# Patient Record
Sex: Male | Born: 1970 | Race: Black or African American | Hispanic: No | Marital: Single | Smoking: Current every day smoker
Health system: Southern US, Community
[De-identification: ages and names within clinical notes are randomized; demographics above are authoritative.]

## PROBLEM LIST (undated history)

## (undated) DIAGNOSIS — D7581 Myelofibrosis: Secondary | ICD-10-CM

## (undated) DIAGNOSIS — D47Z9 Other specified neoplasms of uncertain behavior of lymphoid, hematopoietic and related tissue: Secondary | ICD-10-CM

## (undated) DIAGNOSIS — Z113 Encounter for screening for infections with a predominantly sexual mode of transmission: Secondary | ICD-10-CM

## (undated) DIAGNOSIS — N179 Acute kidney failure, unspecified: Secondary | ICD-10-CM

## (undated) DIAGNOSIS — R05 Cough: Secondary | ICD-10-CM

## (undated) DIAGNOSIS — I10 Essential (primary) hypertension: Secondary | ICD-10-CM

## (undated) DIAGNOSIS — R161 Splenomegaly, not elsewhere classified: Secondary | ICD-10-CM

## (undated) DIAGNOSIS — B009 Herpesviral infection, unspecified: Secondary | ICD-10-CM

## (undated) DIAGNOSIS — Z789 Other specified health status: Secondary | ICD-10-CM

## (undated) DIAGNOSIS — N529 Male erectile dysfunction, unspecified: Secondary | ICD-10-CM

## (undated) DIAGNOSIS — M545 Low back pain: Secondary | ICD-10-CM

## (undated) DIAGNOSIS — S71012A Laceration without foreign body, left hip, initial encounter: Secondary | ICD-10-CM

## (undated) DIAGNOSIS — J45909 Unspecified asthma, uncomplicated: Secondary | ICD-10-CM

## (undated) DIAGNOSIS — M542 Cervicalgia: Secondary | ICD-10-CM

## (undated) DIAGNOSIS — M25512 Pain in left shoulder: Secondary | ICD-10-CM

## (undated) HISTORY — PX: MANDIBLE FRACTURE SURGERY: SHX706

## (undated) HISTORY — DX: Myelofibrosis: D75.81

## (undated) HISTORY — DX: Other specified neoplasms of uncertain behavior of lymphoid, hematopoietic and related tissue: D47.Z9

## (undated) HISTORY — DX: Herpesviral infection, unspecified: B00.9

## (undated) HISTORY — DX: Splenomegaly, not elsewhere classified: R16.1

## (undated) HISTORY — DX: Unspecified asthma, uncomplicated: J45.909

## (undated) HISTORY — DX: Male erectile dysfunction, unspecified: N52.9
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
## 2005-11-17 ENCOUNTER — Emergency Department (HOSPITAL_COMMUNITY): Admission: EM | Admit: 2005-11-17 | Discharge: 2005-11-17 | Payer: Self-pay | Admitting: Emergency Medicine

## 2006-01-05 ENCOUNTER — Emergency Department (HOSPITAL_COMMUNITY): Admission: EM | Admit: 2006-01-05 | Discharge: 2006-01-05 | Payer: Self-pay | Admitting: Emergency Medicine

## 2007-07-12 ENCOUNTER — Emergency Department (HOSPITAL_COMMUNITY): Admission: EM | Admit: 2007-07-12 | Discharge: 2007-07-12 | Payer: Self-pay | Admitting: Emergency Medicine

## 2007-12-18 ENCOUNTER — Emergency Department (HOSPITAL_COMMUNITY): Admission: EM | Admit: 2007-12-18 | Discharge: 2007-12-19 | Payer: Self-pay | Admitting: Emergency Medicine

## 2008-04-29 ENCOUNTER — Emergency Department (HOSPITAL_COMMUNITY): Admission: EM | Admit: 2008-04-29 | Discharge: 2008-04-29 | Payer: Self-pay | Admitting: Emergency Medicine

## 2010-11-06 ENCOUNTER — Emergency Department (HOSPITAL_COMMUNITY)
Admission: EM | Admit: 2010-11-06 | Discharge: 2010-11-07 | Payer: Self-pay | Source: Home / Self Care | Admitting: Emergency Medicine

## 2010-11-07 ENCOUNTER — Ambulatory Visit: Payer: Self-pay | Admitting: Oncology

## 2010-11-07 LAB — CBC
HCT: 46.6 % (ref 39.0–52.0)
Hemoglobin: 15 g/dL (ref 13.0–17.0)
MCH: 25.2 pg — ABNORMAL LOW (ref 26.0–34.0)
MCHC: 32.2 g/dL (ref 30.0–36.0)
MCV: 78.2 fL (ref 78.0–100.0)
Platelets: 393 10*3/uL (ref 150–400)
RBC: 5.96 MIL/uL — ABNORMAL HIGH (ref 4.22–5.81)
RDW: 16.5 % — ABNORMAL HIGH (ref 11.5–15.5)
WBC: 15.7 10*3/uL — ABNORMAL HIGH (ref 4.0–10.5)

## 2010-11-07 LAB — BASIC METABOLIC PANEL
BUN: 6 mg/dL (ref 6–23)
CO2: 28 mEq/L (ref 19–32)
Calcium: 9 mg/dL (ref 8.4–10.5)
Chloride: 103 mEq/L (ref 96–112)
Creatinine, Ser: 1.17 mg/dL (ref 0.4–1.5)
GFR calc Af Amer: >60 mL/min (ref >60)
GFR calc non Af Amer: >60 mL/min (ref >60)
Glucose, Bld: 87 mg/dL (ref 70–99)
Potassium: 3.7 mEq/L (ref 3.5–5.1)
Sodium: 138 mEq/L (ref 135–145)

## 2010-11-07 LAB — URINALYSIS, ROUTINE W REFLEX MICROSCOPIC
Bilirubin Urine: NEGATIVE
Hgb urine dipstick: NEGATIVE
Ketones, ur: NEGATIVE mg/dL
Nitrite: NEGATIVE
Protein, ur: NEGATIVE mg/dL
Specific Gravity, Urine: 1.018 (ref 1.005–1.030)
Urine Glucose, Fasting: NEGATIVE mg/dL
Urobilinogen, UA: 1 mg/dL (ref 0.0–1.0)
pH: 6 (ref 5.0–8.0)

## 2010-11-07 LAB — DIFFERENTIAL
Basophils Absolute: 0.2 10*3/uL — ABNORMAL HIGH (ref 0.0–0.1)
Basophils Relative: 1 % (ref 0–1)
Eosinophils Absolute: 0.3 10*3/uL (ref 0.0–0.7)
Eosinophils Relative: 2 % (ref 0–5)
Lymphocytes Relative: 6 % — ABNORMAL LOW (ref 12–46)
Lymphs Abs: 0.9 10*3/uL (ref 0.7–4.0)
Monocytes Absolute: 0.3 10*3/uL (ref 0.1–1.0)
Monocytes Relative: 2 % — ABNORMAL LOW (ref 3–12)
Neutro Abs: 14 10*3/uL — ABNORMAL HIGH (ref 1.7–7.7)
Neutrophils Relative %: 89 % — ABNORMAL HIGH (ref 43–77)

## 2010-11-07 LAB — HEPATIC FUNCTION PANEL
ALT: 31 U/L (ref 0–53)
AST: 34 U/L (ref 0–37)
Albumin: 3.7 g/dL (ref 3.5–5.2)
Alkaline Phosphatase: 113 U/L (ref 39–117)
Bilirubin, Direct: <0.1 mg/dL (ref 0.0–0.3)
Total Bilirubin: 0.8 mg/dL (ref 0.3–1.2)
Total Protein: 6.8 g/dL (ref 6.0–8.3)

## 2010-11-07 LAB — LIPASE, BLOOD: Lipase: 32 U/L (ref 11–59)

## 2010-11-08 ENCOUNTER — Other Ambulatory Visit
Admission: RE | Admit: 2010-11-08 | Discharge: 2010-11-08 | Payer: Self-pay | Source: Home / Self Care | Attending: Oncology | Admitting: Oncology

## 2010-11-08 LAB — MORPHOLOGY: PLT EST: ADEQUATE

## 2010-11-08 LAB — CBC WITH DIFFERENTIAL/PLATELET
BASO%: 0.1 % (ref 0.0–2.0)
Basophils Absolute: 0 10*3/uL (ref 0.0–0.1)
EOS%: 1.2 % (ref 0.0–7.0)
Eosinophils Absolute: 0.2 10*3/uL (ref 0.0–0.5)
HCT: 45.6 % (ref 38.4–49.9)
HGB: 14.7 g/dL (ref 13.0–17.1)
LYMPH%: 5.3 % — ABNORMAL LOW (ref 14.0–49.0)
MCH: 25.1 pg — ABNORMAL LOW (ref 27.2–33.4)
MCHC: 32.2 g/dL (ref 32.0–36.0)
MCV: 78.1 fL — ABNORMAL LOW (ref 79.3–98.0)
MONO#: 0.1 10*3/uL (ref 0.1–0.9)
MONO%: 0.8 % (ref 0.0–14.0)
NEUT#: 12 10*3/uL — ABNORMAL HIGH (ref 1.5–6.5)
NEUT%: 92.6 % — ABNORMAL HIGH (ref 39.0–75.0)
Platelets: 405 10*3/uL — ABNORMAL HIGH (ref 140–400)
RBC: 5.83 10*6/uL — ABNORMAL HIGH (ref 4.20–5.82)
RDW: 16.7 % — ABNORMAL HIGH (ref 11.0–14.6)
WBC: 13 10*3/uL — ABNORMAL HIGH (ref 4.0–10.3)
lymph#: 0.7 10*3/uL — ABNORMAL LOW (ref 0.9–3.3)

## 2010-11-08 LAB — CHCC SMEAR

## 2010-11-09 LAB — PATHOLOGIST SMEAR REVIEW

## 2010-11-09 LAB — FLOW CYTOMETRY

## 2010-11-09 LAB — MONONUCLEOSIS SCREEN: Mono Screen: NEGATIVE

## 2010-11-10 LAB — COMPREHENSIVE METABOLIC PANEL
ALT: 25 U/L (ref 0–53)
AST: 22 U/L (ref 0–37)
Albumin: 4.2 g/dL (ref 3.5–5.2)
Alkaline Phosphatase: 112 U/L (ref 39–117)
BUN: 9 mg/dL (ref 6–23)
CO2: 29 mEq/L (ref 19–32)
Calcium: 8.9 mg/dL (ref 8.4–10.5)
Chloride: 103 mEq/L (ref 96–112)
Creatinine, Ser: 1.24 mg/dL (ref 0.40–1.50)
Glucose, Bld: 121 mg/dL — ABNORMAL HIGH (ref 70–99)
Potassium: 3.7 mEq/L (ref 3.5–5.3)
Sodium: 140 mEq/L (ref 135–145)
Total Bilirubin: 0.7 mg/dL (ref 0.3–1.2)
Total Protein: 6.9 g/dL (ref 6.0–8.3)

## 2010-11-10 LAB — HEPATITIS B SURFACE ANTIBODY,QUALITATIVE: Hep B S Ab: NEGATIVE

## 2010-11-10 LAB — CYTOMEGALOVIRUS ANTIBODY, IGG: Cytomegalovirus Ab-IgG: 0.74 (ref <0.90)

## 2010-11-10 LAB — CMV IGM: CMV IgM: 0.22 (ref <0.90)

## 2010-11-10 LAB — PROTEIN ELECTROPHORESIS, SERUM
Albumin ELP: 58.7 % (ref 55.8–66.1)
Alpha-1-Globulin: 5 % — ABNORMAL HIGH (ref 2.9–4.9)
Alpha-2-Globulin: 8.5 % (ref 7.1–11.8)
Beta 2: 5.1 % (ref 3.2–6.5)
Beta Globulin: 6.2 % (ref 4.7–7.2)
Gamma Globulin: 16.5 % (ref 11.1–18.8)
Total Protein, Serum Electrophoresis: 6.9 g/dL (ref 6.0–8.3)

## 2010-11-10 LAB — HIV ANTIBODY (ROUTINE TESTING W REFLEX): HIV: NONREACTIVE

## 2010-11-10 LAB — ANGIOTENSIN CONVERTING ENZYME: Angiotensin 1 CE: 40 U/L (ref 8–52)

## 2010-11-10 LAB — LACTATE DEHYDROGENASE: LDH: 904 U/L — ABNORMAL HIGH (ref 94–250)

## 2010-11-10 LAB — HEPATITIS B CORE ANTIBODY, TOTAL: Hep B Core Total Ab: NEGATIVE

## 2010-11-10 LAB — HEPATITIS B CORE ANTIBODY, IGM: Hep B C IgM: NEGATIVE

## 2010-11-10 LAB — HEPATITIS C ANTIBODY: HCV Ab: NEGATIVE

## 2010-11-10 LAB — HEPATITIS C RNA QUANTITATIVE

## 2010-11-10 LAB — HEPATITIS B SURFACE ANTIGEN: Hepatitis B Surface Ag: NEGATIVE

## 2010-11-11 LAB — BCR/ABL (LIO MMD)

## 2010-11-16 ENCOUNTER — Ambulatory Visit (HOSPITAL_COMMUNITY)
Admission: RE | Admit: 2010-11-16 | Discharge: 2010-11-16 | Payer: Self-pay | Source: Home / Self Care | Attending: Oncology | Admitting: Oncology

## 2010-11-16 LAB — CBC
HCT: 43.8 % (ref 39.0–52.0)
Hemoglobin: 14.3 g/dL (ref 13.0–17.0)
MCH: 25.3 pg — ABNORMAL LOW (ref 26.0–34.0)
MCHC: 32.6 g/dL (ref 30.0–36.0)
MCV: 77.5 fL — ABNORMAL LOW (ref 78.0–100.0)
Platelets: 376 10*3/uL (ref 150–400)
RBC: 5.65 MIL/uL (ref 4.22–5.81)
RDW: 16.6 % — ABNORMAL HIGH (ref 11.5–15.5)
WBC: 15.1 10*3/uL — ABNORMAL HIGH (ref 4.0–10.5)

## 2010-11-16 LAB — PROTIME-INR
INR: 1.3 (ref 0.00–1.49)
Prothrombin Time: 16.4 seconds — ABNORMAL HIGH (ref 11.6–15.2)

## 2010-11-16 LAB — APTT: aPTT: 55 seconds — ABNORMAL HIGH (ref 24–37)

## 2010-12-09 ENCOUNTER — Other Ambulatory Visit: Payer: Self-pay | Admitting: Oncology

## 2010-12-09 ENCOUNTER — Encounter (HOSPITAL_BASED_OUTPATIENT_CLINIC_OR_DEPARTMENT_OTHER): Payer: BC Managed Care – PPO | Admitting: Oncology

## 2010-12-09 DIAGNOSIS — R161 Splenomegaly, not elsewhere classified: Secondary | ICD-10-CM

## 2010-12-09 DIAGNOSIS — D7581 Myelofibrosis: Secondary | ICD-10-CM

## 2010-12-09 DIAGNOSIS — D72829 Elevated white blood cell count, unspecified: Secondary | ICD-10-CM

## 2010-12-09 LAB — CBC WITH DIFFERENTIAL/PLATELET
BASO%: 0 % (ref 0.0–2.0)
Basophils Absolute: 0 10*3/uL (ref 0.0–0.1)
EOS%: 0.8 % (ref 0.0–7.0)
HCT: 46.3 % (ref 38.4–49.9)
HGB: 15.1 g/dL (ref 13.0–17.1)
LYMPH%: 5.3 % — ABNORMAL LOW (ref 14.0–49.0)
MCH: 25.1 pg — ABNORMAL LOW (ref 27.2–33.4)
MCHC: 32.7 g/dL (ref 32.0–36.0)
MONO#: 0.2 10*3/uL (ref 0.1–0.9)
NEUT%: 92.8 % — ABNORMAL HIGH (ref 39.0–75.0)
Platelets: 395 10*3/uL (ref 140–400)

## 2010-12-09 LAB — COMPREHENSIVE METABOLIC PANEL
AST: 20 U/L (ref 0–37)
Albumin: 4.3 g/dL (ref 3.5–5.2)
BUN: 8 mg/dL (ref 6–23)
Calcium: 9.1 mg/dL (ref 8.4–10.5)
Chloride: 102 mEq/L (ref 96–112)
Creatinine, Ser: 1.06 mg/dL (ref 0.40–1.50)
Glucose, Bld: 98 mg/dL (ref 70–99)
Potassium: 4 mEq/L (ref 3.5–5.3)

## 2010-12-29 ENCOUNTER — Encounter (HOSPITAL_BASED_OUTPATIENT_CLINIC_OR_DEPARTMENT_OTHER): Payer: BC Managed Care – PPO | Admitting: Oncology

## 2010-12-29 ENCOUNTER — Other Ambulatory Visit: Payer: Self-pay | Admitting: Oncology

## 2010-12-29 DIAGNOSIS — D7581 Myelofibrosis: Secondary | ICD-10-CM

## 2010-12-29 DIAGNOSIS — D72829 Elevated white blood cell count, unspecified: Secondary | ICD-10-CM

## 2010-12-29 DIAGNOSIS — R161 Splenomegaly, not elsewhere classified: Secondary | ICD-10-CM

## 2010-12-29 LAB — CBC WITH DIFFERENTIAL/PLATELET
Basophils Absolute: 0 10*3/uL (ref 0.0–0.1)
Eosinophils Absolute: 0.1 10*3/uL (ref 0.0–0.5)
HCT: 51 % — ABNORMAL HIGH (ref 38.4–49.9)
HGB: 16.4 g/dL (ref 13.0–17.1)
MCH: 24.9 pg — ABNORMAL LOW (ref 27.2–33.4)
MONO#: 0.2 10*3/uL (ref 0.1–0.9)
NEUT#: 12.5 10*3/uL — ABNORMAL HIGH (ref 1.5–6.5)
NEUT%: 90.4 % — ABNORMAL HIGH (ref 39.0–75.0)
RDW: 18 % — ABNORMAL HIGH (ref 11.0–14.6)
lymph#: 1 10*3/uL (ref 0.9–3.3)

## 2010-12-29 LAB — COMPREHENSIVE METABOLIC PANEL
Albumin: 4.5 g/dL (ref 3.5–5.2)
Alkaline Phosphatase: 87 U/L (ref 39–117)
BUN: 13 mg/dL (ref 6–23)
CO2: 26 mEq/L (ref 19–32)
Calcium: 8.9 mg/dL (ref 8.4–10.5)
Chloride: 101 mEq/L (ref 96–112)
Glucose, Bld: 67 mg/dL — ABNORMAL LOW (ref 70–99)
Potassium: 4.5 mEq/L (ref 3.5–5.3)
Sodium: 137 mEq/L (ref 135–145)
Total Protein: 7.2 g/dL (ref 6.0–8.3)

## 2011-01-13 ENCOUNTER — Other Ambulatory Visit: Payer: Self-pay | Admitting: Oncology

## 2011-01-13 ENCOUNTER — Encounter (HOSPITAL_BASED_OUTPATIENT_CLINIC_OR_DEPARTMENT_OTHER): Payer: BC Managed Care – PPO | Admitting: Oncology

## 2011-01-13 DIAGNOSIS — R161 Splenomegaly, not elsewhere classified: Secondary | ICD-10-CM

## 2011-01-13 DIAGNOSIS — D72829 Elevated white blood cell count, unspecified: Secondary | ICD-10-CM

## 2011-01-13 DIAGNOSIS — D7581 Myelofibrosis: Secondary | ICD-10-CM

## 2011-01-13 LAB — TECHNOLOGIST REVIEW

## 2011-01-13 LAB — CBC WITH DIFFERENTIAL/PLATELET
Basophils Absolute: 0.1 10*3/uL (ref 0.0–0.1)
EOS%: 1.4 % (ref 0.0–7.0)
Eosinophils Absolute: 0.1 10*3/uL (ref 0.0–0.5)
HGB: 14.7 g/dL (ref 13.0–17.1)
MCV: 74.7 fL — ABNORMAL LOW (ref 79.3–98.0)
MONO%: 4.5 % (ref 0.0–14.0)
NEUT#: 5.4 10*3/uL (ref 1.5–6.5)
RBC: 6.04 10*6/uL — ABNORMAL HIGH (ref 4.20–5.82)
RDW: 16.2 % — ABNORMAL HIGH (ref 11.0–14.6)
lymph#: 1.1 10*3/uL (ref 0.9–3.3)
nRBC: 0 % (ref 0–0)

## 2011-01-13 LAB — COMPREHENSIVE METABOLIC PANEL
ALT: 50 U/L (ref 0–53)
AST: 32 U/L (ref 0–37)
Chloride: 103 mEq/L (ref 96–112)
Creatinine, Ser: 1.17 mg/dL (ref 0.40–1.50)
Sodium: 138 mEq/L (ref 135–145)
Total Bilirubin: 0.6 mg/dL (ref 0.3–1.2)
Total Protein: 6.9 g/dL (ref 6.0–8.3)

## 2011-02-13 ENCOUNTER — Other Ambulatory Visit: Payer: Self-pay | Admitting: Oncology

## 2011-02-13 ENCOUNTER — Encounter (HOSPITAL_BASED_OUTPATIENT_CLINIC_OR_DEPARTMENT_OTHER): Payer: BC Managed Care – PPO | Admitting: Oncology

## 2011-02-13 DIAGNOSIS — D72829 Elevated white blood cell count, unspecified: Secondary | ICD-10-CM

## 2011-02-13 DIAGNOSIS — R161 Splenomegaly, not elsewhere classified: Secondary | ICD-10-CM

## 2011-02-13 DIAGNOSIS — D7581 Myelofibrosis: Secondary | ICD-10-CM

## 2011-02-13 LAB — COMPREHENSIVE METABOLIC PANEL
Albumin: 4.6 g/dL (ref 3.5–5.2)
Alkaline Phosphatase: 65 U/L (ref 39–117)
BUN: 15 mg/dL (ref 6–23)
Creatinine, Ser: 1.14 mg/dL (ref 0.40–1.50)
Glucose, Bld: 97 mg/dL (ref 70–99)
Total Bilirubin: 1.2 mg/dL (ref 0.3–1.2)

## 2011-02-13 LAB — CBC WITH DIFFERENTIAL/PLATELET
BASO%: 0.5 % (ref 0.0–2.0)
EOS%: 0.9 % (ref 0.0–7.0)
HCT: 37.9 % — ABNORMAL LOW (ref 38.4–49.9)
LYMPH%: 18.3 % (ref 14.0–49.0)
MCH: 24.1 pg — ABNORMAL LOW (ref 27.2–33.4)
MCHC: 33 g/dL (ref 32.0–36.0)
MONO%: 4.1 % (ref 0.0–14.0)
NEUT%: 76.2 % — ABNORMAL HIGH (ref 39.0–75.0)
Platelets: 134 10*3/uL — ABNORMAL LOW (ref 140–400)
RBC: 5.19 10*6/uL (ref 4.20–5.82)

## 2011-02-17 ENCOUNTER — Other Ambulatory Visit: Payer: Self-pay | Admitting: Oncology

## 2011-02-17 DIAGNOSIS — R161 Splenomegaly, not elsewhere classified: Secondary | ICD-10-CM

## 2011-02-22 ENCOUNTER — Ambulatory Visit (HOSPITAL_COMMUNITY)
Admission: RE | Admit: 2011-02-22 | Discharge: 2011-02-22 | Disposition: A | Discharge status: Home / Self Care | Payer: BC Managed Care – PPO | Source: Ambulatory Visit | Attending: Oncology | Admitting: Oncology

## 2011-02-22 ENCOUNTER — Other Ambulatory Visit: Payer: Self-pay | Admitting: Oncology

## 2011-02-22 DIAGNOSIS — R161 Splenomegaly, not elsewhere classified: Secondary | ICD-10-CM | POA: Insufficient documentation

## 2011-02-24 ENCOUNTER — Encounter (HOSPITAL_BASED_OUTPATIENT_CLINIC_OR_DEPARTMENT_OTHER): Payer: BC Managed Care – PPO | Admitting: Oncology

## 2011-02-24 ENCOUNTER — Other Ambulatory Visit: Payer: Self-pay | Admitting: Oncology

## 2011-02-24 DIAGNOSIS — D72829 Elevated white blood cell count, unspecified: Secondary | ICD-10-CM

## 2011-02-24 DIAGNOSIS — R161 Splenomegaly, not elsewhere classified: Secondary | ICD-10-CM

## 2011-02-24 DIAGNOSIS — D7581 Myelofibrosis: Secondary | ICD-10-CM

## 2011-02-24 LAB — CBC WITH DIFFERENTIAL/PLATELET
Basophils Absolute: 0.1 10*3/uL (ref 0.0–0.1)
Eosinophils Absolute: 0 10*3/uL (ref 0.0–0.5)
HCT: 31.8 % — ABNORMAL LOW (ref 38.4–49.9)
HGB: 10.6 g/dL — ABNORMAL LOW (ref 13.0–17.1)
MCV: 75.9 fL — ABNORMAL LOW (ref 79.3–98.0)
MONO%: 3.3 % (ref 0.0–14.0)
NEUT#: 7 10*3/uL — ABNORMAL HIGH (ref 1.5–6.5)
Platelets: 168 10*3/uL (ref 140–400)
RDW: 17.3 % — ABNORMAL HIGH (ref 11.0–14.6)

## 2011-02-24 LAB — COMPREHENSIVE METABOLIC PANEL
Albumin: 4.5 g/dL (ref 3.5–5.2)
Alkaline Phosphatase: 79 U/L (ref 39–117)
BUN: 11 mg/dL (ref 6–23)
Calcium: 8.7 mg/dL (ref 8.4–10.5)
Chloride: 102 mEq/L (ref 96–112)
Glucose, Bld: 92 mg/dL (ref 70–99)
Potassium: 4.1 mEq/L (ref 3.5–5.3)

## 2011-04-03 ENCOUNTER — Emergency Department (HOSPITAL_COMMUNITY)
Admission: EM | Admit: 2011-04-03 | Discharge: 2011-04-03 | Disposition: A | Discharge status: Home / Self Care | Payer: BC Managed Care – PPO | Attending: Emergency Medicine | Admitting: Emergency Medicine

## 2011-04-03 DIAGNOSIS — R21 Rash and other nonspecific skin eruption: Secondary | ICD-10-CM | POA: Insufficient documentation

## 2011-04-03 DIAGNOSIS — B354 Tinea corporis: Secondary | ICD-10-CM | POA: Insufficient documentation

## 2011-04-10 ENCOUNTER — Encounter (HOSPITAL_BASED_OUTPATIENT_CLINIC_OR_DEPARTMENT_OTHER): Payer: BC Managed Care – PPO | Admitting: Oncology

## 2011-04-10 ENCOUNTER — Other Ambulatory Visit: Payer: Self-pay | Admitting: Oncology

## 2011-04-10 DIAGNOSIS — D72829 Elevated white blood cell count, unspecified: Secondary | ICD-10-CM

## 2011-04-10 DIAGNOSIS — D7581 Myelofibrosis: Secondary | ICD-10-CM

## 2011-04-10 DIAGNOSIS — R161 Splenomegaly, not elsewhere classified: Secondary | ICD-10-CM

## 2011-04-10 LAB — COMPREHENSIVE METABOLIC PANEL
ALT: 75 U/L — ABNORMAL HIGH (ref 0–53)
CO2: 28 mEq/L (ref 19–32)
Calcium: 9.2 mg/dL (ref 8.4–10.5)
Chloride: 104 mEq/L (ref 96–112)
Sodium: 138 mEq/L (ref 135–145)
Total Protein: 7.4 g/dL (ref 6.0–8.3)

## 2011-04-10 LAB — CBC WITH DIFFERENTIAL/PLATELET
Basophils Absolute: 0.1 10*3/uL (ref 0.0–0.1)
Eosinophils Absolute: 0.1 10*3/uL (ref 0.0–0.5)
HGB: 10 g/dL — ABNORMAL LOW (ref 13.0–17.1)
MONO#: 0.4 10*3/uL (ref 0.1–0.9)
NEUT#: 6.1 10*3/uL (ref 1.5–6.5)
RDW: 19.9 % — ABNORMAL HIGH (ref 11.0–14.6)
lymph#: 1.2 10*3/uL (ref 0.9–3.3)

## 2011-04-10 LAB — TECHNOLOGIST REVIEW

## 2011-04-24 ENCOUNTER — Other Ambulatory Visit: Payer: Self-pay | Admitting: Oncology

## 2011-04-24 ENCOUNTER — Ambulatory Visit (HOSPITAL_COMMUNITY)
Admission: RE | Admit: 2011-04-24 | Discharge: 2011-04-24 | Disposition: A | Discharge status: Home / Self Care | Payer: BC Managed Care – PPO | Source: Ambulatory Visit | Attending: Oncology | Admitting: Oncology

## 2011-04-24 DIAGNOSIS — R161 Splenomegaly, not elsewhere classified: Secondary | ICD-10-CM

## 2011-04-25 ENCOUNTER — Encounter (HOSPITAL_BASED_OUTPATIENT_CLINIC_OR_DEPARTMENT_OTHER): Payer: BC Managed Care – PPO | Admitting: Oncology

## 2011-04-25 ENCOUNTER — Other Ambulatory Visit: Payer: Self-pay | Admitting: Oncology

## 2011-04-25 DIAGNOSIS — D7581 Myelofibrosis: Secondary | ICD-10-CM

## 2011-04-25 DIAGNOSIS — D72829 Elevated white blood cell count, unspecified: Secondary | ICD-10-CM

## 2011-04-25 LAB — CBC WITH DIFFERENTIAL/PLATELET
BASO%: 0.5 % (ref 0.0–2.0)
EOS%: 1 % (ref 0.0–7.0)
HCT: 32.1 % — ABNORMAL LOW (ref 38.4–49.9)
MCH: 26.6 pg — ABNORMAL LOW (ref 27.2–33.4)
MCHC: 33 g/dL (ref 32.0–36.0)
MCV: 80.5 fL (ref 79.3–98.0)
MONO%: 5.5 % (ref 0.0–14.0)
NEUT%: 75.9 % — ABNORMAL HIGH (ref 39.0–75.0)
RDW: 18.9 % — ABNORMAL HIGH (ref 11.0–14.6)
lymph#: 1.1 10*3/uL (ref 0.9–3.3)

## 2011-04-25 LAB — CHCC SMEAR

## 2011-04-25 LAB — COMPREHENSIVE METABOLIC PANEL
Alkaline Phosphatase: 74 U/L (ref 39–117)
BUN: 12 mg/dL (ref 6–23)
Creatinine, Ser: 1.28 mg/dL (ref 0.50–1.35)
Glucose, Bld: 93 mg/dL (ref 70–99)
Sodium: 139 mEq/L (ref 135–145)
Total Bilirubin: 0.8 mg/dL (ref 0.3–1.2)
Total Protein: 7.4 g/dL (ref 6.0–8.3)

## 2011-05-17 ENCOUNTER — Other Ambulatory Visit (HOSPITAL_COMMUNITY): Payer: BC Managed Care – PPO

## 2011-05-30 ENCOUNTER — Other Ambulatory Visit: Payer: Self-pay | Admitting: Oncology

## 2011-05-30 ENCOUNTER — Encounter (HOSPITAL_BASED_OUTPATIENT_CLINIC_OR_DEPARTMENT_OTHER): Payer: BC Managed Care – PPO | Admitting: Oncology

## 2011-05-30 DIAGNOSIS — D72829 Elevated white blood cell count, unspecified: Secondary | ICD-10-CM

## 2011-05-30 DIAGNOSIS — R161 Splenomegaly, not elsewhere classified: Secondary | ICD-10-CM

## 2011-05-30 DIAGNOSIS — D7581 Myelofibrosis: Secondary | ICD-10-CM

## 2011-05-30 LAB — CBC WITH DIFFERENTIAL/PLATELET
Basophils Absolute: 0 10*3/uL (ref 0.0–0.1)
Eosinophils Absolute: 0 10*3/uL (ref 0.0–0.5)
HCT: 34.6 % — ABNORMAL LOW (ref 38.4–49.9)
HGB: 11.6 g/dL — ABNORMAL LOW (ref 13.0–17.1)
LYMPH%: 21.5 % (ref 14.0–49.0)
MCV: 82.4 fL (ref 79.3–98.0)
MONO#: 0.1 10*3/uL (ref 0.1–0.9)
MONO%: 1.7 % (ref 0.0–14.0)
NEUT#: 4.5 10*3/uL (ref 1.5–6.5)
NEUT%: 75.9 % — ABNORMAL HIGH (ref 39.0–75.0)
Platelets: 150 10*3/uL (ref 140–400)

## 2011-06-27 ENCOUNTER — Other Ambulatory Visit: Payer: Self-pay | Admitting: Oncology

## 2011-06-27 ENCOUNTER — Encounter (HOSPITAL_BASED_OUTPATIENT_CLINIC_OR_DEPARTMENT_OTHER): Payer: BC Managed Care – PPO | Admitting: Oncology

## 2011-06-27 DIAGNOSIS — D696 Thrombocytopenia, unspecified: Secondary | ICD-10-CM

## 2011-06-27 DIAGNOSIS — D7581 Myelofibrosis: Secondary | ICD-10-CM

## 2011-06-27 DIAGNOSIS — D72829 Elevated white blood cell count, unspecified: Secondary | ICD-10-CM

## 2011-06-27 DIAGNOSIS — R161 Splenomegaly, not elsewhere classified: Secondary | ICD-10-CM

## 2011-06-27 LAB — CBC WITH DIFFERENTIAL/PLATELET
BASO%: 1.1 % (ref 0.0–2.0)
HCT: 36.2 % — ABNORMAL LOW (ref 38.4–49.9)
LYMPH%: 26.1 % (ref 14.0–49.0)
MCHC: 33.1 g/dL (ref 32.0–36.0)
MCV: 77.8 fL — ABNORMAL LOW (ref 79.3–98.0)
MONO%: 4.1 % (ref 0.0–14.0)
NEUT%: 67 % (ref 39.0–75.0)
Platelets: 98 10*3/uL — ABNORMAL LOW (ref 140–400)
RBC: 4.65 10*6/uL (ref 4.20–5.82)
nRBC: 1 % — ABNORMAL HIGH (ref 0–0)

## 2011-06-27 LAB — COMPREHENSIVE METABOLIC PANEL
Alkaline Phosphatase: 60 U/L (ref 39–117)
BUN: 14 mg/dL (ref 6–23)
Glucose, Bld: 93 mg/dL (ref 70–99)
Total Bilirubin: 0.5 mg/dL (ref 0.3–1.2)

## 2011-07-10 ENCOUNTER — Encounter (HOSPITAL_BASED_OUTPATIENT_CLINIC_OR_DEPARTMENT_OTHER): Payer: BC Managed Care – PPO | Admitting: Oncology

## 2011-07-10 ENCOUNTER — Other Ambulatory Visit: Payer: Self-pay | Admitting: Medical

## 2011-07-10 DIAGNOSIS — D72829 Elevated white blood cell count, unspecified: Secondary | ICD-10-CM

## 2011-07-10 DIAGNOSIS — D7581 Myelofibrosis: Secondary | ICD-10-CM

## 2011-07-10 DIAGNOSIS — R161 Splenomegaly, not elsewhere classified: Secondary | ICD-10-CM

## 2011-07-10 LAB — CBC WITH DIFFERENTIAL/PLATELET
Basophils Absolute: 0.1 10*3/uL (ref 0.0–0.1)
Eosinophils Absolute: 0.1 10*3/uL (ref 0.0–0.5)
HCT: 34.8 % — ABNORMAL LOW (ref 38.4–49.9)
HGB: 11.6 g/dL — ABNORMAL LOW (ref 13.0–17.1)
LYMPH%: 36.3 % (ref 14.0–49.0)
MCV: 78 fL — ABNORMAL LOW (ref 79.3–98.0)
MONO%: 6.3 % (ref 0.0–14.0)
NEUT#: 3.3 10*3/uL (ref 1.5–6.5)
NEUT%: 53.6 % (ref 39.0–75.0)
Platelets: 130 10*3/uL — ABNORMAL LOW (ref 140–400)

## 2011-07-10 LAB — TECHNOLOGIST REVIEW

## 2011-07-14 LAB — URINALYSIS, ROUTINE W REFLEX MICROSCOPIC
Ketones, ur: 40 — AB
Nitrite: NEGATIVE
Protein, ur: NEGATIVE
pH: 6

## 2011-07-14 LAB — I-STAT 8, (EC8 V) (CONVERTED LAB)
Acid-Base Excess: 2
BUN: 4 — ABNORMAL LOW
Bicarbonate: 27.7 — ABNORMAL HIGH
Chloride: 101
Glucose, Bld: 118 — ABNORMAL HIGH
HCT: 54 — ABNORMAL HIGH
Hemoglobin: 18.4 — ABNORMAL HIGH
Operator id: 196461
Potassium: 3.4 — ABNORMAL LOW
Sodium: 137
TCO2: 29
pCO2, Ven: 47
pH, Ven: 7.378 — ABNORMAL HIGH

## 2011-07-14 LAB — POCT I-STAT CREATININE
Creatinine, Ser: 1.5
Operator id: 196461

## 2011-07-14 LAB — CBC
MCV: 79.8
Platelets: 745 — ABNORMAL HIGH
RBC: 6.18 — ABNORMAL HIGH
WBC: 17.2 — ABNORMAL HIGH

## 2011-07-14 LAB — URINE MICROSCOPIC-ADD ON

## 2011-07-14 LAB — DIFFERENTIAL
Basophils Relative: 0
Eosinophils Absolute: 0.1
Lymphs Abs: 0.7
Monocytes Relative: 1 — ABNORMAL LOW
Neutro Abs: 16.2 — ABNORMAL HIGH
Neutrophils Relative %: 94 — ABNORMAL HIGH

## 2011-07-29 ENCOUNTER — Encounter: Payer: Self-pay | Admitting: Oncology

## 2011-07-29 DIAGNOSIS — R161 Splenomegaly, not elsewhere classified: Secondary | ICD-10-CM | POA: Insufficient documentation

## 2011-07-29 DIAGNOSIS — D471 Chronic myeloproliferative disease: Secondary | ICD-10-CM | POA: Insufficient documentation

## 2011-07-31 ENCOUNTER — Encounter (HOSPITAL_BASED_OUTPATIENT_CLINIC_OR_DEPARTMENT_OTHER): Payer: BC Managed Care – PPO | Admitting: Oncology

## 2011-07-31 ENCOUNTER — Other Ambulatory Visit: Payer: Self-pay | Admitting: Oncology

## 2011-07-31 DIAGNOSIS — R161 Splenomegaly, not elsewhere classified: Secondary | ICD-10-CM

## 2011-07-31 DIAGNOSIS — D7581 Myelofibrosis: Secondary | ICD-10-CM

## 2011-07-31 DIAGNOSIS — D72829 Elevated white blood cell count, unspecified: Secondary | ICD-10-CM

## 2011-07-31 LAB — CBC WITH DIFFERENTIAL/PLATELET
Basophils Absolute: 0.1 10*3/uL (ref 0.0–0.1)
Eosinophils Absolute: 0.1 10*3/uL (ref 0.0–0.5)
HGB: 10.5 g/dL — ABNORMAL LOW (ref 13.0–17.1)
LYMPH%: 30.8 % (ref 14.0–49.0)
MONO#: 0.3 10*3/uL (ref 0.1–0.9)
NEUT#: 3.3 10*3/uL (ref 1.5–6.5)
Platelets: 92 10*3/uL — ABNORMAL LOW (ref 140–400)
RBC: 4 10*6/uL — ABNORMAL LOW (ref 4.20–5.82)
RDW: 19.5 % — ABNORMAL HIGH (ref 11.0–14.6)
WBC: 5.4 10*3/uL (ref 4.0–10.3)
nRBC: 3 % — ABNORMAL HIGH (ref 0–0)

## 2011-07-31 LAB — TECHNOLOGIST REVIEW

## 2011-08-03 LAB — CBC
HCT: 50.3
MCHC: 32.9
MCV: 79.7
Platelets: 780 — ABNORMAL HIGH
RDW: 14.8 — ABNORMAL HIGH

## 2011-08-03 LAB — DIFFERENTIAL
Basophils Absolute: 0
Lymphs Abs: 0.7
Monocytes Absolute: 0.3

## 2011-08-03 LAB — I-STAT 8, (EC8 V) (CONVERTED LAB)
BUN: 9
Bicarbonate: 28.4 — ABNORMAL HIGH
Glucose, Bld: 122 — ABNORMAL HIGH
Hemoglobin: 18.7 — ABNORMAL HIGH
Sodium: 136

## 2011-08-03 LAB — URINALYSIS, ROUTINE W REFLEX MICROSCOPIC
Nitrite: NEGATIVE
Specific Gravity, Urine: 1.023
Urobilinogen, UA: 0.2

## 2011-08-03 LAB — URINE MICROSCOPIC-ADD ON

## 2011-08-18 ENCOUNTER — Encounter: Payer: Self-pay | Admitting: *Deleted

## 2011-08-30 ENCOUNTER — Telehealth: Payer: Self-pay | Admitting: Oncology

## 2011-08-30 ENCOUNTER — Other Ambulatory Visit: Payer: Self-pay | Admitting: Oncology

## 2011-08-30 ENCOUNTER — Ambulatory Visit (HOSPITAL_BASED_OUTPATIENT_CLINIC_OR_DEPARTMENT_OTHER): Payer: BC Managed Care – PPO | Admitting: Oncology

## 2011-08-30 ENCOUNTER — Other Ambulatory Visit (HOSPITAL_BASED_OUTPATIENT_CLINIC_OR_DEPARTMENT_OTHER): Payer: BC Managed Care – PPO | Admitting: Lab

## 2011-08-30 VITALS — BP 129/74 | HR 89 | Temp 98.3°F | Wt 226.5 lb

## 2011-08-30 DIAGNOSIS — D7581 Myelofibrosis: Secondary | ICD-10-CM

## 2011-08-30 DIAGNOSIS — D649 Anemia, unspecified: Secondary | ICD-10-CM

## 2011-08-30 DIAGNOSIS — R161 Splenomegaly, not elsewhere classified: Secondary | ICD-10-CM

## 2011-08-30 LAB — CBC WITH DIFFERENTIAL/PLATELET
Eosinophils Absolute: 0.1 10*3/uL (ref 0.0–0.5)
LYMPH%: 21.1 % (ref 14.0–49.0)
MONO#: 0.2 10*3/uL (ref 0.1–0.9)
NEUT#: 3.1 10*3/uL (ref 1.5–6.5)
Platelets: 91 10*3/uL — ABNORMAL LOW (ref 140–400)
RBC: 3.56 10*6/uL — ABNORMAL LOW (ref 4.20–5.82)
RDW: 19.4 % — ABNORMAL HIGH (ref 11.0–14.6)
WBC: 4.4 10*3/uL (ref 4.0–10.3)
lymph#: 0.9 10*3/uL (ref 0.9–3.3)
nRBC: 7 % — ABNORMAL HIGH (ref 0–0)

## 2011-08-30 LAB — TECHNOLOGIST REVIEW

## 2011-08-30 LAB — COMPREHENSIVE METABOLIC PANEL
ALT: 62 U/L — ABNORMAL HIGH (ref 0–53)
AST: 41 U/L — ABNORMAL HIGH (ref 0–37)
Albumin: 4.8 g/dL (ref 3.5–5.2)
Calcium: 9.1 mg/dL (ref 8.4–10.5)
Chloride: 103 mEq/L (ref 96–112)
Potassium: 3.9 mEq/L (ref 3.5–5.3)
Sodium: 138 mEq/L (ref 135–145)
Total Protein: 7.6 g/dL (ref 6.0–8.3)

## 2011-08-30 NOTE — Progress Notes (Signed)
[facility] OFFICE PROGRESS NOTE  DIAGNOSIS:  High-grade primary myelofibrosis; IPSS score of 1 (given symptoms) or low risk; cytogentics normal 46, XY.  CURRENT THERAPY:  Pending allogeneic hematopoietic stem cell transplant.  He started [place] in Feb 2012.   INTERVAL HISTORY: Sean Walter 40 y.o. male returns for her followup. He reports that his left upper quadrant and lower back pain has been quite under control. He takes 5/325 mg tablet of Vicodin 2 tablets twice a day prn pain.  He has not been working out much because of the concern for a splenic rupture and thus has been gaining some weight.  He denied any anorexia weight loss drenching night sweats, chest pain, abdominal pain, melena, hematochezia, hematuria.  He would like to go back to work part-time with low activity level if possible.  MEDICAL HISTORY: Past Medical History  Diagnosis Date  . Primary myelofibrosis   . Asthma, cold induced   . Spleen enlargement     MEDICATIONS:  Current Outpatient Prescriptions  Medication Sig Dispense Refill  . acyclovir (ZOVIRAX) 400 MG tablet Take 400 mg by mouth as directed.        [name] Kitchen albuterol (PROVENTIL,VENTOLIN) 2 MG/5ML syrup Take 2 mg by mouth as needed.        [name] Kitchen HYDROcodone-acetaminophen (NORCO) 5-325 MG per tablet Take 1 tablet by mouth every 8 (eight) hours as needed.        . ruxolitinib phosphate (JAKAFI) 25 MG tablet Take 25 mg by mouth 2 (two) times daily.          REVIEW OF SYSTEMS:  Pertinent items are noted in HPI.   PHYSICAL EXAMINATION: ECOG PERFORMANCE STATUS: 0  Filed Vitals:   08/30/11 0920  BP: 129/74  Pulse: 89  Temp: 98.3 F (36.8 C)    General:  well-nourished in no acute distress.  Eyes:  no scleral icterus.  ENT:  There were no oropharyngeal lesions.  Neck was without thyromegaly.  Lymphatics:  Negative cervical, supraclavicular or axillary adenopathy.  Respiratory: lungs were clear bilaterally without wheezing or crackles.   Cardiovascular:  Regular rate and rhythm, S1/S2, without murmur, rub or gallop.  There was no pedal edema.  GI:  abdomen was soft, flat, nontender, nondistended, without organomegaly. I could not appreciate his spleen on exam.  Muscoloskeletal:  no spinal tenderness of palpation of vertebral spine.  Skin exam was without echymosis, petichae.  Neuro exam was nonfocal.  Patient was able to get on and off exam table without assistance.  Gait was normal.  Patient was alerted and oriented.  Attention was good.   Language was appropriate.  Mood was normal without depression.  Speech was not pressured.  Thought content was not tangential.     LABORATORY DATA: Results for orders placed in visit on 08/30/11 (from the past 48 hour(s))  CBC WITH DIFFERENTIAL     Status: Abnormal   Collection Time   08/30/11  8:59 AM      Component Value Range Comment   WBC 4.4  4.0 - 10.3 (10e3/uL)    NEUT# 3.1  1.5 - 6.5 (10e3/uL)    HGB 9.7 (*) 13.0 - 17.1 (g/dL)    HCT 40.9 (*) 81.1 - 49.9 (%)    Platelets 91 (*) 140 - 400 (10e3/uL)    MCV 83.7  79.3 - 98.0 (fL)    MCH 27.2  27.2 - 33.4 (pg)    MCHC 32.6  32.0 - 36.0 (g/dL)    RBC 9.14 (*)  4.20 - 5.82 (10e6/uL)    RDW 19.4 (*) 11.0 - 14.6 (%)    lymph# 0.9  0.9 - 3.3 (10e3/uL)    MONO# 0.2  0.1 - 0.9 (10e3/uL)    Eosinophils Absolute 0.1  0.0 - 0.5 (10e3/uL)    Basophils Absolute 0.1  0.0 - 0.1 (10e3/uL)    NEUT% 70.4  39.0 - 75.0 (%)    LYMPH% 21.1  14.0 - 49.0 (%)    MONO% 4.6  0.0 - 14.0 (%)    EOS% 1.4  0.0 - 7.0 (%)    BASO% 2.5 (*) 0.0 - 2.0 (%)    nRBC 7 (*) 0 - 0 (%)   TECHNOLOGIST REVIEW     Status: Normal   Collection Time   08/30/11  8:59 AM      Component Value Range Comment   Technologist Review Metas and Myelocytes present        CMET:  Pending.  RADIOGRAPHIC STUDIES:  none   ASSESSMENT AND PLAN:  1.  Myelofibrosis:  I discussed with Mr. Hilda Lias that he has been tolerating Jakafi well without major problem grade he never has a skin rash  on his neck as before. Earvin Hansen has been working well for him. It has decreased his splenomegaly,  improved his abnormal pain and his overall well-being.  I recommended to continue with Jakafi 25 mg by mouth twice a day at this time however in the future it should be decreased down to 20mg  by mouth twice a day given his recent slight worsening of anemia. 2.  Anemia:  Most likely due to the higher dose outfits Jakafi. I have low suspicions for disease progression as he does not have pancytopenia. He is active bleeding. His Hgb is above 7 which is my threshold for transfusion. I recommend to continue observation and once he finished with his current bottle of Jakafi the dose we decreased down to Jakafi 20 mg by mouth twice a day. 3.  Social:  He would like to go back to work. He would like to resume working out again. I advised him to work out and be active however to minimize the risk of splenic rupture and avoid injuing the left upper quadrant. I would like to go ahead and order today a followup abdominal  ultrasound to document the size of the spleen. 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  4.  Follow up:  CBC only in 6 weeks and in 3 months. I will see him in about 4-5 months.    ______________________________

## 2011-08-30 NOTE — Telephone Encounter (Signed)
gv pt appts for dec thru feb including [place] for 11/12 @ 8:30 am @ [facility]

## 2011-09-04 ENCOUNTER — Encounter: Payer: Self-pay | Admitting: Oncology

## 2011-09-04 ENCOUNTER — Telehealth: Payer: Self-pay | Admitting: *Deleted

## 2011-09-04 ENCOUNTER — Ambulatory Visit (HOSPITAL_COMMUNITY)
Admission: RE | Admit: 2011-09-04 | Discharge: 2011-09-04 | Disposition: A | Discharge status: Home / Self Care | Payer: BC Managed Care – PPO | Source: Ambulatory Visit | Attending: Oncology | Admitting: Oncology

## 2011-09-04 DIAGNOSIS — R161 Splenomegaly, not elsewhere classified: Secondary | ICD-10-CM | POA: Insufficient documentation

## 2011-09-04 DIAGNOSIS — D7581 Myelofibrosis: Secondary | ICD-10-CM

## 2011-09-04 DIAGNOSIS — D739 Disease of spleen, unspecified: Secondary | ICD-10-CM | POA: Insufficient documentation

## 2011-09-04 MED ORDER — RUXOLITINIB PHOSPHATE 20 MG PO TABS: 20.0000 mg | ORAL_TABLET | Freq: Two times a day (BID) | ORAL | Status: DC

## 2011-09-04 NOTE — Telephone Encounter (Signed)
Called in refill on Jakafi to [facility] [phone] fax#[phone]  w/ reduced dose to 20mg  BID. Spoke with [employer].

## 2011-09-04 NOTE — Telephone Encounter (Signed)
Message copied by Wende Mott on Mon Sep 04, 2011  4:11 PM ------      Message from: HA, Raliegh Ip T      Created: Mon Sep 04, 2011  1:08 PM       Please call pt.  His spleen is now 15cm.  OK to go back to work part time first with light duty.  Please ask him to give me whichever paper work I need to fill out.  If he wants a letter to go back to work, let me know.  Thanks.

## 2011-09-04 NOTE — Telephone Encounter (Signed)
Called pt w/ results of his U/S per Dr. Gaylyn Rong and that he may go back to work part time per Dr. Gaylyn Rong.  Pt will bring in form for Dr. Gaylyn Rong to complete/sign and requests he have "no restrictions" listed.  Pt states he will not be able to go back to his job if any restrictions are listed.  Pt states he is due for his Jakafi refill and understands his dose is being reduced to 20mg  daily. This RN will call pharmacy w/ dose reduction for this refill.

## 2011-09-05 NOTE — Telephone Encounter (Signed)
Per Dr. Gaylyn Rong pt should be restricted to lifting 20lbs or less when he returns to work in his opinion.  Called pt to inform him of Dr. Lodema Pilot restrictions.  Pt asking why Dr. Gaylyn Rong putting these restrictions in effect and voiced his concerns that he says he will not be allowed to return to work at [employer] w/ any restrictions.  Explained to pt this is Dr. Lodema Pilot medical opinion that it is not safe for pt to lift more than 20lbs at this time.  Instructed pt to speak with Dr. Gaylyn Rong regarding this and he said he would speak with somebody about it and hung up.

## 2011-09-12 ENCOUNTER — Telehealth: Payer: Self-pay | Admitting: *Deleted

## 2011-09-12 ENCOUNTER — Other Ambulatory Visit: Payer: Self-pay | Admitting: Oncology

## 2011-09-12 MED ORDER — HYDROCODONE-ACETAMINOPHEN 5-325 MG PO TABS: 1.0000 | ORAL_TABLET | Freq: Three times a day (TID) | ORAL | Status: DC | PRN

## 2011-09-12 NOTE — Telephone Encounter (Signed)
Patient called requesting refill on norco 5/325 mg.  Last filled number ninety on 08-14-11.  [facility]ses [facility] [address]

## 2011-09-18 ENCOUNTER — Emergency Department (HOSPITAL_COMMUNITY)
Admission: EM | Admit: 2011-09-18 | Discharge: 2011-09-18 | Discharge status: Against Medical Advice | Payer: BC Managed Care – PPO | Attending: Emergency Medicine | Admitting: Emergency Medicine

## 2011-09-18 ENCOUNTER — Encounter (HOSPITAL_COMMUNITY): Payer: Self-pay

## 2011-09-18 DIAGNOSIS — R21 Rash and other nonspecific skin eruption: Secondary | ICD-10-CM | POA: Insufficient documentation

## 2011-09-18 NOTE — ED Notes (Signed)
Rash all over body earlier,.9:49 AM just on buttocks

## 2011-10-09 ENCOUNTER — Telehealth: Payer: Self-pay | Admitting: *Deleted

## 2011-10-09 ENCOUNTER — Other Ambulatory Visit: Payer: Self-pay | Admitting: Oncology

## 2011-10-09 ENCOUNTER — Other Ambulatory Visit: Payer: Self-pay | Admitting: *Deleted

## 2011-10-09 ENCOUNTER — Other Ambulatory Visit (HOSPITAL_BASED_OUTPATIENT_CLINIC_OR_DEPARTMENT_OTHER): Payer: BC Managed Care – PPO | Admitting: Lab

## 2011-10-09 DIAGNOSIS — D7581 Myelofibrosis: Secondary | ICD-10-CM

## 2011-10-09 LAB — CBC WITH DIFFERENTIAL/PLATELET
BASO%: 3.2 % — ABNORMAL HIGH (ref 0.0–2.0)
EOS%: 1.8 % (ref 0.0–7.0)
MCH: 27.8 pg (ref 27.2–33.4)
MCHC: 32.9 g/dL (ref 32.0–36.0)
MONO%: 7.2 % (ref 0.0–14.0)
RDW: 16.3 % — ABNORMAL HIGH (ref 11.0–14.6)
lymph#: 1.3 10*3/uL (ref 0.9–3.3)

## 2011-10-09 LAB — TECHNOLOGIST REVIEW

## 2011-10-09 MED ORDER — HYDROCODONE-ACETAMINOPHEN 5-325 MG PO TABS: 1.0000 | ORAL_TABLET | Freq: Three times a day (TID) | ORAL | Status: DC | PRN

## 2011-10-09 NOTE — Telephone Encounter (Signed)
Called pt to inform him that there would be a dose change on his jakafi due to plt results per MD request. Per the pt [facility] had already called to ship his current dose of jakafi at 25 mg. Pt also states he needs a refill on his norco pain meds. Called [facility] to change dose on jakafi to 10 mg BID before shipping and that was done with Reuel Boom, Pharmacist. Signed script for norco [facility] [address] as requested by pt. Returned call to pt at home to give above information. Verbalized understanding.

## 2011-10-11 ENCOUNTER — Other Ambulatory Visit: Payer: Self-pay | Admitting: *Deleted

## 2011-10-11 DIAGNOSIS — D7581 Myelofibrosis: Secondary | ICD-10-CM

## 2011-10-11 MED ORDER — RUXOLITINIB PHOSPHATE 10 MG PO TABS: 10.0000 mg | ORAL_TABLET | Freq: Two times a day (BID) | ORAL | Status: DC

## 2011-10-31 IMAGING — CT CT BIOPSY
1 of 5 series · 13 of 32 positions shown, 19 images · non-contrast
Comparison: none

CLINICAL DATA: Splenomegaly.  The patient requires bone marrow
biopsy.

[Series 2: pelvis bone · axial · 0.53mm/px · z∈[-54,+6]mm · 13 of 16 slices shown, 19 images]
[im 2/16  soft-tissue]
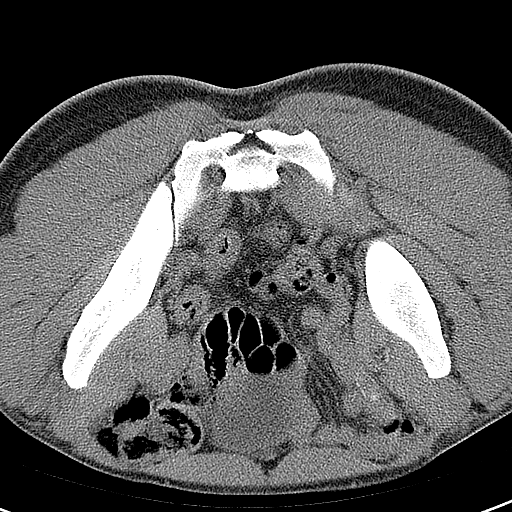
[im 2/16  bone]
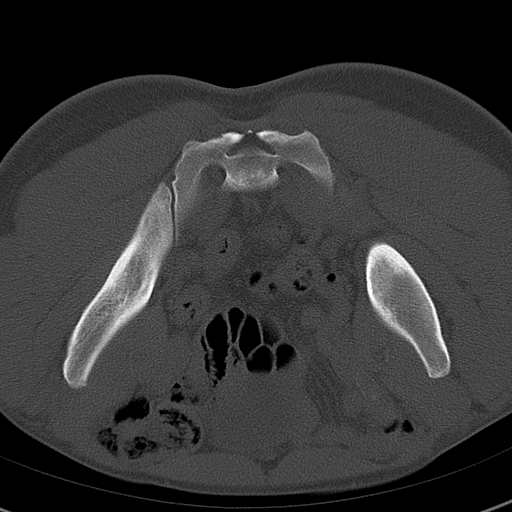
[im 3/16  soft-tissue]
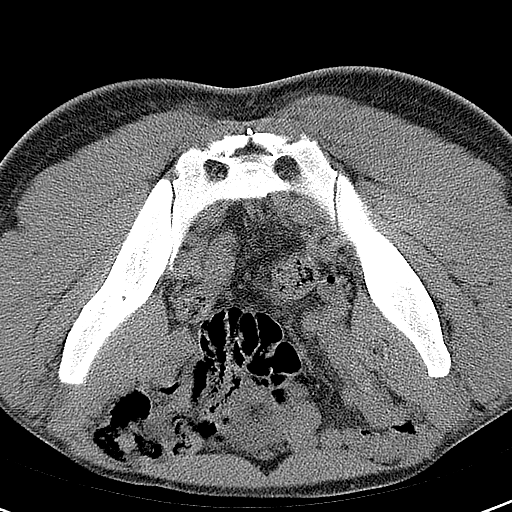
[im 4/16  soft-tissue]
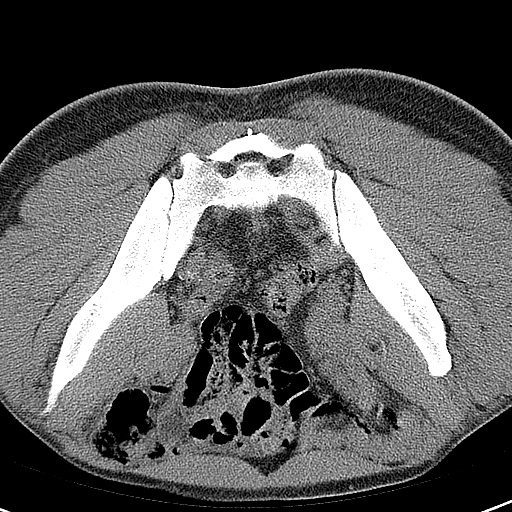
[im 5/16  soft-tissue]
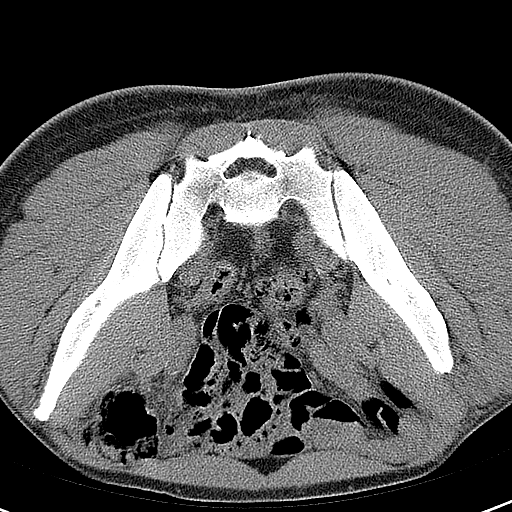
[im 6/16  soft-tissue]
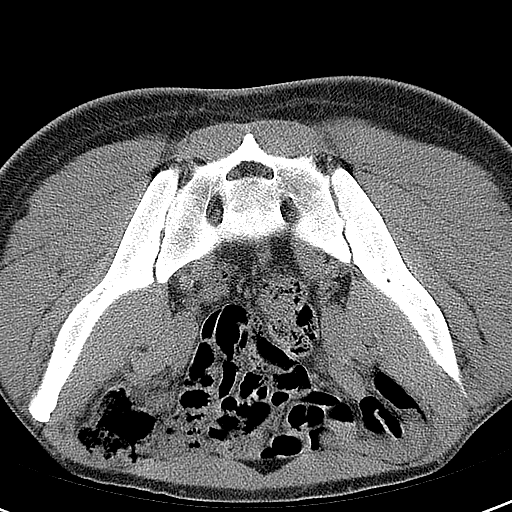
[im 7/16  soft-tissue]
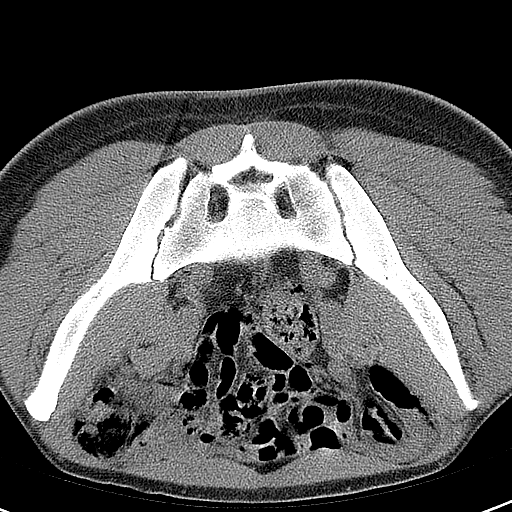
[im 8/16  soft-tissue]
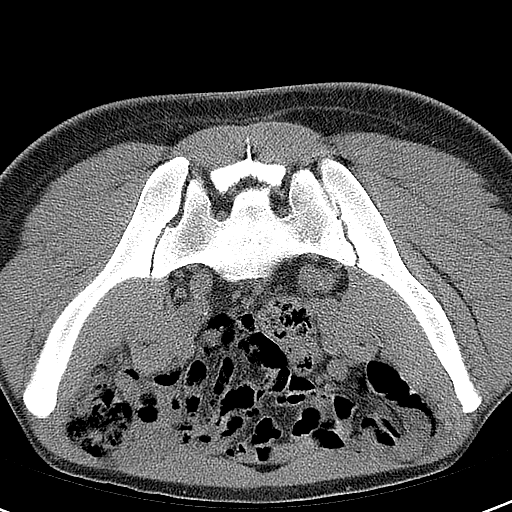
[im 9/16  soft-tissue]
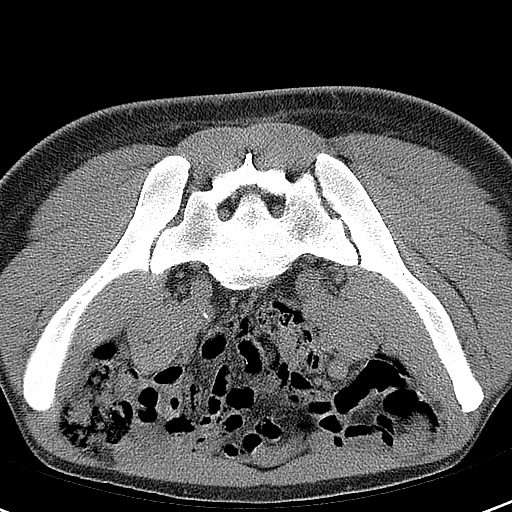
[im 10/16  soft-tissue]
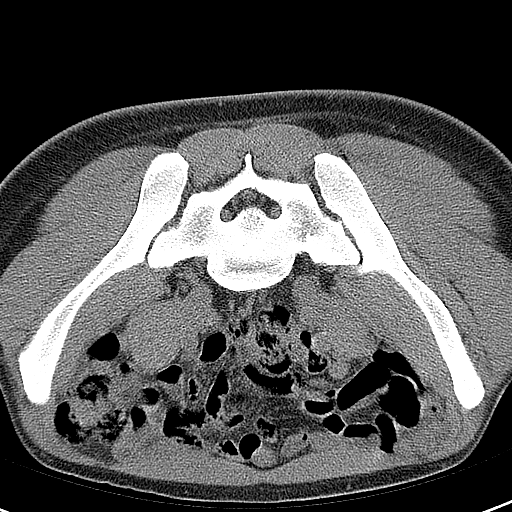
[im 10/16  bone]
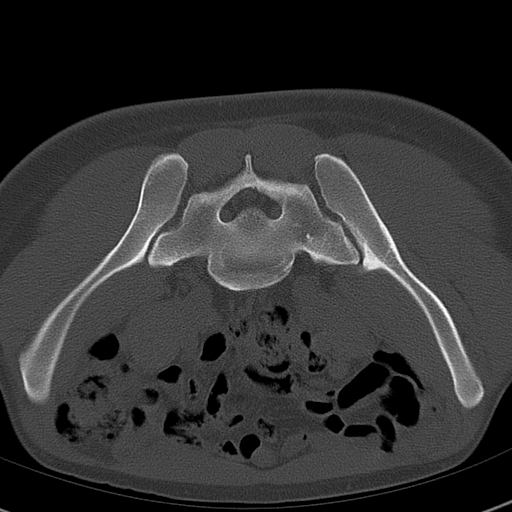
[im 11/16  soft-tissue]
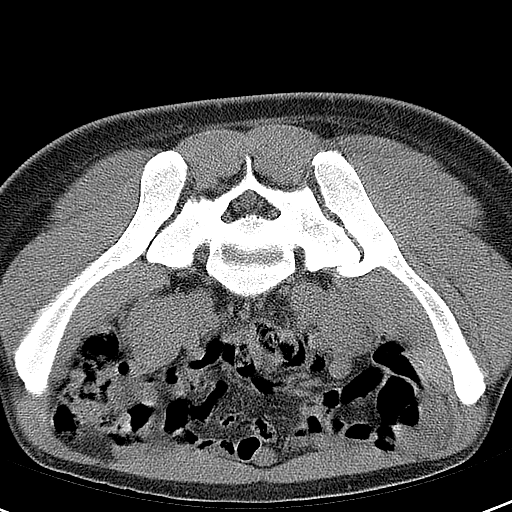
[im 11/16  lung]
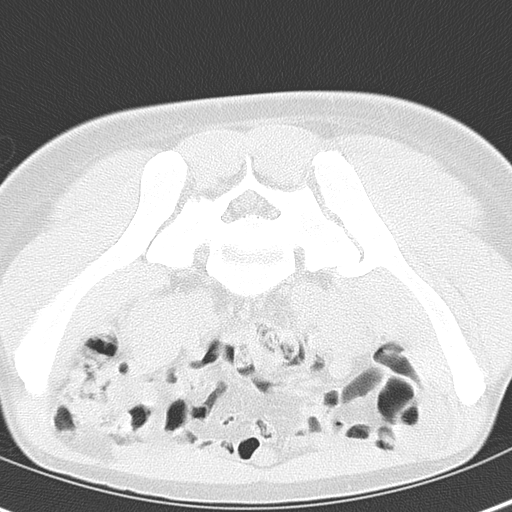
[im 12/16  soft-tissue]
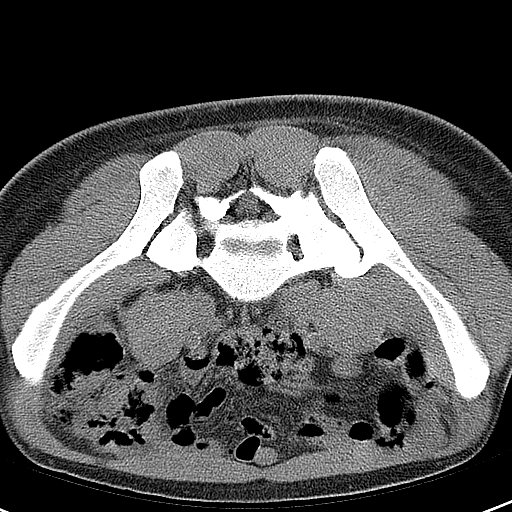
[im 12/16  lung]
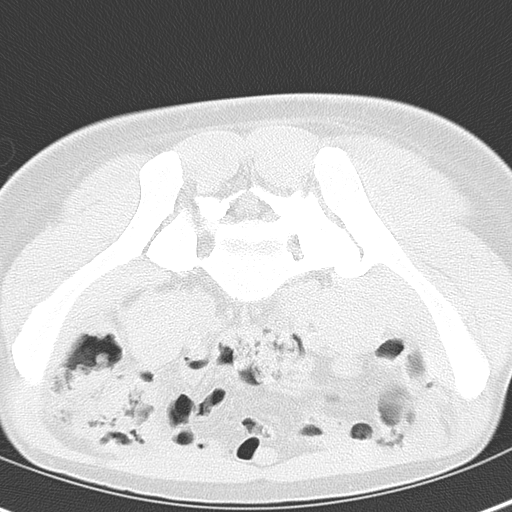
[im 13/16  soft-tissue]
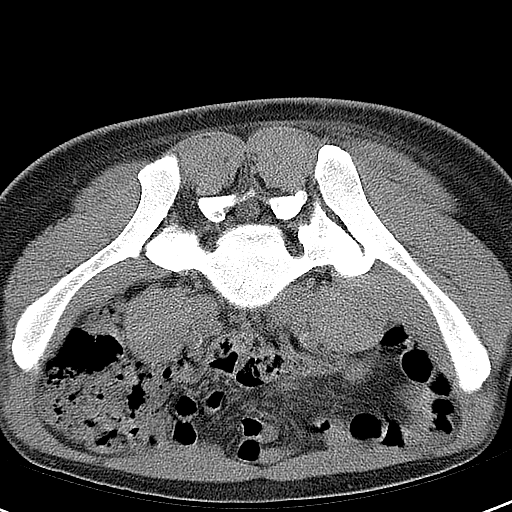
[im 13/16  lung]
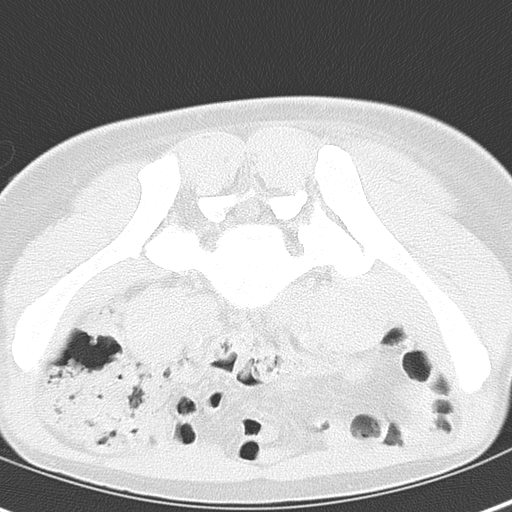
[im 14/16  soft-tissue]
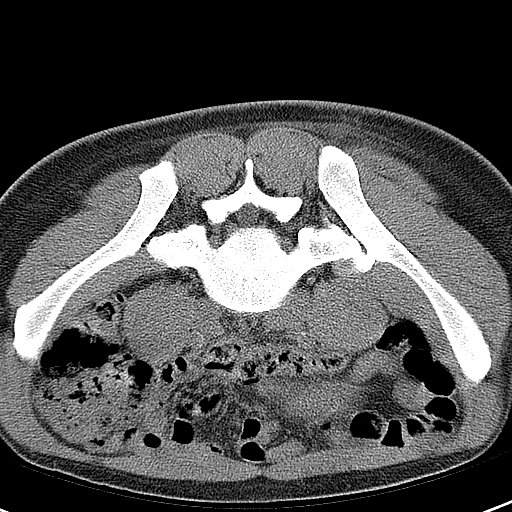
[im 14/16  lung]
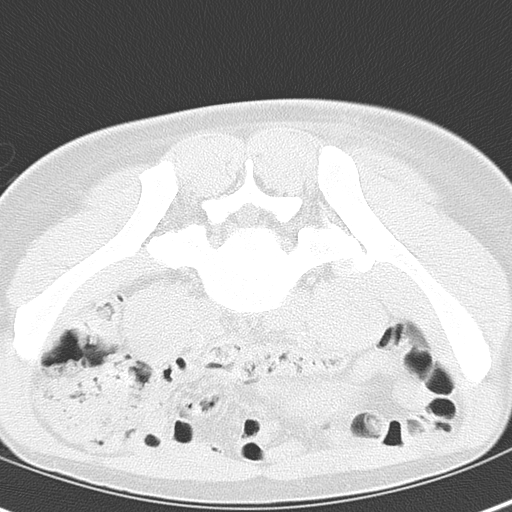

[13 of 32 positions shown; findings below may reference images not displayed]

CT GUIDED ASPIRATE AND CORE BIOPSY OF RIGHT ILIAC BONE MARROW

Sedation: Versed 4.0 mg IV, Fentanyl 200 mcg IV

Total Moderate Sedation Time: 29 minutes.

Procedure:  The procedure risks, benefits, and alternatives were
explained to the patient.  Questions regarding the procedure were
encouraged and answered.  The patient understands and consents to
the procedure.

The right gluteal region was prepped with Betadine.  Sterile gown
and sterile gloves were used for the procedure.  Local anesthesia
was provided with 1% Lidocaine.

Under CT guidance, an 11 gauge bone cutting needle was advanced
from a posterior approach into the right iliac bone.  Needle
positioning was confirmed with CT.  Initial non heparinized and
heparinized aspirate samples were obtained of bone marrow.

Core biopsy was performed with coaxial placement of a 14 gauge core
biopsy needle.  Multiple core samples of the marrow were obtained.

Complications: None
FINDINGS: Inspection of initial aspirate did reveal visible
particles.  Intact core biopsy samples were able to be obtained.
IMPRESSION: CT guided bone marrow biopsy of right posterior iliac bone with
both aspirate and core samples obtained.

## 2011-11-09 ENCOUNTER — Other Ambulatory Visit: Payer: Self-pay | Admitting: *Deleted

## 2011-11-09 DIAGNOSIS — D7581 Myelofibrosis: Secondary | ICD-10-CM

## 2011-11-09 MED ORDER — HYDROCODONE-ACETAMINOPHEN 5-325 MG PO TABS: 1.0000 | ORAL_TABLET | Freq: Three times a day (TID) | ORAL | Status: DC | PRN

## 2011-11-12 ENCOUNTER — Emergency Department (HOSPITAL_COMMUNITY)
Admission: EM | Admit: 2011-11-12 | Discharge: 2011-11-12 | Disposition: A | Discharge status: Home / Self Care | Payer: BC Managed Care – PPO | Attending: Emergency Medicine | Admitting: Emergency Medicine

## 2011-11-12 ENCOUNTER — Encounter (HOSPITAL_COMMUNITY): Payer: Self-pay | Admitting: *Deleted

## 2011-11-12 DIAGNOSIS — J45909 Unspecified asthma, uncomplicated: Secondary | ICD-10-CM | POA: Insufficient documentation

## 2011-11-12 DIAGNOSIS — N492 Inflammatory disorders of scrotum: Secondary | ICD-10-CM

## 2011-11-12 DIAGNOSIS — N5089 Other specified disorders of the male genital organs: Secondary | ICD-10-CM | POA: Insufficient documentation

## 2011-11-12 DIAGNOSIS — N509 Disorder of male genital organs, unspecified: Secondary | ICD-10-CM | POA: Insufficient documentation

## 2011-11-12 DIAGNOSIS — N498 Inflammatory disorders of other specified male genital organs: Secondary | ICD-10-CM | POA: Insufficient documentation

## 2011-11-12 NOTE — ED Notes (Signed)
Patient with skin abcess

## 2011-11-12 NOTE — ED Provider Notes (Signed)
Medical screening examination/treatment/procedure(s) were performed by non-physician practitioner and as supervising physician I was immediately available for consultation/collaboration.   Makenna Macaluso L Shirlie Enck, MD 11/12/11 0605 

## 2011-11-12 NOTE — ED Notes (Signed)
Patient given discharge paperwork; went over discharge instructions with patient.  Instructed patient to continue taking antibiotic prescription prescribed by PCP, to follow up with PCP on Monday morning, and to return to the ED for new, worsening, or concerning symptoms.

## 2011-11-12 NOTE — ED Provider Notes (Signed)
History     CSN: [id number]  Arrival date & time 11/12/11  0010   First MD Initiated Contact with Patient 11/12/11 [phone]      Chief Complaint  Patient presents with  . Skin Problem  . Recurrent Skin Infections    (Consider location/radiation/quality/duration/timing/severity/associated sxs/prior treatment) HPI Comments: Patient with abscess on the right proximal scrotal wall was seen by his primary care physician yesterday.  Was given antibiotic and ibuprofen and has gotten worse since then.  He is requesting additional evaluation  The history is provided by the patient.    Past Medical History  Diagnosis Date  . Asthma, cold induced   . Spleen enlargement     History reviewed. No pertinent past surgical history.  History reviewed. No pertinent family history.  History  Substance Use Topics  . Smoking status: Passive Smoker  . Smokeless tobacco: Not on file  . Alcohol Use: Yes      Review of Systems  Constitutional: Negative for fever and activity change.  Genitourinary: Positive for scrotal swelling. Negative for dysuria and testicular pain.  Neurological: Negative for dizziness.    Allergies  Review of patient's allergies indicates no known allergies.  Home Medications   Current Outpatient Rx  Name Route Sig Dispense Refill  . ACYCLOVIR 400 MG PO TABS Oral Take 400 mg by mouth 3 (three) times daily as needed. OUTBREAK    . ALBUTEROL SULFATE HFA 108 (90 BASE) MCG/ACT IN AERS Inhalation Inhale 2 puffs into the lungs every 6 (six) hours as needed. WHEEZING     . HYDROCODONE-ACETAMINOPHEN 5-325 MG PO TABS Oral Take 1 tablet by mouth every 8 (eight) hours as needed for pain. 90 tablet 0    Print[facility] Rx, [facility] [address]  [name] Kitchen RUXOLITINIB PHOSPHATE 10 MG PO TABS Oral Take 10 mg by mouth 2 (two) times daily.      BP 153/93  Pulse 82  Temp(Src) 97.8 F (36.6 C) (Oral)  Resp 20  SpO2 95%  Physical Exam  Constitutional: He is oriented to person,  place, and time. He appears well-developed and well-nourished.  HENT:  Head: Normocephalic.  Neck: Normal range of motion.  Abdominal: Soft.  Genitourinary: Rectum normal, testes normal and penis normal.    Cremasteric reflex is present.  Neurological: He is alert and oriented to person, place, and time.  Skin: Skin is warm.    ED Course  INCISION AND DRAINAGE Date/Time: 11/12/2011 1:43 AM Performed by: Arman Filter Authorized by: Arman Filter Consent: Verbal consent obtained. Risks and benefits: risks, benefits and alternatives were discussed Consent given by: patient Patient understanding: patient states understanding of the procedure being performed Patient identity confirmed: verbally with patient Time out: Immediately prior to procedure a "time out" was called to verify the correct patient, procedure, equipment, support staff and site/side marked as required. Type: abscess Body area: anogenital Location details: scrotal wall Anesthesia: local infiltration Local anesthetic: lidocaine 1% without epinephrine Anesthetic total: 2 ml Patient sedated: no Scalpel size: 11 Needle gauge: 22 Incision type: single straight Complexity: simple Drainage: purulent Drainage amount: moderate Wound treatment: drain placed Packing material: 1/4 in iodoform gauze Patient tolerance: Patient tolerated the procedure well with no immediate complications.   (including critical care time)  Labs Reviewed - No data to display No results found.   1. Abscess of scrotum     Abscess of the scrotal wall  MDM  Abscess of the scrotal wall  Arman Filter, NP 11/12/11 0144  Arman Filter, NP 11/12/11 0151

## 2011-11-12 NOTE — ED Notes (Signed)
Patient complaining of a boil/abscess on his left inner thigh/groin area.  Patient states that he has had abscesses in the past near this area; most resolve of their own, but he has had to have one lanced at his primary care physician's office.  Patient states that this recent abscess has been there for 1-2 weeks; currently rates pain 10/10 on the numerical pain sale.  Patient asked to change into gown upon arrival to room.  Patient alert and oriented x4; PERRL present.  Will continue to monitor.

## 2011-11-12 NOTE — ED Notes (Signed)
Patient currently sitting up in bed; no respiratory or acute distress noted.  Patient updated on plan of care; informed patient that we are currently waiting on discharge instructions from patient.  Patient has no other questions or concerns at this time; will continue to monitor.

## 2011-11-12 NOTE — ED Notes (Signed)
Gail, FNP at bedside. 

## 2011-11-14 ENCOUNTER — Emergency Department (HOSPITAL_COMMUNITY)
Admission: EM | Admit: 2011-11-14 | Discharge: 2011-11-14 | Disposition: A | Discharge status: Home / Self Care | Payer: BC Managed Care – PPO | Attending: Emergency Medicine | Admitting: Emergency Medicine

## 2011-11-14 ENCOUNTER — Encounter (HOSPITAL_COMMUNITY): Payer: Self-pay | Admitting: Emergency Medicine

## 2011-11-14 DIAGNOSIS — N498 Inflammatory disorders of other specified male genital organs: Secondary | ICD-10-CM | POA: Insufficient documentation

## 2011-11-14 DIAGNOSIS — L02214 Cutaneous abscess of groin: Secondary | ICD-10-CM

## 2011-11-14 DIAGNOSIS — Z09 Encounter for follow-up examination after completed treatment for conditions other than malignant neoplasm: Secondary | ICD-10-CM | POA: Insufficient documentation

## 2011-11-14 DIAGNOSIS — J45909 Unspecified asthma, uncomplicated: Secondary | ICD-10-CM | POA: Insufficient documentation

## 2011-11-14 DIAGNOSIS — Z4800 Encounter for change or removal of nonsurgical wound dressing: Secondary | ICD-10-CM | POA: Insufficient documentation

## 2011-11-14 MED ORDER — HYDROCODONE-ACETAMINOPHEN 5-325 MG PO TABS: 2.0000 | ORAL_TABLET | ORAL | Status: AC | PRN

## 2011-11-14 NOTE — ED Provider Notes (Signed)
History     CSN: [id number]  Arrival date & time 11/14/11  0849   First MD Initiated Contact with Patient 11/14/11 [phone]      No chief complaint on file.   (Consider location/radiation/quality/duration/timing/severity/associated sxs/prior treatment) HPI  41 year old male presenting to the ED for a scrotal wall abscess followup. Patient was found to have an abscess to his proximal scrotal wall in which he had an incision and drainage 2 days ago. Packing was placed, and antibiotic given. He was instructed to return to the ED for a wound recheck.  Past Medical History  Diagnosis Date  . Asthma, cold induced   . Spleen enlargement     No past surgical history on file.  No family history on file.  History  Substance Use Topics  . Smoking status: Passive Smoker  . Smokeless tobacco: Not on file  . Alcohol Use: Yes      Review of Systems  All other systems reviewed and are negative.    Allergies  Review of patient's allergies indicates no known allergies.  Home Medications   Current Outpatient Rx  Name Route Sig Dispense Refill  . ACYCLOVIR 400 MG PO TABS Oral Take 400 mg by mouth 3 (three) times daily as needed. OUTBREAK    . ALBUTEROL SULFATE HFA 108 (90 BASE) MCG/ACT IN AERS Inhalation Inhale 2 puffs into the lungs every 6 (six) hours as needed. WHEEZING     . HYDROCODONE-ACETAMINOPHEN 5-325 MG PO TABS Oral Take 1 tablet by mouth every 8 (eight) hours as needed for pain. 90 tablet 0    [facility] [address]  [name] Kitchen RUXOLITINIB PHOSPHATE 10 MG PO TABS Oral Take 10 mg by mouth 2 (two) times daily.      There were no vitals taken for this visit.  Physical Exam  Nursing note and vitals reviewed. Constitutional:       Awake, alert, nontoxic appearance  HENT:  Head: Atraumatic.  Eyes: Right eye exhibits no discharge. Left eye exhibits no discharge.  Neck: Neck supple.  Pulmonary/Chest: Effort normal. He exhibits no tenderness.  Abdominal:  There is no tenderness. There is no rebound.  Genitourinary:     Musculoskeletal: He exhibits no tenderness.       Baseline ROM, no obvious new focal weakness  Neurological:       Mental status and motor strength appears baseline for patient and situation  Skin: No rash noted.  Psychiatric: He has a normal mood and affect.    ED Course  Procedures (including critical care time)  Labs Reviewed - No data to display No results found.   No diagnosis found.    MDM  Packing removed. Patient does exhibit mild tenderness on palpation with minimal induration and fluctuance. No pus or discharge noted. No erythema concerning for cellulitis noted. Patient states he is unable to tolerate NSAIDs, and therefore I will prescribe a short course of a narcotic pain medication. I encouraged patient to finish his antibiotic. Wound care instruction given. Patient voiced understanding and agreed with plan.        Fayrene Helper, PA-C 11/14/11 [phone]

## 2011-11-14 NOTE — ED Notes (Signed)
Pt here for wound recheck on inner thigh; pt sts still draining and still painful; pt sts ibuprofen is upsetting stomach

## 2011-11-16 ENCOUNTER — Telehealth: Payer: Self-pay | Admitting: *Deleted

## 2011-11-16 NOTE — Telephone Encounter (Signed)
Call from [facility],  States they are trying to reach pt to arrange delivery of Jakafi.  I confirmed they have pt's correct number.  I called pt myself and he states he has not received any messages from them, but he will call them now.

## 2011-11-16 NOTE — ED Provider Notes (Signed)
Medical screening examination/treatment/procedure(s) were performed by non-physician practitioner and as supervising physician I was immediately available for consultation/collaboration.  Paxtyn Boyar, MD 11/16/11 0752 

## 2011-11-20 ENCOUNTER — Encounter (HOSPITAL_COMMUNITY): Payer: Self-pay

## 2011-11-20 ENCOUNTER — Emergency Department (INDEPENDENT_AMBULATORY_CARE_PROVIDER_SITE_OTHER)
Admission: EM | Admit: 2011-11-20 | Discharge: 2011-11-20 | Disposition: A | Discharge status: Home / Self Care | Payer: BC Managed Care – PPO | Source: Home / Self Care | Attending: Emergency Medicine | Admitting: Emergency Medicine

## 2011-11-20 DIAGNOSIS — B349 Viral infection, unspecified: Secondary | ICD-10-CM

## 2011-11-20 DIAGNOSIS — B9789 Other viral agents as the cause of diseases classified elsewhere: Secondary | ICD-10-CM

## 2011-11-20 HISTORY — DX: Herpesviral infection, unspecified: B00.9

## 2011-11-20 NOTE — ED Notes (Signed)
C/o of headaches, bodyaches and chills for 3 days.  Denies respiratory sx, denies n/v or diarrhea

## 2011-11-20 NOTE — ED Provider Notes (Signed)
History     CSN: [id number]  Arrival date & time 11/20/11  7829   First MD Initiated Contact with Patient 11/20/11 1003      Chief Complaint  Patient presents with  . Generalized Body Aches  . Headache  . Chills    (Consider location/radiation/quality/duration/timing/severity/associated sxs/prior treatment) HPI Comments: Patient presents today with complaints of headache, body ache, and chills for 3 days. His bodyaches have primarily been in his low back area. He has been taking TheraFlu for his symptoms with relief of discomfort. He has not checked his temperature at home. He denies nasal congestion, sore throat, cough, abdominal pain, nausea vomiting or diarrhea. He has been urinating normally. He also admits that his symptoms are improving. He is concerned because "I cannot do the things I want to do and I have children at home."  Patient is a 41 y.o. male presenting with headaches.  Headache The primary symptoms include headaches. Primary symptoms do not include dizziness, fever, nausea or vomiting.    Past Medical History  Diagnosis Date  . Asthma, cold induced   . Spleen enlargement   . Herpes     History reviewed. No pertinent past surgical history.  History reviewed. No pertinent family history.  History  Substance Use Topics  . Smoking status: Current Some Day Smoker  . Smokeless tobacco: Not on file  . Alcohol Use: Yes      Review of Systems  Constitutional: Positive for chills and fatigue. Negative for fever and appetite change.  HENT: Negative for ear pain, congestion, sore throat, rhinorrhea and sinus pressure.   Respiratory: Negative for cough, shortness of breath and wheezing.   Cardiovascular: Negative for chest pain and palpitations.  Gastrointestinal: Negative for nausea, vomiting, abdominal pain and diarrhea.  Genitourinary: Negative for dysuria and frequency.  Musculoskeletal: Positive for myalgias and back pain.  Neurological: Positive for  headaches. Negative for dizziness.    Allergies  Review of patient's allergies indicates no known allergies.  Home Medications   Current Outpatient Rx  Name Route Sig Dispense Refill  . ACYCLOVIR 400 MG PO TABS Oral Take 400 mg by mouth 3 (three) times daily as needed. OUTBREAK    . ALBUTEROL SULFATE HFA 108 (90 BASE) MCG/ACT IN AERS Inhalation Inhale 2 puffs into the lungs every 6 (six) hours as needed. WHEEZING     . DOXYCYCLINE HYCLATE 100 MG PO TABS Oral Take 100 mg by mouth 2 (two) times daily. For 10 days - starting on 11-10-11    . HYDROCODONE-ACETAMINOPHEN 5-325 MG PO TABS Oral Take 2 tablets by mouth every 4 (four) hours as needed for pain. 6 tablet 0  . RUXOLITINIB PHOSPHATE 10 MG PO TABS Oral Take 10 mg by mouth 2 (two) times daily.      BP 160/89  Pulse 70  Temp(Src) 98.8 F (37.1 C) (Oral)  Resp 19  SpO2 99%  Physical Exam  Nursing note and vitals reviewed. Constitutional: He appears well-developed and well-nourished. No distress.       Well groomed.  HENT:  Head: Normocephalic and atraumatic.  Right Ear: Tympanic membrane, external ear and ear canal normal.  Left Ear: Tympanic membrane, external ear and ear canal normal.  Nose: Nose normal.  Mouth/Throat: Uvula is midline, oropharynx is clear and moist and mucous membranes are normal. No oropharyngeal exudate, posterior oropharyngeal edema or posterior oropharyngeal erythema.  Neck: Neck supple.  Cardiovascular: Normal rate, regular rhythm and normal heart sounds.   Pulmonary/Chest: Effort normal and breath  sounds normal. No respiratory distress.  Abdominal: Soft. Bowel sounds are normal. He exhibits no distension and no mass. There is no tenderness.  Musculoskeletal:       Lumbar back: He exhibits normal range of motion, no tenderness, no bony tenderness and no spasm.  Lymphadenopathy:    He has no cervical adenopathy.  Neurological: He is alert.  Skin: Skin is warm and dry.  Psychiatric: He has a normal mood  and affect.    ED Course  Procedures (including critical care time)  Labs Reviewed - No data to display No results found.   1. Viral infection       MDM  Myalgias, HA and chills w/o fever x 3 days - symptoms improving. VSS and exam neg. Pt advised to use otc pain relievers as needed, increase fluids and rest. Return if symptoms change or worsen.         Melody Comas, [place] 11/20/11 1122

## 2011-11-22 ENCOUNTER — Encounter: Payer: Self-pay | Admitting: Oncology

## 2011-11-22 NOTE — ED Provider Notes (Signed)
Medical screening examination/treatment/procedure(s) were performed by non-physician practitioner and as supervising physician I was immediately available for consultation/collaboration.  Luiz Blare MD   Luiz Blare, MD 11/22/11 2124

## 2011-11-22 NOTE — Progress Notes (Signed)
Patient approved for 100% [employer] discount, for family of 3, income 9024.00

## 2011-11-24 ENCOUNTER — Other Ambulatory Visit: Payer: Self-pay | Admitting: Oncology

## 2011-11-24 ENCOUNTER — Encounter: Payer: Self-pay | Admitting: Oncology

## 2011-11-27 ENCOUNTER — Other Ambulatory Visit: Payer: Self-pay

## 2011-11-27 ENCOUNTER — Encounter: Payer: Self-pay | Admitting: Oncology

## 2011-11-27 NOTE — Progress Notes (Signed)
Faxed 'Medical Letter' to [employer] [phone]), per Dr. Gaylyn Rong.  Letter reviewed and approved by patient prior to faxing.

## 2011-11-27 NOTE — Progress Notes (Signed)
Sean Walter patient assistance application to  [employer] @ [phone].

## 2011-12-04 ENCOUNTER — Other Ambulatory Visit (HOSPITAL_BASED_OUTPATIENT_CLINIC_OR_DEPARTMENT_OTHER): Payer: BC Managed Care – PPO | Admitting: Lab

## 2011-12-04 ENCOUNTER — Other Ambulatory Visit: Payer: Self-pay | Admitting: Oncology

## 2011-12-04 ENCOUNTER — Other Ambulatory Visit: Payer: BC Managed Care – PPO | Admitting: Lab

## 2011-12-04 ENCOUNTER — Telehealth: Payer: Self-pay | Admitting: *Deleted

## 2011-12-04 ENCOUNTER — Encounter: Payer: Self-pay | Admitting: Oncology

## 2011-12-04 DIAGNOSIS — D471 Chronic myeloproliferative disease: Secondary | ICD-10-CM

## 2011-12-04 DIAGNOSIS — R161 Splenomegaly, not elsewhere classified: Secondary | ICD-10-CM

## 2011-12-04 DIAGNOSIS — D474 Osteomyelofibrosis: Secondary | ICD-10-CM

## 2011-12-04 LAB — CBC WITH DIFFERENTIAL/PLATELET
BASO%: 2.7 % — ABNORMAL HIGH (ref 0.0–2.0)
EOS%: 2.1 % (ref 0.0–7.0)
MCH: 27.3 pg (ref 27.2–33.4)
MCHC: 33.1 g/dL (ref 32.0–36.0)
MONO#: 0.5 10*3/uL (ref 0.1–0.9)
RDW: 15.6 % — ABNORMAL HIGH (ref 11.0–14.6)
WBC: 5.6 10*3/uL (ref 4.0–10.3)
lymph#: 0.5 10*3/uL — ABNORMAL LOW (ref 0.9–3.3)
nRBC: 3 % — ABNORMAL HIGH (ref 0–0)

## 2011-12-04 LAB — CHCC SMEAR

## 2011-12-04 LAB — COMPREHENSIVE METABOLIC PANEL
ALT: 98 U/L — ABNORMAL HIGH (ref 0–53)
AST: 58 U/L — ABNORMAL HIGH (ref 0–37)
Alkaline Phosphatase: 101 U/L (ref 39–117)
Sodium: 136 mEq/L (ref 135–145)
Total Bilirubin: 0.5 mg/dL (ref 0.3–1.2)
Total Protein: 7.5 g/dL (ref 6.0–8.3)

## 2011-12-04 NOTE — Telephone Encounter (Signed)
Left VM for pt informing him of stable labs and continue Jakafi, no change in dose.  Next appt in April.  Call for any questions/concerns.

## 2011-12-04 NOTE — Progress Notes (Signed)
Sean Walter has been approved through [employer] cares until 11/26/12.

## 2011-12-04 NOTE — Telephone Encounter (Signed)
Message copied by Wende Mott on Mon Dec 04, 2011  1:04 PM ------      Message from: HA, Raliegh Ip T      Created: Mon Dec 04, 2011 11:31 AM       Please call pt.  Advise him to continue Jakafi, no change in dose.  Thanks.

## 2011-12-06 ENCOUNTER — Other Ambulatory Visit: Payer: Self-pay | Admitting: *Deleted

## 2011-12-06 MED ORDER — HYDROCODONE-ACETAMINOPHEN 5-325 MG PO TABS: 1.0000 | ORAL_TABLET | Freq: Three times a day (TID) | ORAL | Status: AC | PRN

## 2011-12-18 ENCOUNTER — Telehealth: Payer: Self-pay | Admitting: *Deleted

## 2011-12-18 ENCOUNTER — Other Ambulatory Visit: Payer: Self-pay | Admitting: Oncology

## 2011-12-18 NOTE — Telephone Encounter (Signed)
Pt called to report he only has a few Jakafi left and has not received refill d/t having no insurance.  Per Karel Jarvis, in managed care,, pt approved for free Jakafi through [facility], and pt needs to call them to arrange delivery.  Karel Jarvis says that Rx has been sent to [facility].  Called pt and gave [phone] for [employer] and instructed him to call them to arrange delivery of Jakafi.  Instructed him to call this RN back if any problems.  Instructed him to call today.  He verbalized understanding.

## 2011-12-19 ENCOUNTER — Telehealth: Payer: Self-pay | Admitting: *Deleted

## 2011-12-19 NOTE — Telephone Encounter (Signed)
Pt returned call and I relayed Dr. Lodema Pilot answers to his questions. See previous note.  Pt verbalized understanding.  He also reports he received his Jakafi in mail today.

## 2011-12-19 NOTE — Telephone Encounter (Signed)
Spoke w/ pt yesterday and he asked the following questions for Dr. Gaylyn Rong;  1. What size does his spleen have to be to go back to work w/o any restrictions?  2. When is the next [place] planned?   Dr. Lodema Pilot answers as follows;  1. Normal spleen is about 12cm (his was 15cm in Nov 2012). 2. I will not be able to release without restriction; until he has a trial of part time; and with 20-lb restriction first for a few months. If he does well, then I may consider releasing to full time without restriction later. He is able to go back to work now with these restrictions .  3. Next [place] is after Mar 02, 2012 (6 months from the last [place]).   I called pt to give him Dr. Lodema Pilot answers.  Left VM for pt to return call.

## 2012-01-04 ENCOUNTER — Other Ambulatory Visit: Payer: Self-pay | Admitting: *Deleted

## 2012-01-04 DIAGNOSIS — D72829 Elevated white blood cell count, unspecified: Secondary | ICD-10-CM

## 2012-01-04 MED ORDER — HYDROCODONE-ACETAMINOPHEN 5-325 MG PO TABS: 1.0000 | ORAL_TABLET | Freq: Three times a day (TID) | ORAL | Status: AC | PRN

## 2012-01-15 ENCOUNTER — Telehealth: Payer: Self-pay | Admitting: *Deleted

## 2012-01-15 NOTE — Telephone Encounter (Signed)
Pt called to state that Medicaid office tried to call Sean Walter - [facility]) but someone here told them that pt is not on record as being seen here?  Instructed pt to have them call Sean again and ask for RN if any problems. Pt also confirmed his appt here on 01/24/12.

## 2012-01-24 ENCOUNTER — Other Ambulatory Visit (HOSPITAL_BASED_OUTPATIENT_CLINIC_OR_DEPARTMENT_OTHER): Payer: Self-pay | Admitting: Lab

## 2012-01-24 ENCOUNTER — Ambulatory Visit (HOSPITAL_BASED_OUTPATIENT_CLINIC_OR_DEPARTMENT_OTHER): Payer: Self-pay | Admitting: Oncology

## 2012-01-24 ENCOUNTER — Telehealth: Payer: Self-pay | Admitting: Oncology

## 2012-01-24 VITALS — BP 149/80 | HR 92 | Temp 97.4°F | Ht 71.2 in | Wt 226.3 lb

## 2012-01-24 DIAGNOSIS — R161 Splenomegaly, not elsewhere classified: Secondary | ICD-10-CM

## 2012-01-24 DIAGNOSIS — D7581 Myelofibrosis: Secondary | ICD-10-CM

## 2012-01-24 DIAGNOSIS — D471 Chronic myeloproliferative disease: Secondary | ICD-10-CM

## 2012-01-24 DIAGNOSIS — D696 Thrombocytopenia, unspecified: Secondary | ICD-10-CM

## 2012-01-24 DIAGNOSIS — D474 Osteomyelofibrosis: Secondary | ICD-10-CM

## 2012-01-24 DIAGNOSIS — D649 Anemia, unspecified: Secondary | ICD-10-CM

## 2012-01-24 LAB — CBC WITH DIFFERENTIAL/PLATELET
BASO%: 3.4 % — ABNORMAL HIGH (ref 0.0–2.0)
HCT: 40.6 % (ref 38.4–49.9)
LYMPH%: 12.4 % — ABNORMAL LOW (ref 14.0–49.0)
MCH: 26.8 pg — ABNORMAL LOW (ref 27.2–33.4)
MCHC: 32.8 g/dL (ref 32.0–36.0)
MCV: 81.7 fL (ref 79.3–98.0)
MONO#: 0.5 10*3/uL (ref 0.1–0.9)
MONO%: 5.2 % (ref 0.0–14.0)
NEUT%: 77.3 % — ABNORMAL HIGH (ref 39.0–75.0)
Platelets: 102 10*3/uL — ABNORMAL LOW (ref 140–400)
WBC: 10.1 10*3/uL (ref 4.0–10.3)

## 2012-01-24 LAB — TECHNOLOGIST REVIEW

## 2012-01-24 LAB — COMPREHENSIVE METABOLIC PANEL
AST: 36 U/L (ref 0–37)
BUN: 14 mg/dL (ref 6–23)
Calcium: 9.6 mg/dL (ref 8.4–10.5)
Chloride: 103 mEq/L (ref 96–112)
Creatinine, Ser: 1.49 mg/dL — ABNORMAL HIGH (ref 0.50–1.35)

## 2012-01-24 NOTE — Patient Instructions (Signed)
1.  Myelofibrosis:  Stable disease.  Continue Jakafi at 10mg  by mouth twice daily. 2.  Repeat abdominal US in May 2013 to see where we are with the enlarged spleen.

## 2012-01-24 NOTE — Progress Notes (Signed)
Sean Walter OFFICE PROGRESS NOTE  DIAGNOSIS:  High-grade primary myelofibrosis; IPSS score of 1 (given symptoms) or low risk; cytogentics normal 46, XY.  CURRENT THERAPY:  Pending allogeneic hematopoietic stem cell transplant.  He started [place] in Feb 2012.   INTERVAL HISTORY: Sean Walter 41 y.o. male returns for her follow up by himself.  He reports feeling relatively well.  He has left flank pain when he wakes up in the morning.  Pain is mild/moderate; crampy; no radiation, without nausea/vomiting, bleeding sx.  Pain resolved completely taking Vicodin in the morning.  He is able to walk brisk on treadmill everyday for about 30 minutes without SOB, vertigo, chest pain, DOE.  His stamina now is as good as before diagnosis of myelofibrosis. 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   Patient denies fatigue, headache, visual changes, confusion, drenching night sweats, palpable lymph node swelling, mucositis, odynophagia, dysphagia, nausea vomiting, jaundice, chest pain, palpitation, shortness of breath, dyspnea on exertion, productive cough, gum bleeding, epistaxis, hematemesis, hemoptysis, abdominal swelling, early satiety, melena, hematochezia, hematuria, skin rash, spontaneous bleeding, joint swelling, joint pain, heat or cold intolerance, bowel bladder incontinence, back pain, focal motor weakness, paresthesia, depression, suicidal or homocidal ideation, feeling hopelessness.   MEDICAL HISTORY: Past Medical History  Diagnosis Date  . Asthma, cold induced   . Spleen enlargement   . Herpes     MEDICATIONS:  Current Outpatient Prescriptions  Medication Sig Dispense Refill  . albuterol (PROVENTIL HFA;VENTOLIN HFA) 108 (90 BASE) MCG/ACT inhaler Inhale 2 puffs into the lungs every 6 (six) hours as needed. WHEEZING       . HYDROcodone-acetaminophen (NORCO) 5-325 MG per tablet Take 1  tablet by mouth every 6 (six) hours as needed.      . ruxolitinib phosphate (JAKAFI) 10 MG tablet Take 10 mg by mouth 2 (two) times daily.      [name] Kitchen acyclovir (ZOVIRAX) 400 MG tablet Take 400 mg by mouth 3 (three) times daily as needed. OUTBREAK      . doxycycline (VIBRA-TABS) 100 MG tablet Take 100 mg by mouth 2 (two) times daily. For 10 days - starting on 11-10-11        REVIEW OF SYSTEMS:  Pertinent items are noted in HPI.   PHYSICAL EXAMINATION: ECOG PERFORMANCE STATUS: 0  Filed Vitals:   01/24/12 0923  BP: 149/80  Pulse: 92  Temp: 97.4 F (36.3 C)    General:  well-nourished in no acute distress.  Eyes:  no scleral icterus.  ENT:  There were no oropharyngeal lesions.  Neck was without thyromegaly.  Lymphatics:  Negative cervical, supraclavicular or axillary adenopathy.  Respiratory: lungs were clear bilaterally without wheezing or crackles.  Cardiovascular:  Regular rate and rhythm, S1/S2, without murmur, rub or gallop.  There was no pedal edema.  GI:  abdomen was soft, flat, nontender, nondistended, without organomegaly. I could not appreciate his spleen on exam.  Muscoloskeletal:  no spinal tenderness of palpation of vertebral spine.  Skin exam was without echymosis, petichae.  Neuro exam was nonfocal.  Patient was able to get on and off exam table without assistance.  Gait was normal.  Patient was alerted and oriented.  Attention was good.   Language was appropriate.  Mood was normal without depression.  Speech was not pressured.  Thought content was not tangential.     LABORATORY DATA: Results for orders  placed in visit on 01/24/12 (from the past 48 hour(s))  CBC WITH DIFFERENTIAL     Status: Abnormal   Collection Time   01/24/12  9:04 AM      Component Value Range Comment   WBC 10.1  4.0 - 10.3 (10e3/uL)    NEUT# 7.8 (*) 1.5 - 6.5 (10e3/uL)    HGB 13.3  13.0 - 17.1 (g/dL)    HCT 16.1  09.6 - 04.5 (%)    Platelets 102 (*) 140 - 400 (10e3/uL)    MCV 81.7  79.3 - 98.0 (fL)    MCH  26.8 (*) 27.2 - 33.4 (pg)    MCHC 32.8  32.0 - 36.0 (g/dL)    RBC 4.09  8.11 - 9.14 (10e6/uL)    RDW 16.5 (*) 11.0 - 14.6 (%)    lymph# 1.3  0.9 - 3.3 (10e3/uL)    MONO# 0.5  0.1 - 0.9 (10e3/uL)    Eosinophils Absolute 0.2  0.0 - 0.5 (10e3/uL)    Basophils Absolute 0.3 (*) 0.0 - 0.1 (10e3/uL)    NEUT% 77.3 (*) 39.0 - 75.0 (%)    LYMPH% 12.4 (*) 14.0 - 49.0 (%)    MONO% 5.2  0.0 - 14.0 (%)    EOS% 1.7  0.0 - 7.0 (%)    BASO% 3.4 (*) 0.0 - 2.0 (%)    nRBC 3 (*) 0 - 0 (%)   TECHNOLOGIST REVIEW     Status: Normal   Collection Time   01/24/12  9:04 AM      Component Value Range Comment   Technologist Review Metas and Myelocytes present     CMET: pending.     ASSESSMENT AND PLAN:  1.  Myelofibrosis:  Stable disease.  I requested a repeat abdominal US for May 2013 (6 months from last [place]) to see his response.  I recommended him to continue Jakafi at 10mg  PO BID.  2.  Anemia and thrombocytopenia:  From Jakafi.  His cytopenia has improved with Jakafi dose reduction.  I advised him to continue current dose at 10mg  PO BID.  3.  Social:  He would like to go back to work. He should be able to work but without heavy lifting more than 20 lb for now.  I will reassess after abdominal US in May 2013.  He is able to work out on treadmill everyday without symptoms.  This is an encouraging sign.   4.  Follow up: with me after [place] in May 2013.   The length of time of the face-to-face encounter was 15 minutes. More than 50% of time was spent counseling and coordination of care.

## 2012-01-24 NOTE — Telephone Encounter (Signed)
Gv pt appt for [id number].  scheduled pt for u/s of the abd @ [facility] 05/01

## 2012-02-16 ENCOUNTER — Emergency Department (HOSPITAL_COMMUNITY): Admission: EM | Admit: 2012-02-16 | Discharge: 2012-02-16 | Discharge status: Against Medical Advice | Payer: Self-pay

## 2012-02-21 ENCOUNTER — Other Ambulatory Visit (HOSPITAL_COMMUNITY): Payer: Self-pay

## 2012-02-21 ENCOUNTER — Telehealth: Payer: Self-pay | Admitting: *Deleted

## 2012-02-21 NOTE — Telephone Encounter (Signed)
VM from pt states he missed his [place] yesterday and should he r/s his appt w/ Dr. Gaylyn Rong for tomorrow?  Called pt back and he has r/s his [place] to tomorrow morning but it won't be read in time for his 10 am appt w/ Dr. Gaylyn Rong.  Per Dr. Gaylyn Rong,  [facility] to r/s pt to Friday at 1230pm for office visit and labs at 12 noon.  Pt verbalized understanding.  POF sent to scheduling.

## 2012-02-22 ENCOUNTER — Other Ambulatory Visit (HOSPITAL_COMMUNITY): Payer: Self-pay

## 2012-02-22 ENCOUNTER — Other Ambulatory Visit: Payer: Self-pay | Admitting: Lab

## 2012-02-22 ENCOUNTER — Ambulatory Visit: Payer: Self-pay | Admitting: Oncology

## 2012-02-22 ENCOUNTER — Other Ambulatory Visit: Payer: Self-pay | Admitting: Oncology

## 2012-02-22 ENCOUNTER — Ambulatory Visit (HOSPITAL_COMMUNITY)
Admission: RE | Admit: 2012-02-22 | Discharge: 2012-02-22 | Disposition: A | Discharge status: Home / Self Care | Payer: Self-pay | Source: Ambulatory Visit | Attending: Oncology | Admitting: Oncology

## 2012-02-22 DIAGNOSIS — R161 Splenomegaly, not elsewhere classified: Secondary | ICD-10-CM

## 2012-02-22 DIAGNOSIS — D471 Chronic myeloproliferative disease: Secondary | ICD-10-CM

## 2012-02-22 DIAGNOSIS — D474 Osteomyelofibrosis: Secondary | ICD-10-CM | POA: Insufficient documentation

## 2012-02-23 ENCOUNTER — Ambulatory Visit (HOSPITAL_BASED_OUTPATIENT_CLINIC_OR_DEPARTMENT_OTHER): Payer: Self-pay | Admitting: Oncology

## 2012-02-23 ENCOUNTER — Other Ambulatory Visit (HOSPITAL_BASED_OUTPATIENT_CLINIC_OR_DEPARTMENT_OTHER): Payer: Self-pay | Admitting: Lab

## 2012-02-23 ENCOUNTER — Encounter: Payer: Self-pay | Admitting: *Deleted

## 2012-02-23 ENCOUNTER — Other Ambulatory Visit: Payer: Self-pay | Admitting: *Deleted

## 2012-02-23 ENCOUNTER — Other Ambulatory Visit: Payer: Self-pay | Admitting: Oncology

## 2012-02-23 VITALS — BP 130/79 | HR 82 | Temp 97.8°F | Ht 71.0 in | Wt 219.9 lb

## 2012-02-23 DIAGNOSIS — D638 Anemia in other chronic diseases classified elsewhere: Secondary | ICD-10-CM

## 2012-02-23 DIAGNOSIS — R161 Splenomegaly, not elsewhere classified: Secondary | ICD-10-CM

## 2012-02-23 DIAGNOSIS — D471 Chronic myeloproliferative disease: Secondary | ICD-10-CM

## 2012-02-23 DIAGNOSIS — R1012 Left upper quadrant pain: Secondary | ICD-10-CM

## 2012-02-23 DIAGNOSIS — D474 Osteomyelofibrosis: Secondary | ICD-10-CM

## 2012-02-23 LAB — CBC WITH DIFFERENTIAL/PLATELET
Basophils Absolute: 0.5 10*3/uL — ABNORMAL HIGH (ref 0.0–0.1)
Eosinophils Absolute: 0.2 10*3/uL (ref 0.0–0.5)
HGB: 12.1 g/dL — ABNORMAL LOW (ref 13.0–17.1)
NEUT#: 11.9 10*3/uL — ABNORMAL HIGH (ref 1.5–6.5)
RDW: 17.3 % — ABNORMAL HIGH (ref 11.0–14.6)
lymph#: 1.2 10*3/uL (ref 0.9–3.3)

## 2012-02-23 LAB — TECHNOLOGIST REVIEW

## 2012-02-23 LAB — COMPREHENSIVE METABOLIC PANEL
Albumin: 4.3 g/dL (ref 3.5–5.2)
CO2: 25 mEq/L (ref 19–32)
Glucose, Bld: 119 mg/dL — ABNORMAL HIGH (ref 70–99)
Potassium: 4.2 mEq/L (ref 3.5–5.3)
Sodium: 136 mEq/L (ref 135–145)
Total Protein: 7.3 g/dL (ref 6.0–8.3)

## 2012-02-23 MED ORDER — RUXOLITINIB PHOSPHATE 15 MG PO TABS: 15.0000 mg | ORAL_TABLET | Freq: Two times a day (BID) | ORAL | Status: DC

## 2012-02-23 NOTE — Progress Notes (Signed)
Correction to [employer] # is [phone]  (not [phone] as documented in prior note).   Rx for Earvin Hansen was faxed to [facility] at correct fax number (this RN called them to get correct number).

## 2012-02-23 NOTE — Telephone Encounter (Signed)
Faxed Rx for Jakafi 15 mg BID to [facility] at fax #[phone].  This is a dose increase per Dr. Gaylyn Rong and pt to finish his current dose (10 mg) pills and then start the 15 mg when those are gone.
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
## 2012-02-26 ENCOUNTER — Emergency Department (HOSPITAL_COMMUNITY)
Admission: EM | Admit: 2012-02-26 | Discharge: 2012-02-26 | Disposition: A | Discharge status: Home / Self Care | Payer: Self-pay | Attending: Emergency Medicine | Admitting: Emergency Medicine

## 2012-02-26 ENCOUNTER — Telehealth: Payer: Self-pay | Admitting: Oncology

## 2012-02-26 ENCOUNTER — Encounter (HOSPITAL_COMMUNITY): Payer: Self-pay | Admitting: Emergency Medicine

## 2012-02-26 DIAGNOSIS — S02620A Fracture of subcondylar process of mandible, unspecified side, initial encounter for closed fracture: Secondary | ICD-10-CM | POA: Insufficient documentation

## 2012-02-26 DIAGNOSIS — K089 Disorder of teeth and supporting structures, unspecified: Secondary | ICD-10-CM | POA: Insufficient documentation

## 2012-02-26 DIAGNOSIS — Z981 Arthrodesis status: Secondary | ICD-10-CM | POA: Insufficient documentation
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
## 2012-02-26 NOTE — Telephone Encounter (Signed)
called pts scheduled appt for 06/06 and informed pt to pick up schedule for july-sept at that time

## 2012-02-26 NOTE — Discharge Instructions (Signed)
Mandibular Fracture You will need to follow up with Dr. Lazarus Salines for follow up of your broken jaw. Surgeons usually wire the jaw to the upper teeth to treat a broken mandible. A tetanus booster may also be needed. Apply an ice pack to the injured area for 30 minutes every 2 hours, and do not chew, talk, or move your jaw until you have seen a specialist. A liquid diet using a straw is best. Consider adding a nutritional supplements to your diet like clear liquid nutrition beverage. Your caregiver can give you recommendations. SEEK IMMEDIATE MEDICAL CARE IF:  You develop a severe headache, drowsiness, or weakness.   You have difficulty breathing or swallowing.   You develop a fever, chills, or other serious complaints.  MAKE SURE YOU:   Understand these instructions.   Will watch your condition.   Will get help right away if you are not doing well or get worse.  Document Released: 11/16/2004 Document Revised: 09/28/2011 Document Reviewed: 10/09/2005 [facility] Patient Information 2012 [place], [place].
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
## 2012-02-29 NOTE — ED Provider Notes (Signed)
Medical screening examination/treatment/procedure(s) were performed by non-physician practitioner and as supervising physician I was immediately available for consultation/collaboration.   Dione Booze, MD 02/29/12 [phone]

## 2012-03-08 ENCOUNTER — Telehealth: Payer: Self-pay | Admitting: *Deleted

## 2012-03-08 NOTE — Telephone Encounter (Signed)
Pt called to notify he has about one week left of Jakafi 10 mg tablet and is supposed to increase dose on next refill.  Per Dr. Gaylyn Rong,  Dose increased to 15 mg BID. Rx was sent to [facility] by this RN on 02/22/12.  I called them today at ph# [phone] and spoke w/ Marchelle Folks.  She confirms they have the new Rx for 15 mg and will arrange for pharmacy to contact pt for delivery early next week.

## 2012-03-25 ENCOUNTER — Other Ambulatory Visit: Payer: Self-pay | Admitting: Lab

## 2012-03-28 ENCOUNTER — Other Ambulatory Visit (HOSPITAL_BASED_OUTPATIENT_CLINIC_OR_DEPARTMENT_OTHER): Payer: Self-pay | Admitting: Lab

## 2012-03-28 ENCOUNTER — Encounter: Payer: Self-pay | Admitting: Oncology

## 2012-03-28 DIAGNOSIS — D471 Chronic myeloproliferative disease: Secondary | ICD-10-CM

## 2012-03-28 DIAGNOSIS — R161 Splenomegaly, not elsewhere classified: Secondary | ICD-10-CM

## 2012-03-28 DIAGNOSIS — D474 Osteomyelofibrosis: Secondary | ICD-10-CM

## 2012-03-28 LAB — CBC WITH DIFFERENTIAL/PLATELET
BASO%: 2.3 % — ABNORMAL HIGH (ref 0.0–2.0)
EOS%: 0.9 % (ref 0.0–7.0)
LYMPH%: 8 % — ABNORMAL LOW (ref 14.0–49.0)
MCHC: 32.6 g/dL (ref 32.0–36.0)
MONO#: 1 10*3/uL — ABNORMAL HIGH (ref 0.1–0.9)
Platelets: 170 10*3/uL (ref 140–400)
RBC: 5.19 10*6/uL (ref 4.20–5.82)
WBC: 17.8 10*3/uL — ABNORMAL HIGH (ref 4.0–10.3)
lymph#: 1.4 10*3/uL (ref 0.9–3.3)
nRBC: 1 % — ABNORMAL HIGH (ref 0–0)
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
## 2012-04-02 ENCOUNTER — Telehealth: Payer: Self-pay | Admitting: *Deleted

## 2012-04-02 NOTE — Telephone Encounter (Signed)
Spoke w/ Dr. Gaylyn Rong and reviewed pt's job description.  It says pt has to lift 21 to 50 lbs occasionally (1-33% of the time).   Dr. Gaylyn Rong states pt may return to work full time in his medical opinion with restriction of not lifting more than 20 lbs at any time for at least the first 3 months and then he will re evaluate.   Waiting for pt to return call so I can inform him of restriction and ask if he needs letter regarding above from Dr. Gaylyn Rong.

## 2012-04-02 NOTE — Telephone Encounter (Signed)
Pt left VM asking if paperwork for his return to work is ready?   This RN called pt back and left VM asking him to return call again to clarify which paperwork he needs from Dr. Gaylyn Rong.   Pt did drop off his job description to Dr. Gaylyn Rong last week,  But do not recall there being any forms or paperwork to fill out.  Waiting on return call from pt.[name] Kitchen

## 2012-04-03 ENCOUNTER — Telehealth: Payer: Self-pay | Admitting: *Deleted

## 2012-04-03 ENCOUNTER — Other Ambulatory Visit: Payer: Self-pay | Admitting: Oncology
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
## 2012-04-04 ENCOUNTER — Telehealth: Payer: Self-pay | Admitting: Oncology

## 2012-04-04 NOTE — Telephone Encounter (Signed)
gv order from 06/12 to Dr. g nurse for insturctions

## 2012-04-05 ENCOUNTER — Encounter (HOSPITAL_COMMUNITY): Payer: Self-pay | Admitting: Pharmacy Technician

## 2012-04-08 ENCOUNTER — Encounter (HOSPITAL_COMMUNITY)
Admission: RE | Admit: 2012-04-08 | Discharge: 2012-04-08 | Disposition: A | Discharge status: Home / Self Care | Payer: Self-pay | Source: Ambulatory Visit | Attending: Otolaryngology | Admitting: Otolaryngology

## 2012-04-08 ENCOUNTER — Encounter (HOSPITAL_COMMUNITY): Payer: Self-pay | Admitting: *Deleted

## 2012-04-08 LAB — COMPREHENSIVE METABOLIC PANEL
ALT: 56 U/L — ABNORMAL HIGH (ref 0–53)
AST: 56 U/L — ABNORMAL HIGH (ref 0–37)
CO2: 26 mEq/L (ref 19–32)
Chloride: 101 mEq/L (ref 96–112)
GFR calc non Af Amer: 79 mL/min — ABNORMAL LOW (ref >90)
Sodium: 137 mEq/L (ref 135–145)
Total Bilirubin: 0.4 mg/dL (ref 0.3–1.2)

## 2012-04-08 LAB — CBC
HCT: 41.4 % (ref 39.0–52.0)
MCH: 26.3 pg (ref 26.0–34.0)
MCHC: 32.1 g/dL (ref 30.0–36.0)
RDW: 16.6 % — ABNORMAL HIGH (ref 11.5–15.5)

## 2012-04-08 LAB — SURGICAL PCR SCREEN: Staphylococcus aureus: NEGATIVE

## 2012-04-08 NOTE — Pre-Procedure Instructions (Signed)
[name]  04/08/2012   Your procedure is scheduled on: Wednesday 04/10/12   Report to [facility] at 630 AM.  Call this number if you have problems the morning of surgery: [phone]   Remember:   Do not eat food:After Midnight.  May have clear liquids: up to 4 Hours before arrival.(until 230 am)  Clear liquids include soda, tea, black coffee, apple or grape juice, broth.  Take these medicines the morning of surgery with A SIP OF WATER:  Albuterol, hydrocodone    Do not wear jewelry, make-up or nail polish.  Do not wear lotions, powders, or perfumes. You may wear deodorant.  Do not shave 48 hours prior to surgery. Men may shave face and neck.  Do not bring valuables to the hospital.  Contacts, dentures or bridgework may not be worn into surgery.  Leave suitcase in the car. After surgery it may be brought to your room.  For patients admitted to the hospital, checkout time is 11:00 AM the day of discharge.   Patients discharged the day of surgery will not be allowed to drive home.  Name and phone number of your driver:  [name]  [phone]  Special Instructions: CHG Shower Use Special Wash: 1/2 bottle night before surgery and 1/2 bottle morning of surgery.   Please read over the following fact sheets that you were given: Pain Booklet, Coughing and Deep Breathing, MRSA Information and Surgical Site Infection Prevention

## 2012-04-09 ENCOUNTER — Encounter (HOSPITAL_COMMUNITY): Payer: Self-pay | Admitting: Vascular Surgery
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
## 2012-04-10 ENCOUNTER — Encounter (HOSPITAL_COMMUNITY): Payer: Self-pay | Admitting: *Deleted

## 2012-04-10 ENCOUNTER — Encounter (HOSPITAL_COMMUNITY)
Admission: RE | Disposition: A | Discharge status: Home / Self Care | Payer: Self-pay | Source: Ambulatory Visit | Attending: Otolaryngology

## 2012-04-10 ENCOUNTER — Encounter (HOSPITAL_COMMUNITY): Payer: Self-pay | Admitting: Vascular Surgery

## 2012-04-10 ENCOUNTER — Ambulatory Visit (HOSPITAL_COMMUNITY)
Admission: RE | Admit: 2012-04-10 | Discharge: 2012-04-10 | Disposition: A | Discharge status: Home / Self Care | Payer: Self-pay | Source: Ambulatory Visit | Attending: Otolaryngology | Admitting: Otolaryngology

## 2012-04-10 ENCOUNTER — Encounter (HOSPITAL_COMMUNITY): Payer: Self-pay | Admitting: Anesthesiology

## 2012-04-10 ENCOUNTER — Ambulatory Visit (HOSPITAL_COMMUNITY): Payer: Self-pay | Admitting: Vascular Surgery

## 2012-04-10 DIAGNOSIS — Z01812 Encounter for preprocedural laboratory examination: Secondary | ICD-10-CM | POA: Insufficient documentation

## 2012-04-10 DIAGNOSIS — Z4789 Encounter for other orthopedic aftercare: Secondary | ICD-10-CM | POA: Insufficient documentation

## 2012-04-10 DIAGNOSIS — S02610A Fracture of condylar process of mandible, unspecified side, initial encounter for closed fracture: Secondary | ICD-10-CM

## 2012-04-10 DIAGNOSIS — J45909 Unspecified asthma, uncomplicated: Secondary | ICD-10-CM | POA: Insufficient documentation

## 2012-04-10 DIAGNOSIS — D7581 Myelofibrosis: Secondary | ICD-10-CM | POA: Insufficient documentation

## 2012-04-10 HISTORY — PX: MANDIBULAR HARDWARE REMOVAL: SHX5205

## 2012-04-10 HISTORY — DX: Other specified health status: Z78.9

## 2012-04-10 SURGERY — REMOVAL, HARDWARE, MANDIBLE
Anesthesia: General | Site: Mouth | Laterality: Right | Wound class: Clean Contaminated

## 2012-04-10 MED ORDER — FENTANYL CITRATE 0.05 MG/ML IJ SOLN
INTRAMUSCULAR | Status: DC | PRN
  Administered 2012-04-10: 50 ug via INTRAVENOUS
  Administered 2012-04-10: 100 ug via INTRAVENOUS

## 2012-04-10 MED ORDER — PROPOFOL 10 MG/ML IV EMUL
INTRAVENOUS | Status: DC | PRN
  Administered 2012-04-10: 200 mg via INTRAVENOUS

## 2012-04-10 MED ORDER — HYDROMORPHONE HCL PF 1 MG/ML IJ SOLN
INTRAMUSCULAR | Status: AC
  Filled 2012-04-10: qty 1

## 2012-04-10 MED ORDER — CEFAZOLIN SODIUM 1-5 GM-% IV SOLN
INTRAVENOUS | Status: AC
  Filled 2012-04-10: qty 100

## 2012-04-10 MED ORDER — 0.9 % SODIUM CHLORIDE (POUR BTL) OPTIME
TOPICAL | Status: DC | PRN
  Administered 2012-04-10: 1000 mL

## 2012-04-10 MED ORDER — ONDANSETRON HCL 4 MG/2ML IJ SOLN
INTRAMUSCULAR | Status: DC | PRN
  Administered 2012-04-10: 4 mg via INTRAVENOUS

## 2012-04-10 MED ORDER — ONDANSETRON HCL 4 MG/2ML IJ SOLN: 4.0000 mg | Freq: Once | INTRAMUSCULAR | Status: DC | PRN

## 2012-04-10 MED ORDER — MIDAZOLAM HCL 5 MG/5ML IJ SOLN
INTRAMUSCULAR | Status: DC | PRN
  Administered 2012-04-10: 2 mg via INTRAVENOUS

## 2012-04-10 MED ORDER — LACTATED RINGERS IV SOLN
INTRAVENOUS | Status: DC | PRN
  Administered 2012-04-10: 08:00:00 via INTRAVENOUS

## 2012-04-10 MED ORDER — HYDROMORPHONE HCL PF 1 MG/ML IJ SOLN
0.2500 mg | INTRAMUSCULAR | Status: DC | PRN
  Administered 2012-04-10 (×2): 0.5 mg via INTRAVENOUS

## 2012-04-10 MED ORDER — CEFAZOLIN SODIUM-DEXTROSE 2-3 GM-% IV SOLR
2.0000 g | INTRAVENOUS | Status: AC
  Administered 2012-04-10: 2 g via INTRAVENOUS

## 2012-04-10 MED ORDER — LIDOCAINE HCL 1 % IJ SOLN
INTRAMUSCULAR | Status: DC | PRN
  Administered 2012-04-10: 80 mg via INTRADERMAL

## 2012-04-10 SURGICAL SUPPLY — 20 items
BLADE SURG 15 STRL LF DISP TIS (BLADE) ×1 IMPLANT
BLADE SURG 15 STRL SS (BLADE) ×2
CANISTER SUCTION 2500CC (MISCELLANEOUS) ×2 IMPLANT
CLOTH BEACON ORANGE TIMEOUT ST (SAFETY) ×2 IMPLANT
CRADLE DONUT ADULT HEAD (MISCELLANEOUS) ×1 IMPLANT
DRAPE PROXIMA HALF (DRAPES) ×2 IMPLANT
GAUZE SPONGE 4X4 16PLY XRAY LF (GAUZE/BANDAGES/DRESSINGS) ×1 IMPLANT
GLOVE ECLIPSE 8.0 STRL XLNG CF (GLOVE) ×2 IMPLANT
GOWN PREVENTION PLUS XLARGE (GOWN DISPOSABLE) ×2 IMPLANT
GOWN STRL NON-REIN LRG LVL3 (GOWN DISPOSABLE) ×2 IMPLANT
KIT ROOM TURNOVER OR (KITS) ×2 IMPLANT
NDL HYPO 25X1 1.5 SAFETY (NEEDLE) IMPLANT
NEEDLE HYPO 25X1 1.5 SAFETY (NEEDLE) IMPLANT
NS IRRIG 1000ML POUR BTL (IV SOLUTION) ×2 IMPLANT
PACK EENT II TURBAN DRAPE (CUSTOM PROCEDURE TRAY) ×2 IMPLANT
PAD ARMBOARD 7.5X6 YLW CONV (MISCELLANEOUS) ×4 IMPLANT
SYR CONTROL 10ML LL (SYRINGE) ×1 IMPLANT
TOWEL OR 17X24 6PK STRL BLUE (TOWEL DISPOSABLE) ×4 IMPLANT
TUBE CONNECTING 12X1/4 (SUCTIONS) ×2 IMPLANT
WATER STERILE IRR 1000ML POUR (IV SOLUTION) ×2 IMPLANT

## 2012-04-10 NOTE — Preoperative (Signed)
Beta Blockers   Reason not to administer Beta Blockers:Not Applicable 

## 2012-04-10 NOTE — Anesthesia Preprocedure Evaluation (Addendum)
Anesthesia Evaluation  Patient identified by MRN, date of birth, ID band Patient awake    Reviewed: Allergy & Precautions, H&P , NPO status , Patient's Chart, lab work & pertinent test results  History of Anesthesia Complications Negative for: history of anesthetic complications  Airway Mallampati: I TM Distance: >3 FB Neck ROM: full    Dental  (+) Dental Advisory Given, Missing and Teeth Intact,    Pulmonary asthma ,          Cardiovascular negative cardio ROS  Rhythm:regular Rate:Normal     Neuro/Psych negative neurological ROS     GI/Hepatic negative GI ROS,   Endo/Other  negative endocrine ROS  Renal/GU negative Renal ROS     Musculoskeletal negative musculoskeletal ROS (+)   Abdominal   Peds  Hematology   Anesthesia Other Findings   Reproductive/Obstetrics                          Anesthesia Physical Anesthesia Plan  ASA: I  Anesthesia Plan: General   Post-op Pain Management:    Induction: Intravenous  Airway Management Planned: Oral ETT  Additional Equipment:   Intra-op Plan:   Post-operative Plan: Extubation in OR  Informed Consent: I have reviewed the patients History and Physical, chart, labs and discussed the procedure including the risks, benefits and alternatives for the proposed anesthesia with the patient or authorized representative who has indicated his/her understanding and acceptance.     Plan Discussed with: CRNA, Anesthesiologist and Surgeon  Anesthesia Plan Comments:         Anesthesia Quick Evaluation

## 2012-04-10 NOTE — Progress Notes (Signed)
Patient decided he did not want valuables locked up patient notified that we could not be responsible if something happened to them

## 2012-04-10 NOTE — Op Note (Signed)
04/10/2012  9:14 AM    Sean Walter  [id number]   Pre-Op Dx:  RIGHT sub condylar  Mandible fracture, status post arch bar placement  Post-op Dx: Same  Proc: Removal arch bars  Surg:  Flo Shanks T MD  Anes:  General LMA  EBL:  Minimal  Comp:  None  Findings:  Teeth in good repair with recent occlusion. Upper and lower Stryker modified arch bars removed without difficulty.  Procedure: With the patient in a comfortable supine position, general LMA anesthesia was induced without difficulty.    At an appropriate level, the patient was placed in a semisitting position.  The pharynx was suctioned clear.    Under direct vision, screws were extracted from the mandible and maxilla. Several of them had become submucosal and were exposed with a Therapist, nutritional.  The arch bars were removed without difficulty.  Hemostasis was observed.  The patient was returned to anesthesia, fully awakened and extubated.  He was transferred to recovery in stable condition.   Dispo:   PACU to home  Plan: Ice, elevation,  Narcotic analgesia, oral hygiene measures. Diet as comfortable.  Resume normal activities tomorrow.  Recheck my office 2-3 weeks.   Cephus Richer MD

## 2012-04-10 NOTE — Discharge Instructions (Signed)
Rinse mouth with cool dilute salt water frequently to clear old blood and bad taste.   Advance diet as comfortable.   Resume normal activities tomorrow.   Call for unusual bleeding or persistent pain, [phone].

## 2012-04-10 NOTE — Transfer of Care (Signed)
Immediate Anesthesia Transfer of Care Note  Patient: Sean Walter  Procedure(s) Performed: Procedure(s) (LRB): MANDIBULAR HARDWARE REMOVAL (Right)  Patient Location: PACU  Anesthesia Type: General  Level of Consciousness: awake, alert , oriented and patient cooperative  Airway & Oxygen Therapy: Patient Spontanous Breathing and Patient connected to face mask oxygen  Post-op Assessment: Report given to PACU RN and Post -op Vital signs reviewed and stable  Post vital signs: Reviewed and stable  Complications: No apparent anesthesia complications

## 2012-04-10 NOTE — Anesthesia Procedure Notes (Signed)
Procedure Name: LMA Insertion Date/Time: 04/10/2012 8:43 AM Performed by: Leona Singleton A Pre-anesthesia Checklist: Patient identified Patient Re-evaluated:Patient Re-evaluated prior to inductionOxygen Delivery Method: Circle system utilized Preoxygenation: Pre-oxygenation with 100% oxygen Intubation Type: IV induction LMA: LMA inserted LMA Size: 5.0 Tube type: Oral Number of attempts: 1 Placement Confirmation: positive ETCO2 and breath sounds checked- equal and bilateral Tube secured with: Tape Dental Injury: Teeth and Oropharynx as per pre-operative assessment

## 2012-04-10 NOTE — Interval H&P Note (Signed)
History and Physical Interval Note:  04/10/2012 8:30 AM  Sean Walter  has presented today for surgery, with the diagnosis of RIGHT SUBCONDYLAR MANDIBLE FRACTURE  The various methods of treatment have been discussed with the patient and family. After consideration of risks, benefits and other options for treatment, the patient has consented to  Procedure(s) (LRB): MANDIBULAR HARDWARE REMOVAL (Right) as a surgical intervention .  The patient's history has been re-reviewed, patient re-examined, no change in status, stable for surgery.  I have re-reviewed the patient's chart and labs.  Questions were answered to the patient's satisfaction.     Flo Shanks

## 2012-04-10 NOTE — Anesthesia Postprocedure Evaluation (Signed)
  Anesthesia Post-op Note  Patient: Sean Walter  Procedure(s) Performed: Procedure(s) (LRB): MANDIBULAR HARDWARE REMOVAL (Right)  Patient Location: PACU  Anesthesia Type: General  Level of Consciousness: awake, alert , oriented and patient cooperative  Airway and Oxygen Therapy: Patient Spontanous Breathing and Patient connected to nasal cannula oxygen  Post-op Pain: none  Post-op Assessment: Post-op Vital signs reviewed, Patient's Cardiovascular Status Stable, Respiratory Function Stable, Patent Airway, No signs of Nausea or vomiting and Pain level controlled  Post-op Vital Signs: stable  Complications: No apparent anesthesia complications

## 2012-04-11 ENCOUNTER — Other Ambulatory Visit: Payer: Self-pay | Admitting: Oncology

## 2012-04-12 ENCOUNTER — Encounter (HOSPITAL_COMMUNITY): Payer: Self-pay | Admitting: Otolaryngology

## 2012-04-18 ENCOUNTER — Telehealth: Payer: Self-pay | Admitting: Oncology

## 2012-04-18 NOTE — Telephone Encounter (Signed)
Per email from [facility] pt wishes to transfer from [facility] to [facility] - both physicians agree. Per email from [place] he can see pt on 7/17. Pt scheduled in 3:30 pm slot due to 2 pm slot taken. lmonvm for pt re new appt for [facility] 7/17 @ 3 pm. Pt still has lab appt for 7/5. New schedule mailed today.

## 2012-04-22 ENCOUNTER — Other Ambulatory Visit: Payer: Self-pay | Admitting: Lab

## 2012-04-26 ENCOUNTER — Other Ambulatory Visit: Payer: Self-pay | Admitting: Lab

## 2012-05-03 ENCOUNTER — Other Ambulatory Visit: Payer: Self-pay | Admitting: *Deleted

## 2012-05-03 DIAGNOSIS — D471 Chronic myeloproliferative disease: Secondary | ICD-10-CM

## 2012-05-03 DIAGNOSIS — R161 Splenomegaly, not elsewhere classified: Secondary | ICD-10-CM

## 2012-05-03 MED ORDER — HYDROCODONE-ACETAMINOPHEN 5-325 MG PO TABS: 1.0000 | ORAL_TABLET | Freq: Three times a day (TID) | ORAL | Status: DC | PRN

## 2012-05-03 NOTE — Telephone Encounter (Signed)
NOTIFIED PT. THAT HIS PRESCRIPTION HAS BEEN CALLED TO THE PHARMACY.

## 2012-05-08 ENCOUNTER — Ambulatory Visit (HOSPITAL_BASED_OUTPATIENT_CLINIC_OR_DEPARTMENT_OTHER): Payer: Self-pay | Admitting: Oncology

## 2012-05-08 ENCOUNTER — Other Ambulatory Visit (HOSPITAL_BASED_OUTPATIENT_CLINIC_OR_DEPARTMENT_OTHER): Payer: Self-pay | Admitting: Lab

## 2012-05-08 ENCOUNTER — Encounter: Payer: Self-pay | Admitting: Oncology

## 2012-05-08 ENCOUNTER — Telehealth: Payer: Self-pay | Admitting: Oncology

## 2012-05-08 VITALS — BP 131/80 | HR 74 | Temp 98.6°F | Ht 73.0 in | Wt 218.1 lb

## 2012-05-08 DIAGNOSIS — D471 Chronic myeloproliferative disease: Secondary | ICD-10-CM

## 2012-05-08 DIAGNOSIS — R161 Splenomegaly, not elsewhere classified: Secondary | ICD-10-CM

## 2012-05-08 DIAGNOSIS — D474 Osteomyelofibrosis: Secondary | ICD-10-CM

## 2012-05-08 DIAGNOSIS — D649 Anemia, unspecified: Secondary | ICD-10-CM

## 2012-05-08 DIAGNOSIS — B009 Herpesviral infection, unspecified: Secondary | ICD-10-CM

## 2012-05-08 HISTORY — DX: Herpesviral infection, unspecified: B00.9

## 2012-05-08 LAB — CBC WITH DIFFERENTIAL/PLATELET
BASO%: 2.7 % — ABNORMAL HIGH (ref 0.0–2.0)
Eosinophils Absolute: 0.2 10*3/uL (ref 0.0–0.5)
MCHC: 33.1 g/dL (ref 32.0–36.0)
MONO#: 0.7 10*3/uL (ref 0.1–0.9)
NEUT#: 7.3 10*3/uL — ABNORMAL HIGH (ref 1.5–6.5)
RBC: 4.6 10*6/uL (ref 4.20–5.82)
WBC: 9.3 10*3/uL (ref 4.0–10.3)
lymph#: 1 10*3/uL (ref 0.9–3.3)
nRBC: 3 % — ABNORMAL HIGH (ref 0–0)

## 2012-05-08 LAB — TECHNOLOGIST REVIEW

## 2012-05-08 NOTE — Progress Notes (Signed)
Hematology and Oncology Follow Up Visit  Sean Walter [id number] 1971-05-15 41 y.o. 05/08/2012 6:13 PM   Principle Diagnosis: Encounter Diagnoses  Name Primary?  . Myelofibrosis with myeloid metaplasia Yes  . Spleen enlargement      Interim History:  This patient has requested a change from primary hematologist from Dr.Ha to me.  41 year old man who presented in January 2012 with a three-month history of progressive left upper quadrant abdominal pain. He was found to have leukocytosis with white count 15,700, hemoglobin 15, platelet count 393,000. Nucleated red cells seen on peripheral blood film. CT scan of the abdomen showed a markedly enlarged spleen 18 x 11 cm transverse diameter and 25 cm cephalocaudad. He was referred to our practice and evaluated by Dr.Ha. Bone marrow aspiration and biopsy done 11/24/2010 was hypercellular. There was an increased number and abnormal morphology of the megakaryocytes. There were small areas of reticulin and collagen fibrosis in the marrow. Cytogenetics were normal male and BCR-ABL translocation was not detected. Additional studies showed negative acute hepatitis profile, negative HIV, no elevation of ACE, CMV negative. EBV negative. There was a significant elevation of LDH at 904. I cannot find results of a JAK-2 gene analysis.  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 Given his young age, he was referred to [facility] for a bone marrow transplant evaluation. He has a brother age 67 who uses drugs and is not a suitable donor, a sister age 27 who has lupus and is not suitable donor. He has twin daughters age 48 who have sickle cell trait and are not suitable donors. His other children are aged 39 and 5. He was entered into a transplant registry for a unrelated donor search.  He was started on JAKOFI  20 mg twice daily on 12/09/2010. He is tolerated the drug well with no obvious toxicities. He has had significant reduction in  his spleen.  Of note, blood counts done back in 2008 she platelet counts as high as 800,000. 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  Medications: reviewed  Allergies: No Known Allergies  Review of Systems: Constitutional:   No constitutional symptoms Respiratory: No cough or dyspnea Cardiovascular:  No chest pain or palpitations Gastrointestinal: No early satiety, no abdominal pain Genito-Urinary: Not questioned Musculoskeletal: No muscle or bone pain Neurologic: not questioned Skin: Not questioned Remaining ROS negative.  Physical Exam: Blood pressure 131/80, pulse 74, temperature 98.6 F (37 C), temperature source Oral, height 6\' 1"  (1.854 m), weight 218 lb 1.6 oz (98.93 kg). Wt Readings from Last 3 Encounters:  05/08/12 218 lb 1.6 oz (98.93 kg)  04/08/12 216 lb 7 oz (98.175 kg)  02/23/12 219 lb 14.4 oz (99.746 kg)     General appearance: [name] HENNT: Slight proptosis right eye, pharynx no erythema or exudate Lymph nodes: No adenopathy Breasts: Lungs: Clear to auscultation resonant to percussion Heart: Regular rhythm no murmur Abdomen: Soft nontender. I was unable to palpate and enlarged spleen supine oral in the right lateral decubitus position Extremities: No edema no calf tenderness Vascular: No cyanosis Neurologic: He is alert and oriented, cranial nerves normal, pupils equal round reactive to light, optic disc sharp, motor strength 5 over 5, reflexes 1+ symmetric, coordination normal, sensation intact to vibration over the fingertips by tuning fork exam Skin: No rash or ecchymosis  Lab Results: Lab Results  Component Value Date   WBC 9.3 05/08/2012   HGB 12.2* 05/08/2012   HCT 36.9* 05/08/2012  MCV 80.2 05/08/2012   PLT 97* 05/08/2012     Chemistry      Component Value Date/Time   NA 137 04/08/2012 1000   K 4.4 04/08/2012 1000   CL 101 04/08/2012 1000   CO2 26 04/08/2012 1000   BUN 10 04/08/2012 1000     CREATININE 1.14 04/08/2012 1000      Component Value Date/Time   CALCIUM 9.2 04/08/2012 1000   ALKPHOS 55 04/08/2012 1000   AST 56* 04/08/2012 1000   ALT 56* 04/08/2012 1000   BILITOT 0.4 04/08/2012 1000       Radiological Studies: No results found.  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 He has a muscular abdomen and I was unable to convincingly feel his spleen today. Plan: Diagnosis and treatment options  reviewed with the patient today.  He understands that the only curative therapy for this blood disorder is a bone marrow transplant. I encouraged him to stay in touch with [facility] frequently. For now, we will continue his JAKOFI. I will get a followup ultrasound in September.  He is trying to support his wife and 5 children. 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 He understands there may be some risk involved if there is blunt trauma to this abdomen but he is willing to accept this.   CC:. Dr. Verita Schneiders at [facility], MD 7/17/20136:13 PM

## 2012-05-08 NOTE — Telephone Encounter (Signed)
gve the pt his sept 2013 appt calendar along with the [place] abd appt

## 2012-05-10 ENCOUNTER — Encounter: Payer: Self-pay | Admitting: *Deleted

## 2012-05-20 ENCOUNTER — Other Ambulatory Visit: Payer: Self-pay | Admitting: Lab

## 2012-05-20 ENCOUNTER — Emergency Department (HOSPITAL_COMMUNITY)
Admission: EM | Admit: 2012-05-20 | Discharge: 2012-05-20 | Disposition: A | Discharge status: Home / Self Care | Payer: Self-pay | Attending: Emergency Medicine | Admitting: Emergency Medicine

## 2012-05-20 ENCOUNTER — Encounter (HOSPITAL_COMMUNITY): Payer: Self-pay | Admitting: Emergency Medicine

## 2012-05-20 DIAGNOSIS — L089 Local infection of the skin and subcutaneous tissue, unspecified: Secondary | ICD-10-CM

## 2012-05-20 DIAGNOSIS — J45909 Unspecified asthma, uncomplicated: Secondary | ICD-10-CM | POA: Insufficient documentation

## 2012-05-20 DIAGNOSIS — F17200 Nicotine dependence, unspecified, uncomplicated: Secondary | ICD-10-CM | POA: Insufficient documentation

## 2012-05-20 DIAGNOSIS — L03319 Cellulitis of trunk, unspecified: Secondary | ICD-10-CM | POA: Insufficient documentation

## 2012-05-20 DIAGNOSIS — L02219 Cutaneous abscess of trunk, unspecified: Secondary | ICD-10-CM | POA: Insufficient documentation

## 2012-05-20 MED ORDER — HYDROCODONE-ACETAMINOPHEN 5-325 MG PO TABS: 1.0000 | ORAL_TABLET | Freq: Four times a day (QID) | ORAL | Status: AC | PRN

## 2012-05-20 MED ORDER — SULFAMETHOXAZOLE-TRIMETHOPRIM 800-160 MG PO TABS: 1.0000 | ORAL_TABLET | Freq: Two times a day (BID) | ORAL | Status: DC

## 2012-05-20 NOTE — ED Provider Notes (Cosign Needed)
History   This chart was scribed for Benny Lennert, MD by Sofie Rower. The patient was seen in room TR06C/TR06C and the patient's care was started at 5:27 PM     CSN: [id number]  Arrival date & time 05/20/12  1615   None     Chief Complaint  Patient presents with  . Wound Infection    (Consider location/radiation/quality/duration/timing/severity/associated sxs/prior treatment) Patient is a 41 y.o. male presenting with abscess. The history is provided by the patient. No language interpreter was used.  Abscess  This is a new problem. The current episode started less than one week ago. The onset was gradual. The problem occurs rarely. The problem has been gradually worsening. The abscess is present on the groin (right groin). The problem is moderate. The abscess is characterized by painfulness. It is unknown what he was exposed to. The abscess first occurred at home. Pertinent negatives include no fever, no vomiting and no cough. There were no sick contacts. He has received no recent medical care.    Past Medical History  Diagnosis Date  . Asthma, cold induced   . Herpes   . Asthma   . No pertinent past medical history     enlarged spleen  . Spleen enlargement     myofibrosis need bonemarrow transplant  . Myelofibrosis     followed by Dr. Jethro Bolus  . 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 Mandible fracture surgery   . Mandibular hardware removal 04/10/2012    Procedure: MANDIBULAR HARDWARE REMOVAL;  Surgeon: Flo Shanks, MD;  Location: [facility] OR;  Service: ENT;  Laterality: Right;    No family history on file.  History  Substance Use Topics  . Smoking status: Current Some Day Smoker  . Smokeless tobacco: Not on file  . Alcohol Use: Yes     daily       Review of Systems  Constitutional: Negative for fever.  Respiratory: Negative for cough.   Gastrointestinal: Negative for vomiting.  All other systems reviewed  and are negative.    Allergies  Review of patient's allergies indicates no known allergies.  Home Medications   Current Outpatient Rx  Name Route Sig Dispense Refill  . ACYCLOVIR 400 MG PO TABS Oral Take 400 mg by mouth 3 (three) times daily as needed. OUTBREAK    . ALBUTEROL SULFATE HFA 108 (90 BASE) MCG/ACT IN AERS Inhalation Inhale 2 puffs into the lungs every 6 (six) hours as needed. WHEEZING     . HYDROCODONE-ACETAMINOPHEN 5-325 MG PO TABS Oral Take 1 tablet by mouth every 8 (eight) hours as needed. 90 tablet 0    CALLED TO PHARMACY  . RUXOLITINIB PHOSPHATE 15 MG PO TABS Oral Take 15 mg by mouth 2 (two) times daily.      BP 150/85  Pulse 93  Temp 98.4 F (36.9 C) (Oral)  Resp 16  SpO2 94%  Physical Exam  Nursing note and vitals reviewed. Constitutional: He is oriented to person, place, and time. He appears well-developed.  HENT:  Head: Normocephalic.  Eyes: Conjunctivae are normal.  Neck: No tracheal deviation present.  Cardiovascular:  No murmur heard. Genitourinary:       Small 1 cm tender indurated area located at the right groin.   Musculoskeletal: Normal range of motion.  Neurological: He is oriented to person, place, and time.  Skin: Skin is warm.  Psychiatric: He has a normal mood and  affect.    ED Course  Procedures (including critical care time)  DIAGNOSTIC STUDIES: Oxygen Saturation is 94% on room air, low by my interpretation.    COORDINATION OF CARE:  5:30PM- EDP at bedside discusses treatment plan concerning pain management, application of anti-biotics.     Labs Reviewed - No data to display No results found.   No diagnosis found.    MDM      The chart was scribed for me under my direct supervision.  I personally performed the history, physical, and medical decision making and all procedures in the evaluation of this patient.Benny Lennert, MD 05/20/12 [phone]

## 2012-05-20 NOTE — ED Notes (Signed)
Has  abcess rt groin x 2-3 days has mashed he states has hx of enlarged spleen

## 2012-05-23 ENCOUNTER — Encounter: Payer: Self-pay | Admitting: Oncology

## 2012-05-23 NOTE — Progress Notes (Signed)
Patient approved for epp discount 100% for family of 3 income 9024.00 approved 05/23/12 to 11/23/12

## 2012-05-23 NOTE — Progress Notes (Signed)
Patient came by and brought epp application in. Will process and mail results to patient.

## 2012-05-27 ENCOUNTER — Other Ambulatory Visit: Payer: Self-pay | Admitting: Lab

## 2012-06-05 ENCOUNTER — Other Ambulatory Visit: Payer: Self-pay | Admitting: Oncology

## 2012-06-05 ENCOUNTER — Other Ambulatory Visit: Payer: Self-pay | Admitting: *Deleted

## 2012-06-05 DIAGNOSIS — D471 Chronic myeloproliferative disease: Secondary | ICD-10-CM

## 2012-06-05 DIAGNOSIS — R161 Splenomegaly, not elsewhere classified: Secondary | ICD-10-CM

## 2012-06-05 MED ORDER — HYDROCODONE-ACETAMINOPHEN 5-325 MG PO TABS: 1.0000 | ORAL_TABLET | Freq: Three times a day (TID) | ORAL | Status: DC | PRN

## 2012-06-10 ENCOUNTER — Other Ambulatory Visit: Payer: Self-pay | Admitting: *Deleted

## 2012-06-10 DIAGNOSIS — D474 Osteomyelofibrosis: Secondary | ICD-10-CM

## 2012-06-10 MED ORDER — RUXOLITINIB PHOSPHATE 15 MG PO TABS
15.0000 mg | ORAL_TABLET | Freq: Two times a day (BID) | ORAL | Status: DC
Start: 2012-06-10 — End: 2012-07-02

## 2012-06-25 ENCOUNTER — Other Ambulatory Visit: Payer: Self-pay | Admitting: Lab

## 2012-06-25 ENCOUNTER — Other Ambulatory Visit (HOSPITAL_COMMUNITY): Payer: Self-pay

## 2012-06-26 ENCOUNTER — Telehealth: Payer: Self-pay | Admitting: Oncology

## 2012-06-26 NOTE — Telephone Encounter (Signed)
Called pt left message regarding appt on 07/01/12 lab and U/S

## 2012-06-27 ENCOUNTER — Other Ambulatory Visit: Payer: Self-pay | Admitting: Lab

## 2012-06-27 ENCOUNTER — Ambulatory Visit: Payer: Self-pay | Admitting: Oncology

## 2012-07-01 ENCOUNTER — Other Ambulatory Visit (HOSPITAL_BASED_OUTPATIENT_CLINIC_OR_DEPARTMENT_OTHER): Payer: Self-pay | Admitting: Lab

## 2012-07-01 ENCOUNTER — Ambulatory Visit (HOSPITAL_COMMUNITY)
Admission: RE | Admit: 2012-07-01 | Discharge: 2012-07-01 | Disposition: A | Discharge status: Home / Self Care | Payer: Self-pay | Source: Ambulatory Visit | Attending: Oncology | Admitting: Oncology

## 2012-07-01 DIAGNOSIS — R161 Splenomegaly, not elsewhere classified: Secondary | ICD-10-CM | POA: Insufficient documentation

## 2012-07-01 DIAGNOSIS — D474 Osteomyelofibrosis: Secondary | ICD-10-CM

## 2012-07-01 DIAGNOSIS — D649 Anemia, unspecified: Secondary | ICD-10-CM

## 2012-07-01 DIAGNOSIS — D471 Chronic myeloproliferative disease: Secondary | ICD-10-CM | POA: Insufficient documentation

## 2012-07-01 LAB — COMPREHENSIVE METABOLIC PANEL (CC13)
ALT: 28 U/L (ref 0–55)
CO2: 24 mEq/L (ref 22–29)
Chloride: 103 mEq/L (ref 98–107)
Potassium: 4.1 mEq/L (ref 3.5–5.1)
Sodium: 138 mEq/L (ref 136–145)
Total Bilirubin: 1 mg/dL (ref 0.20–1.20)
Total Protein: 7.8 g/dL (ref 6.4–8.3)

## 2012-07-01 LAB — CBC WITH DIFFERENTIAL/PLATELET
Eosinophils Absolute: 0.1 10*3/uL (ref 0.0–0.5)
MONO#: 0.7 10*3/uL (ref 0.1–0.9)
MONO%: 5 % (ref 0.0–14.0)
NEUT#: 12.2 10*3/uL — ABNORMAL HIGH (ref 1.5–6.5)
RBC: 4.91 10*6/uL (ref 4.20–5.82)
RDW: 17 % — ABNORMAL HIGH (ref 11.0–14.6)
WBC: 13.9 10*3/uL — ABNORMAL HIGH (ref 4.0–10.3)
lymph#: 0.7 10*3/uL — ABNORMAL LOW (ref 0.9–3.3)
nRBC: 2 % — ABNORMAL HIGH (ref 0–0)

## 2012-07-01 LAB — URIC ACID (CC13): Uric Acid, Serum: 7 mg/dl (ref 2.6–7.4)

## 2012-07-01 LAB — MORPHOLOGY: PLT EST: ADEQUATE

## 2012-07-02 ENCOUNTER — Ambulatory Visit (HOSPITAL_BASED_OUTPATIENT_CLINIC_OR_DEPARTMENT_OTHER): Payer: Self-pay | Admitting: Oncology

## 2012-07-02 ENCOUNTER — Other Ambulatory Visit: Payer: Self-pay | Admitting: *Deleted

## 2012-07-02 ENCOUNTER — Telehealth: Payer: Self-pay | Admitting: Oncology

## 2012-07-02 VITALS — BP 145/85 | HR 91 | Temp 97.6°F | Resp 20 | Ht 72.0 in | Wt 211.7 lb

## 2012-07-02 DIAGNOSIS — D474 Osteomyelofibrosis: Secondary | ICD-10-CM

## 2012-07-02 DIAGNOSIS — D471 Chronic myeloproliferative disease: Secondary | ICD-10-CM

## 2012-07-02 MED ORDER — RUXOLITINIB PHOSPHATE 20 MG PO TABS: 20.0000 mg | ORAL_TABLET | Freq: Two times a day (BID) | ORAL | Status: DC

## 2012-07-02 NOTE — Telephone Encounter (Signed)
Gave pt appt for labs and MD visit in march 2014

## 2012-07-02 NOTE — Telephone Encounter (Signed)
Gave pt appt for November 2013 lab and U/S, NPO after midnight, then see MD after a few days,

## 2012-07-03 ENCOUNTER — Other Ambulatory Visit: Payer: Self-pay | Admitting: *Deleted

## 2012-07-03 DIAGNOSIS — R161 Splenomegaly, not elsewhere classified: Secondary | ICD-10-CM

## 2012-07-03 DIAGNOSIS — D471 Chronic myeloproliferative disease: Secondary | ICD-10-CM

## 2012-07-03 MED ORDER — HYDROCODONE-ACETAMINOPHEN 5-325 MG PO TABS: 1.0000 | ORAL_TABLET | Freq: Three times a day (TID) | ORAL | Status: DC | PRN

## 2012-07-03 NOTE — Progress Notes (Signed)
Hematology and Oncology Follow Up Visit  Sean Walter [id number] [birth year] 41 y.o. 07/03/2012 9:41 AM   Principle Diagnosis: Encounter Diagnosis  Name Primary?  . Primary myelofibrosis Yes     Interim History:   Followup visit for this 41 year old man with a myeloproliferative disorder diagnosed in January 2012 when he presented with left upper quadrant abdominal discomfort with findings of splenomegaly. He was not anemic. He had mild leukocytosis. Normal platelet count. Serum LDH elevated at 904. He had a hypercellular bone marrow done 11/16/2010 with abnormal number and morphology of megakaryocytes. Based on some small areas of increased reticulin and collagen fibrosis he was called myelofibrosis. Reviewing previous blood counts prior to his diagnosis back as far as 2008 I noted that his platelet count was as high as 800,000. 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 Please see my summary note dated July 17 for additional details of his evaluation. Of note, cytogenetic studies on the bone marrow were normal.  He was referred to [facility] in consideration of a bone marrow transplant but does not have any suitable donors from his family. He was started on a trial of JAKAFI initially 20 mg twice daily beginning 12/09/2010. He has tolerated the drug well but dose adjustments were made for myelosuppression. He is currently on 15 mg twice daily.  He has had remarkably stable blood counts. There's been some decrease in his spleen size but different radiologists give different measurements and in different dimensions. Ultrasound done in anticipation of today's visit compared with the study done 2 months ago shows spleen approximately the same with a transverse diameter of 18 cm.  He continues to have intermittent left upper quadrant abdominal discomfort and takes periodic Vicodin for the pain. No other interim complaints. He has tried to go back to work part-time.  Medications:  reviewed  Allergies: No Known Allergies  Review of Systems: Constitutional:  No constitutional symptoms  Respiratory: No cough or dyspnea Cardiovascular:  No chest pain or palpitations Gastrointestinal: See above Genito-Urinary: No urinary tract symptoms Musculoskeletal: No muscle or bone pain Neurologic: No headache or change in vision Skin: No rash or ecchymosis Remaining ROS negative.  Physical Exam: Blood pressure 145/85, pulse 91, temperature 97.6 F (36.4 C), temperature source Oral, resp. rate 20, height 6' (1.829 m), weight 211 lb 11.2 oz (96.026 kg). Wt Readings from Last 3 Encounters:  07/02/12 211 lb 11.2 oz (96.026 kg)  05/08/12 218 lb 1.6 oz (98.93 kg)  04/08/12 216 lb 7 oz (98.175 kg)     General appearance: Muscular young African American man HENNT: Pharynx no erythema or exudate Lymph nodes: No adenopathy Breasts: Lungs: Clear to auscultation resonant to percussion Heart: Regular rhythm no murmur Abdomen: Soft nontender. Difficult abdomen to examine but spleen approximately 8 cm below left costal margin Extremities: No edema no calf tenderness Vascular: No cyanosis Neurologic: Mental status intact, PERRLA, optic discs sharp, no hemorrhage or exudate, motor strength 5 over 5, reflexes 2+ symmetric. Skin: No rash or ecchymosis  Lab Results: Lab Results  Component Value Date   WBC 13.9* 07/01/2012   HGB 13.2 07/01/2012   HCT 40.7 07/01/2012   MCV 82.9 07/01/2012   PLT 147 07/01/2012     Chemistry      Component Value Date/Time   NA 138 07/01/2012 0812   NA 137 04/08/2012 1000   K 4.1 07/01/2012 0812   K 4.4 04/08/2012 1000   CL 103 07/01/2012 0812   CL 101 04/08/2012 1000  CO2 24 07/01/2012 0812   CO2 26 04/08/2012 1000   BUN 8.0 07/01/2012 0812   BUN 10 04/08/2012 1000   CREATININE 1.3 07/01/2012 0812   CREATININE 1.14 04/08/2012 1000      Component Value Date/Time   CALCIUM 9.4 07/01/2012 0812   CALCIUM 9.2 04/08/2012 1000   ALKPHOS 98 07/01/2012 0812   ALKPHOS 55  04/08/2012 1000   AST 22 07/01/2012 0812   AST 56* 04/08/2012 1000   ALT 28 07/01/2012 0812   ALT 56* 04/08/2012 1000   BILITOT 1.00 07/01/2012 0812   BILITOT 0.4 04/08/2012 1000       Radiological Studies: US Abdomen Limited  07/01/2012  *RADIOLOGY REPORT*  Clinical Data: Splenomegaly  ABDOMEN ULTRASOUND LIMITED  Technique: Imaging of the spleen was performed.  Comparison:  02/22/2012  Findings: The spleen again is enlarged and measures 23.0 x 18.2 x 7.4 cm with volume of 1547 ml.  The 1.9 cm hyperechoic lesion is not significantly changed.  IMPRESSION: Slightly improved splenomegaly.  Stable hyperechoic lesion.   Original Report Authenticated By: Donavan Burnet, M.D.     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 Blood counts have been very stable. Minimal decrease in his spleen size on current dose of JAKAFI. I'm going to increase him back to the 20 mg twice daily dose and continue to monitor blood counts closely. I'll see him back in 2 months and repeat an ultrasound of his abdomen at that time.   CC:. Dr. Verita Schneiders at [facility], MD 9/11/20139:41 AM

## 2012-07-18 ENCOUNTER — Other Ambulatory Visit: Payer: Self-pay | Admitting: *Deleted

## 2012-07-18 MED ORDER — CEPHALEXIN 500 MG PO CAPS: ORAL_CAPSULE | ORAL | Status: DC

## 2012-07-29 ENCOUNTER — Encounter (HOSPITAL_COMMUNITY): Payer: Self-pay | Admitting: Emergency Medicine

## 2012-07-29 ENCOUNTER — Emergency Department (HOSPITAL_COMMUNITY)
Admission: EM | Admit: 2012-07-29 | Discharge: 2012-07-29 | Disposition: A | Discharge status: Home / Self Care | Payer: Self-pay | Attending: Emergency Medicine | Admitting: Emergency Medicine

## 2012-07-29 DIAGNOSIS — D7581 Myelofibrosis: Secondary | ICD-10-CM | POA: Insufficient documentation

## 2012-07-29 DIAGNOSIS — R1013 Epigastric pain: Secondary | ICD-10-CM | POA: Insufficient documentation

## 2012-07-29 DIAGNOSIS — J45909 Unspecified asthma, uncomplicated: Secondary | ICD-10-CM | POA: Insufficient documentation

## 2012-07-29 DIAGNOSIS — F17200 Nicotine dependence, unspecified, uncomplicated: Secondary | ICD-10-CM | POA: Insufficient documentation

## 2012-07-29 DIAGNOSIS — B009 Herpesviral infection, unspecified: Secondary | ICD-10-CM | POA: Insufficient documentation

## 2012-07-29 DIAGNOSIS — D234 Other benign neoplasm of skin of scalp and neck: Secondary | ICD-10-CM | POA: Insufficient documentation

## 2012-07-29 DIAGNOSIS — K2970 Gastritis, unspecified, without bleeding: Secondary | ICD-10-CM

## 2012-07-29 LAB — COMPREHENSIVE METABOLIC PANEL
ALT: 28 U/L (ref 0–53)
AST: 37 U/L (ref 0–37)
Albumin: 3.7 g/dL (ref 3.5–5.2)
Alkaline Phosphatase: 65 U/L (ref 39–117)
BUN: 10 mg/dL (ref 6–23)
CO2: 28 mEq/L (ref 19–32)
Calcium: 9 mg/dL (ref 8.4–10.5)
Chloride: 102 mEq/L (ref 96–112)
Creatinine, Ser: 1.23 mg/dL (ref 0.50–1.35)
GFR calc Af Amer: 83 mL/min — ABNORMAL LOW (ref >90)
GFR calc non Af Amer: 72 mL/min — ABNORMAL LOW (ref >90)
Glucose, Bld: 109 mg/dL — ABNORMAL HIGH (ref 70–99)
Potassium: 3.9 mEq/L (ref 3.5–5.1)
Sodium: 138 mEq/L (ref 135–145)
Total Bilirubin: 0.5 mg/dL (ref 0.3–1.2)
Total Protein: 7.1 g/dL (ref 6.0–8.3)

## 2012-07-29 LAB — URINALYSIS, ROUTINE W REFLEX MICROSCOPIC
Bilirubin Urine: NEGATIVE
Glucose, UA: NEGATIVE mg/dL
Hgb urine dipstick: NEGATIVE
Ketones, ur: NEGATIVE mg/dL
Leukocytes, UA: NEGATIVE
Nitrite: NEGATIVE
Protein, ur: NEGATIVE mg/dL
Specific Gravity, Urine: 1.025 (ref 1.005–1.030)
Urobilinogen, UA: 0.2 mg/dL (ref 0.0–1.0)
pH: 6 (ref 5.0–8.0)

## 2012-07-29 LAB — CBC WITH DIFFERENTIAL/PLATELET
Basophils Absolute: 0.1 10*3/uL (ref 0.0–0.1)
Basophils Relative: 1 % (ref 0–1)
Eosinophils Absolute: 0.2 10*3/uL (ref 0.0–0.7)
Eosinophils Relative: 1 % (ref 0–5)
HCT: 39.7 % (ref 39.0–52.0)
Hemoglobin: 12.8 g/dL — ABNORMAL LOW (ref 13.0–17.0)
Lymphocytes Relative: 4 % — ABNORMAL LOW (ref 12–46)
Lymphs Abs: 0.4 10*3/uL — ABNORMAL LOW (ref 0.7–4.0)
MCH: 26.8 pg (ref 26.0–34.0)
MCHC: 32.2 g/dL (ref 30.0–36.0)
MCV: 83.2 fL (ref 78.0–100.0)
Monocytes Absolute: 0.3 10*3/uL (ref 0.1–1.0)
Monocytes Relative: 3 % (ref 3–12)
Neutro Abs: 10.5 10*3/uL — ABNORMAL HIGH (ref 1.7–7.7)
Neutrophils Relative %: 91 % — ABNORMAL HIGH (ref 43–77)
Platelets: 132 10*3/uL — ABNORMAL LOW (ref 150–400)
RBC: 4.77 MIL/uL (ref 4.22–5.81)
RDW: 16.1 % — ABNORMAL HIGH (ref 11.5–15.5)
WBC: 11.5 10*3/uL — ABNORMAL HIGH (ref 4.0–10.5)

## 2012-07-29 LAB — LIPASE, BLOOD: Lipase: 27 U/L (ref 11–59)

## 2012-07-29 MED ORDER — FAMOTIDINE 20 MG PO TABS: 20.0000 mg | ORAL_TABLET | Freq: Two times a day (BID) | ORAL | Status: DC

## 2012-07-29 MED ORDER — SUCRALFATE 1 G PO TABS: 1.0000 g | ORAL_TABLET | Freq: Four times a day (QID) | ORAL | Status: DC

## 2012-07-29 NOTE — ED Notes (Signed)
Patient complaining of abdominal pain; denies nausea and vomiting; reports episodes of diarrhea.  Patient reports that he has had pain like this in the past when he has taken ibuprofen (patient denies taking any ibuprofen recently; reports taking hydrocodone for an enlarged spleen).  Patient reports that this abdominal pain feels different than his spleen pain.  Patient reports that pain comes and goes around every 20 minutse.  Patient alert and oriented x4; PERRL present.  Upon arrival to room, patient changed into gown.  Will continue to monitor.

## 2012-07-29 NOTE — ED Notes (Signed)
NAD noted at time of d/c home 

## 2012-07-29 NOTE — ED Notes (Signed)
PT. REPORTS MID ABDOMINAL PAIN /LOW BACK PAIN FOR 2 DAYS WITH DIARRHEA.

## 2012-07-29 NOTE — ED Provider Notes (Signed)
History     CSN: [id number]  Arrival date & time 07/29/12  0508   First MD Initiated Contact with Patient 07/29/12 [phone]      Chief Complaint  Patient presents with  . Abdominal Pain    (Consider location/radiation/quality/duration/timing/severity/associated sxs/prior treatment) HPI The patient presents to the emergency department with epigastric pain.  The pain started Saturday morning with no known triggers.  The pain is sharp and 8/10 at it's worse.  Episodes of pain last 20-30 minutes and will fade for 30 minutes.  He has had some pain relief with hydrocodone.  He reports diarrhea and lower back pain.  He denies nausea, vomiting, fever, chest pain, sob, urinary changes, and radiating pain.  The patient is currently not experiencing any pain.    Past Medical History  Diagnosis Date  . Asthma, cold induced   . Herpes   . Asthma   . No pertinent past medical history     enlarged spleen  . Spleen enlargement     myofibrosis need bonemarrow transplant  . Myelofibrosis     followed by Dr. Jethro Bolus  . 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 Mandible fracture surgery   . Mandibular hardware removal 04/10/2012    Procedure: MANDIBULAR HARDWARE REMOVAL;  Surgeon: Flo Shanks, MD;  Location: [facility] OR;  Service: ENT;  Laterality: Right;    No family history on file.  History  Substance Use Topics  . Smoking status: Current Some Day Smoker  . Smokeless tobacco: Not on file  . Alcohol Use: Yes     daily       Review of Systems All pertinent positives and negatives in the history of present illness   Allergies  Review of patient's allergies indicates no known allergies.  Home Medications   Current Outpatient Rx  Name Route Sig Dispense Refill  . ACYCLOVIR 400 MG PO TABS Oral Take 400 mg by mouth 3 (three) times daily as needed. OUTBREAK    . ALBUTEROL SULFATE HFA 108 (90 BASE) MCG/ACT IN AERS Inhalation Inhale 2  puffs into the lungs every 6 (six) hours as needed. WHEEZING     . HYDROCODONE-ACETAMINOPHEN 5-325 MG PO TABS Oral Take 1 tablet by mouth every 8 (eight) hours as needed. 90 tablet 0    CALLED TO PHARMACY  . RUXOLITINIB PHOSPHATE 20 MG PO TABS Oral Take 1 tablet (20 mg total) by mouth 2 (two) times daily. 60 tablet 3    Script given to [facility] in [facility] for patient  ...  . CEPHALEXIN 500 MG PO CAPS  Take one tablet by mouth three times a day for 5 days 15 capsule 1    Written prescription given on 07/02/12    BP 151/75  Pulse 78  Temp 98.1 F (36.7 C) (Axillary)  Resp 16  SpO2 96%  Physical Exam  Constitutional: He is oriented to person, place, and time. He appears well-developed and well-nourished. No distress.  HENT:  Head: Normocephalic and atraumatic.  Mouth/Throat: Oropharynx is clear and moist.  Eyes: Pupils are equal, round, and reactive to light.  Neck: Normal range of motion. Neck supple.  Cardiovascular: Normal rate, regular rhythm and normal heart sounds.   Pulmonary/Chest: Effort normal and breath sounds normal. No respiratory distress. He has no wheezes. He has no rales. He exhibits no tenderness.  Abdominal: Soft. Normal appearance and bowel sounds are normal. He exhibits no distension and  no mass. There is no hepatosplenomegaly. There is no tenderness. There is no rebound, no guarding and no CVA tenderness. No hernia.  Musculoskeletal: Normal range of motion. He exhibits no edema and no tenderness.  Neurological: He is alert and oriented to person, place, and time.  Skin: Skin is warm and dry. No rash noted.    ED Course  Procedures (including critical care time)   Labs Reviewed  COMPREHENSIVE METABOLIC PANEL  CBC WITH DIFFERENTIAL  URINALYSIS, ROUTINE W REFLEX MICROSCOPIC  LIPASE, BLOOD    The patient has no pain at this time. The pain seems to come and go. He will be referred to GI for further evaluation. The patient has no RUQ pain on exam and we did not  do any ultrasound due to the fact that the patient has no current symptoms. The patient is advised to follow up with Derm for his skin nevi on the R posterior scalp.   MDM  MDM Reviewed: nursing note and vitals Interpretation: labs            Carlyle Dolly, PA-C 08/01/12 1506

## 2012-08-01 NOTE — ED Provider Notes (Signed)
Medical screening examination/treatment/procedure(s) were performed by non-physician practitioner and as supervising physician I was immediately available for consultation/collaboration.  Derwood Kaplan, MD 08/01/12 1734

## 2012-08-05 ENCOUNTER — Other Ambulatory Visit: Payer: Self-pay | Admitting: Oncology

## 2012-08-05 DIAGNOSIS — R161 Splenomegaly, not elsewhere classified: Secondary | ICD-10-CM

## 2012-08-05 DIAGNOSIS — D7581 Myelofibrosis: Secondary | ICD-10-CM

## 2012-09-02 ENCOUNTER — Other Ambulatory Visit: Payer: Self-pay | Admitting: Oncology

## 2012-09-02 ENCOUNTER — Encounter (HOSPITAL_COMMUNITY): Payer: Self-pay | Admitting: Emergency Medicine

## 2012-09-02 ENCOUNTER — Emergency Department (HOSPITAL_COMMUNITY)
Admission: EM | Admit: 2012-09-02 | Discharge: 2012-09-02 | Disposition: A | Discharge status: Home / Self Care | Payer: Self-pay | Attending: Emergency Medicine | Admitting: Emergency Medicine

## 2012-09-02 ENCOUNTER — Other Ambulatory Visit (HOSPITAL_BASED_OUTPATIENT_CLINIC_OR_DEPARTMENT_OTHER): Payer: Self-pay | Admitting: Lab

## 2012-09-02 ENCOUNTER — Ambulatory Visit (HOSPITAL_COMMUNITY)
Admission: RE | Admit: 2012-09-02 | Discharge: 2012-09-02 | Disposition: A | Discharge status: Home / Self Care | Payer: Self-pay | Source: Ambulatory Visit | Attending: Oncology | Admitting: Oncology

## 2012-09-02 DIAGNOSIS — D474 Osteomyelofibrosis: Secondary | ICD-10-CM

## 2012-09-02 DIAGNOSIS — D471 Chronic myeloproliferative disease: Secondary | ICD-10-CM

## 2012-09-02 DIAGNOSIS — R161 Splenomegaly, not elsewhere classified: Secondary | ICD-10-CM

## 2012-09-02 DIAGNOSIS — L089 Local infection of the skin and subcutaneous tissue, unspecified: Secondary | ICD-10-CM | POA: Insufficient documentation

## 2012-09-02 DIAGNOSIS — D7581 Myelofibrosis: Secondary | ICD-10-CM | POA: Insufficient documentation

## 2012-09-02 DIAGNOSIS — J45909 Unspecified asthma, uncomplicated: Secondary | ICD-10-CM | POA: Insufficient documentation

## 2012-09-02 DIAGNOSIS — Z79899 Other long term (current) drug therapy: Secondary | ICD-10-CM | POA: Insufficient documentation

## 2012-09-02 DIAGNOSIS — F17200 Nicotine dependence, unspecified, uncomplicated: Secondary | ICD-10-CM | POA: Insufficient documentation

## 2012-09-02 DIAGNOSIS — Z8619 Personal history of other infectious and parasitic diseases: Secondary | ICD-10-CM | POA: Insufficient documentation

## 2012-09-02 LAB — COMPREHENSIVE METABOLIC PANEL (CC13)
ALT: 30 U/L (ref 0–55)
AST: 20 U/L (ref 5–34)
Alkaline Phosphatase: 78 U/L (ref 40–150)
Chloride: 105 mEq/L (ref 98–107)
Creatinine: 1.4 mg/dL — ABNORMAL HIGH (ref 0.7–1.3)
Total Bilirubin: 0.71 mg/dL (ref 0.20–1.20)

## 2012-09-02 LAB — CBC WITH DIFFERENTIAL/PLATELET
Basophils Absolute: 0.1 10*3/uL (ref 0.0–0.1)
Eosinophils Absolute: 0.1 10*3/uL (ref 0.0–0.5)
HCT: 41.2 % (ref 38.4–49.9)
HGB: 13.1 g/dL (ref 13.0–17.1)
LYMPH%: 2.5 % — ABNORMAL LOW (ref 14.0–49.0)
MCV: 82.9 fL (ref 79.3–98.0)
MONO#: 0.5 10*3/uL (ref 0.1–0.9)
MONO%: 4.5 % (ref 0.0–14.0)
NEUT#: 10.8 10*3/uL — ABNORMAL HIGH (ref 1.5–6.5)
Platelets: 122 10*3/uL — ABNORMAL LOW (ref 140–400)

## 2012-09-02 MED ORDER — CEPHALEXIN 500 MG PO CAPS: 500.0000 mg | ORAL_CAPSULE | Freq: Four times a day (QID) | ORAL | Status: DC

## 2012-09-02 NOTE — ED Notes (Signed)
Pt presenting to ed with c/o rash to his arm. Pt states x 4 days s/p getting a tattoo.

## 2012-09-02 NOTE — ED Provider Notes (Signed)
History     CSN: [id number]  Arrival date & time 09/02/12  4098   First MD Initiated Contact with Patient 09/02/12 [phone]      Chief Complaint  Patient presents with  . Rash    (Consider location/radiation/quality/duration/timing/severity/associated sxs/prior treatment) HPI Comments: Patient is a 41 year old male who presents with a 4 day history of rash located to his right forearm. The rash started gradually and progressively worsened since getting a new tattoo 4 days ago. The rash is located on his right forearm and is not spreading. Patient has tried nothing without relief. Patient denies new exposures to medications, soaps, lotions, detergent. Patient reports clean needle use when getting his tattoo. Patient also had a tattoo to his other forearm on the same day without any current signs of rash. Patient reports associated itching and redness and denies pain. No aggravating/alleviating factors. Patient has never had this before and denies history of abscess/rash. Patient denies fever, chills, NVD, sore throat, oral lesions, ocular involvement, throat closing, wheezing, SOB, chest pain, abdominal pain.     Patient is a 41 y.o. male presenting with rash.  Rash     Past Medical History  Diagnosis Date  . Asthma, cold induced   . Herpes   . Asthma   . No pertinent past medical history     enlarged spleen  . Spleen enlargement     myofibrosis need bonemarrow transplant  . Myelofibrosis     followed by Dr. Jethro Bolus  . 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 Mandible fracture surgery   . Mandibular hardware removal 04/10/2012    Procedure: MANDIBULAR HARDWARE REMOVAL;  Surgeon: Flo Shanks, MD;  Location: [facility] OR;  Service: ENT;  Laterality: Right;    No family history on file.  History  Substance Use Topics  . Smoking status: Current Some Day Smoker    Types: Cigarettes  . Smokeless tobacco: Not on file  .  Alcohol Use: Yes     Comment: daily       Review of Systems  Skin: Positive for rash.  All other systems reviewed and are negative.    Allergies  Review of patient's allergies indicates no known allergies.  Home Medications   Current Outpatient Rx  Name  Route  Sig  Dispense  Refill  . ACYCLOVIR 400 MG PO TABS   Oral   Take 400 mg by mouth 3 (three) times daily as needed. OUTBREAK         . ALBUTEROL SULFATE HFA 108 (90 BASE) MCG/ACT IN AERS   Inhalation   Inhale 2 puffs into the lungs every 6 (six) hours as needed. WHEEZING          . HYDROCODONE-ACETAMINOPHEN 5-325 MG PO TABS   Oral   Take 1 tablet by mouth every 8 (eight) hours as needed. Pain         . RUXOLITINIB PHOSPHATE 20 MG PO TABS   Oral   Take 20 mg by mouth 2 (two) times daily.           BP 152/77  Pulse 79  Temp 98.7 F (37.1 C) (Oral)  Resp 20  SpO2 100%  Physical Exam  Nursing note and vitals reviewed. Constitutional: He is oriented to person, place, and time. He appears well-developed and well-nourished. No distress.  HENT:  Head: Normocephalic and atraumatic.  Eyes: Conjunctivae normal are normal.  Neck: Normal  range of motion. Neck supple.  Cardiovascular: Normal rate and regular rhythm.  Exam reveals no gallop and no friction rub.   No murmur heard. Pulmonary/Chest: Effort normal and breath sounds normal. He has no wheezes. He has no rales. He exhibits no tenderness.  Abdominal: Soft. He exhibits no distension.  Musculoskeletal: Normal range of motion.  Neurological: He is alert and oriented to person, place, and time. Coordination normal.       Speech is goal-oriented. Moves limbs without ataxia.   Skin: Skin is warm and dry. He is not diaphoretic.          Multiple scattered, small areas of erythema and overlying scabs of volar aspect of right forearm. No red streaking, mass, or area of fluctuance noted. Raised verrucous lesion located of occipital region of scalp -- no  tenderness or surrounding erythema.   Psychiatric: He has a normal mood and affect. His behavior is normal.    ED Course  Procedures (including critical care time)  Labs Reviewed - No data to display    1. Skin infection       MDM  9:27 AM Patient likely has a skin infection precipitated by recent tattoo. Patient reports clean needle use with tattoos and reports cleaning skin prior to and after tattoo. Patient will be treated with PO Keflex. Patient will be referred to Dermatology for follow up for a lesion on his scalp, likely wart or mole. No further evaluation needed.         Emilia Beck, [place] 09/02/12 [phone]

## 2012-09-02 NOTE — ED Provider Notes (Signed)
Medical screening examination/treatment/procedure(s) were conducted as a shared visit with non-physician practitioner(s) and myself.  I personally evaluated the patient during the encounter  This may represent early cellulitis.  Keflex now.  No history of abscesses in the past.  Given the fact that some of his issues it may have an allergic component as well.  Overall the patient is very well appearing.  Lyanne Co, MD 09/02/12 [phone]

## 2012-09-02 NOTE — ED Notes (Addendum)
Pt states that he got tattoos 4 days ago from a personal home not a pallor and now little rashes are within the tattoo on right arm. Has multiple new tattoos but only one has the rashes.

## 2012-09-03 ENCOUNTER — Other Ambulatory Visit: Payer: Self-pay | Admitting: Oncology

## 2012-09-04 NOTE — Telephone Encounter (Signed)
Message received from patient that the generic Norco refill has not been received by [facility].  Called [facility] at [address] And verbal refill order given at this time.

## 2012-09-05 ENCOUNTER — Telehealth: Payer: Self-pay | Admitting: *Deleted

## 2012-09-05 NOTE — Telephone Encounter (Signed)
This nurse called in refill for generic Norco last evening.  Called patient to question status of refill after various requests and responses.  Patient's thinks his pharmacy has not received refill order.  Explained that this nurse called [facility] at [address] and gave order to refill.  Patient did not pick up written script from provider so this script will be shredded.  Patient aware of f/u visit tomorrow at 4:30 pm.

## 2012-09-06 ENCOUNTER — Ambulatory Visit (HOSPITAL_BASED_OUTPATIENT_CLINIC_OR_DEPARTMENT_OTHER): Payer: Self-pay | Admitting: Oncology

## 2012-09-06 ENCOUNTER — Telehealth: Payer: Self-pay | Admitting: Oncology

## 2012-09-06 VITALS — BP 154/93 | HR 75 | Temp 97.1°F | Resp 20 | Ht 72.0 in | Wt 218.6 lb

## 2012-09-06 DIAGNOSIS — D474 Osteomyelofibrosis: Secondary | ICD-10-CM

## 2012-09-06 DIAGNOSIS — D471 Chronic myeloproliferative disease: Secondary | ICD-10-CM

## 2012-09-06 DIAGNOSIS — R161 Splenomegaly, not elsewhere classified: Secondary | ICD-10-CM

## 2012-09-06 NOTE — Patient Instructions (Signed)
Increase Jakofi to 25 mg twice daily Continue monthly lab checks Ultrasound and Dr visit after the new year

## 2012-09-06 NOTE — Telephone Encounter (Signed)
Gave pt appt for lab and Md for January 2014 ., Radiology will call for U/S Abdomen

## 2012-09-06 NOTE — Progress Notes (Signed)
Hematology and Oncology Follow Up Visit  Sean Walter [id number] [birth year] 41 y.o. 09/06/2012 5:43 PM   Principle Diagnosis: Encounter Diagnoses  Name Primary?  . Primary myelofibrosis Yes  . Spleen enlargement      Interim History:   Followup visit for this 41 year old man with a myeloproliferative disorder diagnosed in January 2012 when he presented with left upper quadrant abdominal discomfort with findings of splenomegaly. He was not anemic. He had mild leukocytosis. Normal platelet count. Serum LDH elevated at 904. He had a hypercellular bone marrow done 11/16/2010 with abnormal number and morphology of megakaryocytes. Based on some small areas of increased reticulin and collagen fibrosis he was called myelofibrosis. Reviewing previous blood counts prior to his diagnosis back as far as 2008 I noted that his platelet count was as high as 800,000. 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 Please see my summary note dated July 17 for additional details of his evaluation. Of note, cytogenetic studies on the bone marrow were normal.  He was referred to [facility] in consideration of a bone marrow transplant but does not have any suitable donors from his family. He was started on a trial of JAKAFI initially 20 mg twice daily beginning 12/09/2010. There have been fluctuations in his spleen size as assessed by abdominal ultrasounds with most recent study done in anticipation of today's visit on November 11 showing spleen size 22 x 8 x 20 compared with 23 x 18 x 7.4 cm on the study done September 9 and 23 by PT and by 8.6 cm on in a second study. There  was likely a typographical error and it is likely that the spleen size is actually 22 x 18 x 20. I think these values are all very similar despite  radiologist impression that the spleen is larger compared with the September study.  The patient has no new symptoms however he continues to use about 120 oxycodone with Tylenol  tablets for persistent left upper quadrant abdominal pain. Symptoms have not worsened. He has gone back to work and is able to do his usual activities. He has had no other interim medical problems and has no other complaints today.    Medications: reviewed  Allergies: No Known Allergies  Review of Systems: Constitutional:  Appetite good, weight stable  Respiratory: No cough or dyspnea Cardiovascular:  No chest pain or palpitations Gastrointestinal: See above Genito-Urinary: Not questioned Musculoskeletal: No muscle or bone pain Neurologic: No headache or change in vision Skin: No rash Remaining ROS negative.  Physical Exam: Blood pressure 154/93, pulse 75, temperature 97.1 F (36.2 C), temperature source Oral, resp. rate 20, height 6' (1.829 m), weight 218 lb 9.6 oz (99.156 kg). Wt Readings from Last 3 Encounters:  09/06/12 218 lb 9.6 oz (99.156 kg)  07/02/12 211 lb 11.2 oz (96.026 kg)  05/08/12 218 lb 1.6 oz (98.93 kg)     General appearance: [name] HENNT: Pharynx no erythema or exudate Lymph nodes: No adenopathy Breasts: Lungs: Clear to auscultation resonant to percussion Heart: Regular rhythm no murmur Abdomen: Spleen 4 fingers below left costal margin unchanged from a prior exam Extremities: No edema, no calf tenderness Vascular: No cyanosis Neurologic: Mental status intact, PERRLA, optic disc sharp, vessels normal, no hemorrhage or exudate, motor strength 5 over 5, reflexes absent symmetric at the knees 1+ symmetric at the biceps Skin: No rash or ecchymosis  Lab Results: Lab Results  Component Value Date   WBC 11.9* 09/02/2012   HGB  13.1 09/02/2012   HCT 41.2 09/02/2012   MCV 82.9 09/02/2012   PLT 122* 09/02/2012     Chemistry      Component Value Date/Time   NA 139 09/02/2012 0814   NA 138 07/29/2012 0637   K 3.7 09/02/2012 0814   K 3.9 07/29/2012 0637   CL 105 09/02/2012 0814   CL 102 07/29/2012 0637   CO2 28 09/02/2012 0814   CO2  28 07/29/2012 0637   BUN 8.0 09/02/2012 0814   BUN 10 07/29/2012 0637   CREATININE 1.4* 09/02/2012 0814   CREATININE 1.23 07/29/2012 0637      Component Value Date/Time   CALCIUM 9.3 09/02/2012 0814   CALCIUM 9.0 07/29/2012 0637   ALKPHOS 78 09/02/2012 0814   ALKPHOS 65 07/29/2012 0637   AST 20 09/02/2012 0814   AST 37 07/29/2012 0637   ALT 30 09/02/2012 0814   ALT 28 07/29/2012 0637   BILITOT 0.71 09/02/2012 0814   BILITOT 0.5 07/29/2012 4098       Radiological Studies: US Abdomen Limited  09/02/2012  *RADIOLOGY REPORT*  Clinical Data: Moderate fibrosis  ABDOMEN ULTRASOUND LIMITED  Technique: Sonographic imaging of the spleen was performed with volume measurements.  Comparison:  07/01/2012  Findings: The spleen measures 21.6 x 8.2 x 19.7 cm with a volume 1830 ml.  This is increased from 1547 ml on the prior study.  The hyperechoic lesion is stable with measurements of 1.8 x 1.8 x 1.5 cm.  IMPRESSION: Splenomegaly increased.  Stable hyperechoic lesion.   Original Report Authenticated By: Jolaine Click, M.D.     Impression and Plan: Agnogenic myeloid metaplasia with myelofibrosis  I'm going to increase his JAKOFI to 25 mg twice daily at this time. I will be attending the annual American Society of hematology meetings the first week in December. I will be able to get updated information about some of the second-generationJAK-2 inhibitors to see if any of these has more activity than the [place].  I'll see him again after the new year and repeat an ultrasound at that time. I hope that he is on a national marrow donor search list. This is really his only hope for a long-term remission and potential cure.   CC:. Dr. Verita Schneiders at [facility], MD 11/15/20135:43 PM

## 2012-09-09 ENCOUNTER — Encounter: Payer: Self-pay | Admitting: Oncology

## 2012-09-09 NOTE — Progress Notes (Signed)
Faxed new dose of Jakofi (25mg ) to [facility] @ [phone].  His eligibility ends 10/22/12; he will need to fill out a new application phone # [phone].

## 2012-09-10 ENCOUNTER — Other Ambulatory Visit: Payer: Self-pay | Admitting: *Deleted

## 2012-09-10 DIAGNOSIS — D474 Osteomyelofibrosis: Secondary | ICD-10-CM

## 2012-09-10 MED ORDER — RUXOLITINIB PHOSPHATE 25 MG PO TABS: 25.0000 mg | ORAL_TABLET | Freq: Two times a day (BID) | ORAL | Status: DC

## 2012-09-30 ENCOUNTER — Other Ambulatory Visit: Payer: Self-pay | Admitting: Oncology

## 2012-09-30 DIAGNOSIS — D474 Osteomyelofibrosis: Secondary | ICD-10-CM

## 2012-10-01 ENCOUNTER — Other Ambulatory Visit: Payer: Self-pay | Admitting: Oncology

## 2012-10-06 ENCOUNTER — Encounter (HOSPITAL_COMMUNITY): Payer: Self-pay | Admitting: Emergency Medicine

## 2012-10-06 ENCOUNTER — Emergency Department (HOSPITAL_COMMUNITY)
Admission: EM | Admit: 2012-10-06 | Discharge: 2012-10-06 | Disposition: A | Discharge status: Home / Self Care | Payer: Self-pay | Attending: Emergency Medicine | Admitting: Emergency Medicine

## 2012-10-06 DIAGNOSIS — F17200 Nicotine dependence, unspecified, uncomplicated: Secondary | ICD-10-CM | POA: Insufficient documentation

## 2012-10-06 DIAGNOSIS — Z9889 Other specified postprocedural states: Secondary | ICD-10-CM | POA: Insufficient documentation

## 2012-10-06 DIAGNOSIS — Z8719 Personal history of other diseases of the digestive system: Secondary | ICD-10-CM | POA: Insufficient documentation

## 2012-10-06 DIAGNOSIS — Z862 Personal history of diseases of the blood and blood-forming organs and certain disorders involving the immune mechanism: Secondary | ICD-10-CM | POA: Insufficient documentation

## 2012-10-06 DIAGNOSIS — J45909 Unspecified asthma, uncomplicated: Secondary | ICD-10-CM | POA: Insufficient documentation

## 2012-10-06 DIAGNOSIS — Z8619 Personal history of other infectious and parasitic diseases: Secondary | ICD-10-CM | POA: Insufficient documentation

## 2012-10-06 DIAGNOSIS — Z79899 Other long term (current) drug therapy: Secondary | ICD-10-CM | POA: Insufficient documentation

## 2012-10-06 DIAGNOSIS — S01511A Laceration without foreign body of lip, initial encounter: Secondary | ICD-10-CM

## 2012-10-06 DIAGNOSIS — S01501A Unspecified open wound of lip, initial encounter: Secondary | ICD-10-CM | POA: Insufficient documentation

## 2012-10-06 MED ORDER — NAPROXEN 500 MG PO TABS: 500.0000 mg | ORAL_TABLET | Freq: Two times a day (BID) | ORAL | Status: DC
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
## 2012-10-30 ENCOUNTER — Other Ambulatory Visit: Payer: Self-pay | Admitting: Oncology

## 2012-10-30 DIAGNOSIS — R52 Pain, unspecified: Secondary | ICD-10-CM

## 2012-10-30 NOTE — Telephone Encounter (Signed)
Pt called for refill on his hydrocodone.  Message left for pt that this was called to his pharmacy.

## 2012-11-04 ENCOUNTER — Telehealth: Payer: Self-pay | Admitting: *Deleted

## 2012-11-04 ENCOUNTER — Other Ambulatory Visit (HOSPITAL_BASED_OUTPATIENT_CLINIC_OR_DEPARTMENT_OTHER): Payer: No Typology Code available for payment source

## 2012-11-04 ENCOUNTER — Ambulatory Visit (HOSPITAL_COMMUNITY): Payer: No Typology Code available for payment source

## 2012-11-04 DIAGNOSIS — D471 Chronic myeloproliferative disease: Secondary | ICD-10-CM

## 2012-11-04 DIAGNOSIS — D474 Osteomyelofibrosis: Secondary | ICD-10-CM

## 2012-11-04 DIAGNOSIS — R161 Splenomegaly, not elsewhere classified: Secondary | ICD-10-CM

## 2012-11-04 LAB — COMPREHENSIVE METABOLIC PANEL (CC13)
ALT: 22 U/L (ref 0–55)
AST: 22 U/L (ref 5–34)
Albumin: 3.6 g/dL (ref 3.5–5.0)
Alkaline Phosphatase: 82 U/L (ref 40–150)
BUN: 11 mg/dL (ref 7.0–26.0)
CO2: 23 meq/L (ref 22–29)
Calcium: 9.1 mg/dL (ref 8.4–10.4)
Chloride: 107 meq/L (ref 98–107)
Creatinine: 1.3 mg/dL (ref 0.7–1.3)
Glucose: 95 mg/dL (ref 70–99)
Potassium: 4.1 meq/L (ref 3.5–5.1)
Sodium: 138 meq/L (ref 136–145)
Total Bilirubin: 0.62 mg/dL (ref 0.20–1.20)
Total Protein: 7.7 g/dL (ref 6.4–8.3)

## 2012-11-04 LAB — MORPHOLOGY: PLT EST: ADEQUATE

## 2012-11-04 LAB — CBC WITH DIFFERENTIAL/PLATELET
Basophils Absolute: 0.2 10*3/uL — ABNORMAL HIGH (ref 0.0–0.1)
EOS%: 0.8 % (ref 0.0–7.0)
Eosinophils Absolute: 0.1 10*3/uL (ref 0.0–0.5)
HCT: 43.8 % (ref 38.4–49.9)
HGB: 14.3 g/dL (ref 13.0–17.1)
MCH: 26.6 pg — ABNORMAL LOW (ref 27.2–33.4)
MCV: 81.2 fL (ref 79.3–98.0)
NEUT#: 15.6 10*3/uL — ABNORMAL HIGH (ref 1.5–6.5)
NEUT%: 88 % — ABNORMAL HIGH (ref 39.0–75.0)
lymph#: 1.1 10*3/uL (ref 0.9–3.3)

## 2012-11-04 LAB — LACTATE DEHYDROGENASE (CC13): LDH: 792 U/L — ABNORMAL HIGH (ref 125–245)

## 2012-11-04 NOTE — Telephone Encounter (Signed)
Pt called stating that he needs a letter for [employer] to go back to work with no restrictions.  He would like to start full time mon jan 20th.  He has an appt with Dr. Evie Lacks & could pick up then or before if possible.  Note to Dr Cyndie Chime.  He can be reached at [phone].

## 2012-11-06 ENCOUNTER — Telehealth: Payer: Self-pay | Admitting: *Deleted

## 2012-11-06 NOTE — Telephone Encounter (Signed)
Patient called re: letter to go back to work.  Dr. Cyndie Chime has written"  Mr. Sean Walter may resume his full work activities effective immediately."  Let patient know his letter is at the nurse's desk.  He can pick it up any time.  Patient states he may come after work or he may wait until Friday when he sees Dr. Reece Agar.

## 2012-11-08 ENCOUNTER — Ambulatory Visit (HOSPITAL_BASED_OUTPATIENT_CLINIC_OR_DEPARTMENT_OTHER): Payer: No Typology Code available for payment source | Admitting: Oncology

## 2012-11-08 ENCOUNTER — Telehealth: Payer: Self-pay | Admitting: Oncology

## 2012-11-08 VITALS — BP 153/79 | HR 84 | Temp 98.4°F | Resp 18 | Ht 72.0 in | Wt 212.8 lb

## 2012-11-08 DIAGNOSIS — D471 Chronic myeloproliferative disease: Secondary | ICD-10-CM

## 2012-11-08 DIAGNOSIS — D47Z9 Other specified neoplasms of uncertain behavior of lymphoid, hematopoietic and related tissue: Secondary | ICD-10-CM

## 2012-11-08 DIAGNOSIS — D474 Osteomyelofibrosis: Secondary | ICD-10-CM

## 2012-11-08 DIAGNOSIS — R161 Splenomegaly, not elsewhere classified: Secondary | ICD-10-CM

## 2012-11-08 NOTE — Telephone Encounter (Signed)
Gave pt appt for lab and MD on May 2014 , lab q 2 months

## 2012-11-10 ENCOUNTER — Encounter: Payer: Self-pay | Admitting: Oncology

## 2012-11-10 DIAGNOSIS — D47Z9 Other specified neoplasms of uncertain behavior of lymphoid, hematopoietic and related tissue: Secondary | ICD-10-CM

## 2012-11-10 DIAGNOSIS — C946 Myelodysplastic disease, not classified: Secondary | ICD-10-CM

## 2012-11-10 HISTORY — DX: Other specified neoplasms of uncertain behavior of lymphoid, hematopoietic and related tissue: D47.Z9

## 2012-11-10 HISTORY — DX: Myelodysplastic disease, not elsewhere classified: C94.6

## 2012-11-10 NOTE — Progress Notes (Signed)
Hematology and Oncology Follow Up Visit  Sean Walter [id number] [birth year] 41 y.o. 11/10/2012 10:53 AM   Principle Diagnosis: Encounter Diagnoses  Name Primary?  . Spleen enlargement Yes  . Primary myelofibrosis   . Unclassifiable myelodysplastic/myeloproliferative neoplasm      Interim History:   Followup visit for this 42 year old man who was initially called myelofibrosis but who is more likely a non-BCR-ABL myeloproliferative disorder. He presented with leukocytosis and massive splenomegaly in January 2012. Bone marrow biopsy done in 11/16/10 was hyper cellular with prominent proliferation of megakaryocytes many of which had abnormal morphology. Collagen and reticulin stains were focally positive. There were no excess blasts in fact, 500 cell count differential 0% blasts were recorded. Routine cytogenetics were normal but FISH probes were not done. Peripheral blood was negative for the presence of BCR-ABL transcripts. I searched again and I cannot find evidence that a check to analysis was ever done. The patient was started on JAKOFI by another hematologist. I have not followed him since July of 2013. Blood counts remained overall stable as well as his spleen size as assessed by ultrasound. He has become dependent on Vicodin to control left upper quadrant abdominal pain. I have strongly encouraged him to try to decrease the frequency and amount of pain medication. He has had no interim problems.  Medications: reviewed  Allergies: No Known Allergies  Review of Systems: Constitutional: no constitutional symptoms     Respiratory: no cough or dyspnea  Cardiovascular:   no chest pain or palpitations  Gastrointestinal: intermittent left upper quadrant pain mostly if he overexerts himself or is in the car for a long time  Genito-Urinary:  not questioned  Musculoskeletal: no muscle, bone, or joint pain  Neurologic: no headache or change in vision  Skin: no rash or ecchymosis    Remaining ROS negative.  Physical Exam: Blood pressure 153/79, pulse 84, temperature 98.4 F (36.9 C), temperature source Oral, resp. rate 18, height 6' (1.829 m), weight 212 lb 12.8 oz (96.525 kg). Wt Readings from Last 3 Encounters:  11/08/12 212 lb 12.8 oz (96.525 kg)  09/06/12 218 lb 9.6 oz (99.156 kg)  07/02/12 211 lb 11.2 oz (96.026 kg)     General appearance:  well-nourished African American man,  HENNT:  his head is shaved. Pharynx no erythema or exudate  Lymph nodes:  no lymphadenopathy  Breasts: Lungs: clear to auscultation resonant to percussion  Heart: regular rhythm no murmur  Abdomen: soft, nontender, spleen difficult to palpate but approximately 8 cm below left costal margin  Extremities: no edema, no calf tenderness  Vascular: no cyanosis  Neurologic: alert and oriented, PERRLA, optic discs sharp vessels normal, motor strength 5 over 5, reflexes 2+ symmetric  Skin: multiple tattoos, large wart posterior right neck   Lab Results: Lab Results  Component Value Date   WBC 17.8* 11/04/2012   HGB 14.3 11/04/2012   HCT 43.8 11/04/2012   MCV 81.2 11/04/2012   PLT 218 11/04/2012     Chemistry      Component Value Date/Time   NA 138 11/04/2012 1030   NA 138 07/29/2012 0637   K 4.1 11/04/2012 1030   K 3.9 07/29/2012 0637   CL 107 11/04/2012 1030   CL 102 07/29/2012 0637   CO2 23 11/04/2012 1030   CO2 28 07/29/2012 0637   BUN 11.0 11/04/2012 1030   BUN 10 07/29/2012 0637   CREATININE 1.3 11/04/2012 1030   CREATININE 1.23 07/29/2012 0637      Component Value  Date/Time   CALCIUM 9.1 11/04/2012 1030   CALCIUM 9.0 07/29/2012 0637   ALKPHOS 82 11/04/2012 1030   ALKPHOS 65 07/29/2012 0637   AST 22 11/04/2012 1030   AST 37 07/29/2012 0637   ALT 22 11/04/2012 1030   ALT 28 07/29/2012 0637   BILITOT 0.62 11/04/2012 1030   BILITOT 0.5 07/29/2012 0637       Impression and Plan: Non-BCR-ABL myelo proliferative disorder He remained stable on JAKOFI at current dose 25 mg twice a  day Plan: Continue lab and clinical followup. I will get an ultrasound prior to his next visit as ongoing evaluation of his spleen size. I think he is stable to go back to his full activities at work at this time. I gave him a letter to this effect.    CC:.    Levert Feinstein, MD 1/19/201410:53 AM

## 2012-11-29 ENCOUNTER — Other Ambulatory Visit: Payer: Self-pay | Admitting: Oncology

## 2012-12-06 ENCOUNTER — Ambulatory Visit (HOSPITAL_COMMUNITY): Payer: Self-pay

## 2012-12-16 ENCOUNTER — Telehealth: Payer: Self-pay | Admitting: *Deleted

## 2012-12-16 NOTE — Telephone Encounter (Signed)
Received call from pt requesting to "re-schedule appt with MD that he cancelled on 2/14 due to weather"  Informed pt that he did not have appt with MD on 12/06/12 with MD but was scheduled to have abd u/s on that day.  Pt was transferred to scheduler and confirmed appt for 01/03/13 at 9am for labs only.  Pt verbalized understanding of instructions.

## 2012-12-24 ENCOUNTER — Telehealth: Payer: Self-pay | Admitting: Oncology

## 2012-12-24 NOTE — Telephone Encounter (Signed)
Called pt and left message regarding appt for [facility] and May 2014

## 2012-12-26 ENCOUNTER — Other Ambulatory Visit: Payer: Self-pay | Admitting: Oncology

## 2012-12-26 DIAGNOSIS — D47Z9 Other specified neoplasms of uncertain behavior of lymphoid, hematopoietic and related tissue: Secondary | ICD-10-CM

## 2012-12-27 ENCOUNTER — Ambulatory Visit (HOSPITAL_COMMUNITY): Payer: Self-pay

## 2013-01-03 ENCOUNTER — Other Ambulatory Visit (HOSPITAL_BASED_OUTPATIENT_CLINIC_OR_DEPARTMENT_OTHER): Payer: Self-pay

## 2013-01-03 ENCOUNTER — Ambulatory Visit (HOSPITAL_COMMUNITY)
Admission: RE | Admit: 2013-01-03 | Discharge: 2013-01-03 | Disposition: A | Discharge status: Home / Self Care | Payer: Self-pay | Source: Ambulatory Visit | Attending: Oncology | Admitting: Oncology

## 2013-01-03 DIAGNOSIS — D471 Chronic myeloproliferative disease: Secondary | ICD-10-CM

## 2013-01-03 DIAGNOSIS — D739 Disease of spleen, unspecified: Secondary | ICD-10-CM | POA: Insufficient documentation

## 2013-01-03 DIAGNOSIS — R161 Splenomegaly, not elsewhere classified: Secondary | ICD-10-CM

## 2013-01-03 DIAGNOSIS — D474 Osteomyelofibrosis: Secondary | ICD-10-CM

## 2013-01-03 LAB — URIC ACID (CC13): Uric Acid, Serum: 5.9 mg/dl (ref 2.6–7.4)

## 2013-01-03 LAB — CBC WITH DIFFERENTIAL/PLATELET
Eosinophils Absolute: 0.1 10*3/uL (ref 0.0–0.5)
HCT: 35.9 % — ABNORMAL LOW (ref 38.4–49.9)
HGB: 11.6 g/dL — ABNORMAL LOW (ref 13.0–17.1)
LYMPH%: 10.4 % — ABNORMAL LOW (ref 14.0–49.0)
MONO#: 0.4 10*3/uL (ref 0.1–0.9)
NEUT#: 5.6 10*3/uL (ref 1.5–6.5)
NEUT%: 82.1 % — ABNORMAL HIGH (ref 39.0–75.0)
Platelets: 137 10*3/uL — ABNORMAL LOW (ref 140–400)
WBC: 6.8 10*3/uL (ref 4.0–10.3)
nRBC: 3 % — ABNORMAL HIGH (ref 0–0)

## 2013-01-03 LAB — MORPHOLOGY: PLT EST: DECREASED

## 2013-01-03 LAB — COMPREHENSIVE METABOLIC PANEL (CC13)
AST: 29 U/L (ref 5–34)
BUN: 10 mg/dL (ref 7.0–26.0)
Calcium: 9 mg/dL (ref 8.4–10.4)
Chloride: 104 mEq/L (ref 98–107)
Creatinine: 1.2 mg/dL (ref 0.7–1.3)

## 2013-02-21 ENCOUNTER — Other Ambulatory Visit: Payer: Self-pay | Admitting: *Deleted

## 2013-02-21 DIAGNOSIS — D47Z9 Other specified neoplasms of uncertain behavior of lymphoid, hematopoietic and related tissue: Secondary | ICD-10-CM

## 2013-02-21 MED ORDER — HYDROCODONE-ACETAMINOPHEN 5-325 MG PO TABS: ORAL_TABLET | ORAL | Status: DC

## 2013-03-07 ENCOUNTER — Encounter: Payer: Self-pay | Admitting: Oncology

## 2013-03-07 ENCOUNTER — Telehealth: Payer: Self-pay | Admitting: Oncology

## 2013-03-07 ENCOUNTER — Other Ambulatory Visit (HOSPITAL_BASED_OUTPATIENT_CLINIC_OR_DEPARTMENT_OTHER): Payer: Self-pay

## 2013-03-07 DIAGNOSIS — D471 Chronic myeloproliferative disease: Secondary | ICD-10-CM

## 2013-03-07 DIAGNOSIS — D474 Osteomyelofibrosis: Secondary | ICD-10-CM

## 2013-03-07 DIAGNOSIS — R161 Splenomegaly, not elsewhere classified: Secondary | ICD-10-CM

## 2013-03-07 DIAGNOSIS — D47Z9 Other specified neoplasms of uncertain behavior of lymphoid, hematopoietic and related tissue: Secondary | ICD-10-CM

## 2013-03-07 LAB — MANUAL DIFFERENTIAL
ALC: 0.8 10*3/uL — ABNORMAL LOW (ref 0.9–3.3)
ANC (CHCC manual diff): 6.6 10*3/uL — ABNORMAL HIGH (ref 1.5–6.5)
Basophil: 3 % — ABNORMAL HIGH (ref 0–2)
LYMPH: 10 % — ABNORMAL LOW (ref 14–49)
MONO: 3 % (ref 0–14)
Metamyelocytes: 4 % — ABNORMAL HIGH (ref 0–0)
Myelocytes: 4 % — ABNORMAL HIGH (ref 0–0)
Smudge Cells: 0
Variant Lymph: 0 % (ref 0–0)

## 2013-03-07 LAB — CBC WITH DIFFERENTIAL/PLATELET
MCH: 26.7 pg — ABNORMAL LOW (ref 27.2–33.4)
MCHC: 32.6 g/dL (ref 32.0–36.0)
Platelets: 102 10*3/uL — ABNORMAL LOW (ref 140–400)
RBC: 4.5 10*6/uL (ref 4.20–5.82)

## 2013-03-07 LAB — COMPREHENSIVE METABOLIC PANEL (CC13)
AST: 29 U/L (ref 5–34)
Albumin: 4 g/dL (ref 3.5–5.0)
BUN: 9.6 mg/dL (ref 7.0–26.0)
Calcium: 9.1 mg/dL (ref 8.4–10.4)
Chloride: 106 mEq/L (ref 98–107)
Glucose: 99 mg/dl (ref 70–99)
Potassium: 3.8 mEq/L (ref 3.5–5.1)
Sodium: 141 mEq/L (ref 136–145)
Total Protein: 7.9 g/dL (ref 6.4–8.3)

## 2013-03-07 NOTE — Telephone Encounter (Signed)
Gave pt appt calendar for MD vsiit on 5/20

## 2013-03-07 NOTE — Progress Notes (Signed)
Patient came in to advise that [employer] needed his tax forms faxed. I faxed to [phone]

## 2013-03-11 ENCOUNTER — Ambulatory Visit (HOSPITAL_BASED_OUTPATIENT_CLINIC_OR_DEPARTMENT_OTHER): Payer: Self-pay | Admitting: Oncology

## 2013-03-11 ENCOUNTER — Telehealth: Payer: Self-pay | Admitting: Oncology

## 2013-03-11 VITALS — BP 144/81 | HR 86 | Temp 98.1°F | Resp 18 | Ht 72.0 in | Wt 219.1 lb

## 2013-03-11 DIAGNOSIS — D47Z9 Other specified neoplasms of uncertain behavior of lymphoid, hematopoietic and related tissue: Secondary | ICD-10-CM

## 2013-03-11 DIAGNOSIS — D7581 Myelofibrosis: Secondary | ICD-10-CM

## 2013-03-11 DIAGNOSIS — R161 Splenomegaly, not elsewhere classified: Secondary | ICD-10-CM

## 2013-03-11 DIAGNOSIS — D471 Chronic myeloproliferative disease: Secondary | ICD-10-CM

## 2013-03-11 NOTE — Telephone Encounter (Signed)
Gave pt appt for lab and MD for July, August, september 2014

## 2013-03-13 NOTE — Progress Notes (Signed)
Hematology and Oncology Follow Up Visit  Sean Walter [id number] 05-Nov-1970 42 y.o. 03/13/2013 3:16 PM   Principle Diagnosis: Encounter Diagnoses  Name Primary?  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 Spleen enlargement   . Primary myelofibrosis      Interim History:   Followup visit for this 42 year old man who was initially called myelofibrosis but who is more likely a non-BCR-ABL myeloproliferative disorder. He presented with leukocytosis and massive splenomegaly in January 2012. Bone marrow biopsy done in 11/16/10 was hyper cellular with prominent proliferation of megakaryocytes many of which had abnormal morphology. Collagen and reticulin stains were focally positive. There were no excess blasts;  in fact, on a 500 cell count differential, 0% blasts were recorded. Routine cytogenetics were normal but FISH probes were not done. Peripheral blood was negative for the presence of BCR-ABL transcripts. I searched again and I could not  find evidence that a JAK-2 a  analysis was ever done.  I obtained a blood sample done at his last visit and in fact he does test positive for this gene mutation. (03/07/13) The patient was started on JAKOFI by another hematologist. I have now followed him since July of 2013. Blood counts remain overall stable as well as his spleen size as assessed by ultrasound. I do not know how reliable these measurements are but the most recent ultrasound does show a significant decrease in the spleen size down from 22 x 8 x 20 cm in November 2013  To 15 x 9 x 15 cm on 01/03/2013.   He has become dependent on Vicodin to control left upper quadrant abdominal pain.  He has had no interim problems. He is working part-time for [employer] and looking for another part-time job. His wife is also working. They have 5 sons. He has twin 63 year old boys who are living up in [place].  Medications: reviewed  Allergies: No Known Allergies  Review of  Systems: Constitutional:   No constitutional symptoms  Respiratory:No cough or dyspnea  Cardiovascular:  No chest pain or palpitations  Gastrointestinal Chronic left upper quadrant abdominal pain or change in bowel habit  Genito-Urinary: No urinary tract symptoms  Musculoskeletal:No muscle or bone pain  Neurologic:No headache or change in vision  Skin:No rash or ecchymosis  Remaining ROS negative.  Physical Exam: Blood pressure 144/81, pulse 86, temperature 98.1 F (36.7 C), temperature source Oral, resp. rate 18, height 6' (1.829 m), weight 219 lb 1.6 oz (99.383 kg). Wt Readings from Last 3 Encounters:  03/11/13 219 lb 1.6 oz (99.383 kg)  11/08/12 212 lb 12.8 oz (96.525 kg)  09/06/12 218 lb 9.6 oz (99.156 kg)     General appearance: Well-nourished African American man  HENNT: Pharynx no erythema or exudate  Lymph nodes: No adenopathy  Breasts: Lungs:Clear to auscultation resonant to percussion  Heart:Regular rhythm no murmur  Abdomen:Soft, nontender, difficult abdomen to examine, I was having trouble palpating his spleen. I believe it is stable at about 6 cm below left costal margin  Extremities:No edema, no calf tenderness  Musculoskeletal: GU: Vascular: Neurologic:Motor strength 5 over 5, reflexes 1+ symmetric, PERRLA, optic discs sharp vessels normal  Skin:No rash or ecchymosis. Multiple tattoos.  Lab Results: Lab Results  Component Value Date   WBC 8.0 03/07/2013   HGB 12.0* 03/07/2013   HCT 36.8* 03/07/2013   MCV 81.8 03/07/2013   PLT 102* 03/07/2013     Chemistry      Component Value Date/Time   NA 141 03/07/2013 0826  NA 138 07/29/2012 0637   K 3.8 03/07/2013 0826   K 3.9 07/29/2012 0637   CL 106 03/07/2013 0826   CL 102 07/29/2012 0637   CO2 25 03/07/2013 0826   CO2 28 07/29/2012 0637   BUN 9.6 03/07/2013 0826   BUN 10 07/29/2012 0637   CREATININE 1.3 03/07/2013 0826   CREATININE 1.23 07/29/2012 0637      Component Value Date/Time   CALCIUM 9.1 03/07/2013 0826    CALCIUM 9.0 07/29/2012 0637   ALKPHOS 57 03/07/2013 0826   ALKPHOS 65 07/29/2012 0637   AST 29 03/07/2013 0826   AST 37 07/29/2012 0637   ALT 34 03/07/2013 0826   ALT 28 07/29/2012 0637   BILITOT 0.69 03/07/2013 0826   BILITOT 0.5 07/29/2012 0981       Radiological Studies:See discussion above-decreased in size on most recent March 14 ultrasound    Impression: #1.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 Plan: Continue the same We had another discussion with respect to potential bone marrow transplant. He has been evaluated at [facility]. He has not heard anything about a potential match. I told him to keep reminding the people at [facility] that he is still active.    CC:.    Levert Feinstein, MD 5/22/20143:16 PM

## 2013-03-24 ENCOUNTER — Other Ambulatory Visit: Payer: Self-pay | Admitting: Oncology

## 2013-03-24 DIAGNOSIS — D474 Osteomyelofibrosis: Secondary | ICD-10-CM

## 2013-03-25 ENCOUNTER — Other Ambulatory Visit: Payer: Self-pay | Admitting: *Deleted

## 2013-04-10 ENCOUNTER — Telehealth: Payer: Self-pay | Admitting: *Deleted

## 2013-04-10 NOTE — Telephone Encounter (Signed)
Pt called requesting letter from MD stating pt is still "waiting for bone marrow transplant" to give to his employer.  Informed pt that MD is out of office today but will be back next week and will be given information.  Pt verbalized understanding.

## 2013-04-14 ENCOUNTER — Telehealth: Payer: Self-pay | Admitting: *Deleted

## 2013-04-14 NOTE — Telephone Encounter (Signed)
Patient calling again re: letter that he needs.  Will let Dr. Cyndie Chime know.

## 2013-04-15 ENCOUNTER — Telehealth: Payer: Self-pay | Admitting: *Deleted

## 2013-04-15 NOTE — Telephone Encounter (Signed)
Pt called this am regarding letter he requested.  Returned call & informed letter is ready to p/u & will be left at front desk.

## 2013-04-27 ENCOUNTER — Other Ambulatory Visit: Payer: Self-pay | Admitting: Oncology

## 2013-04-28 ENCOUNTER — Telehealth: Payer: Self-pay | Admitting: *Deleted

## 2013-04-28 DIAGNOSIS — D474 Osteomyelofibrosis: Secondary | ICD-10-CM

## 2013-04-28 MED ORDER — HYDROCODONE-ACETAMINOPHEN 5-325 MG PO TABS: ORAL_TABLET | ORAL | Status: DC

## 2013-04-28 NOTE — Telephone Encounter (Signed)
Received phone call from pt requesting refill on his pain med. Script called to pharmacy.

## 2013-05-01 ENCOUNTER — Telehealth: Payer: Self-pay | Admitting: *Deleted

## 2013-05-01 NOTE — Telephone Encounter (Signed)
sw pt informed him that Dr. Reece Agar will be on pal 06/10/13. gv appt d/t for 06/09/13@3  pm. i also made pt aware that i will mail a letter/avs as well...td

## 2013-05-02 ENCOUNTER — Other Ambulatory Visit (HOSPITAL_BASED_OUTPATIENT_CLINIC_OR_DEPARTMENT_OTHER): Payer: Self-pay

## 2013-05-02 DIAGNOSIS — R161 Splenomegaly, not elsewhere classified: Secondary | ICD-10-CM

## 2013-05-02 DIAGNOSIS — D471 Chronic myeloproliferative disease: Secondary | ICD-10-CM

## 2013-05-02 DIAGNOSIS — D474 Osteomyelofibrosis: Secondary | ICD-10-CM

## 2013-05-02 LAB — CBC WITH DIFFERENTIAL/PLATELET
BASO%: 1.8 % (ref 0.0–2.0)
EOS%: 1.4 % (ref 0.0–7.0)
Eosinophils Absolute: 0.1 10*3/uL (ref 0.0–0.5)
MCH: 26.4 pg — ABNORMAL LOW (ref 27.2–33.4)
MCHC: 32.4 g/dL (ref 32.0–36.0)
MCV: 81.5 fL (ref 79.3–98.0)
MONO%: 7.1 % (ref 0.0–14.0)
NEUT#: 4.2 10*3/uL (ref 1.5–6.5)
RBC: 4.16 10*6/uL — ABNORMAL LOW (ref 4.20–5.82)
RDW: 16.3 % — ABNORMAL HIGH (ref 11.0–14.6)
nRBC: 5 % — ABNORMAL HIGH (ref 0–0)

## 2013-05-02 LAB — MORPHOLOGY

## 2013-05-02 LAB — COMPREHENSIVE METABOLIC PANEL (CC13)
ALT: 57 U/L — ABNORMAL HIGH (ref 0–55)
AST: 42 U/L — ABNORMAL HIGH (ref 5–34)
Albumin: 3.9 g/dL (ref 3.5–5.0)
Alkaline Phosphatase: 79 U/L (ref 40–150)
Calcium: 9.5 mg/dL (ref 8.4–10.4)
Chloride: 106 mEq/L (ref 98–109)
Potassium: 3.5 mEq/L (ref 3.5–5.1)
Sodium: 140 mEq/L (ref 136–145)

## 2013-05-02 LAB — LACTATE DEHYDROGENASE (CC13): LDH: 625 U/L — ABNORMAL HIGH (ref 125–245)

## 2013-05-26 ENCOUNTER — Other Ambulatory Visit: Payer: Self-pay | Admitting: *Deleted

## 2013-05-26 ENCOUNTER — Other Ambulatory Visit: Payer: Self-pay | Admitting: Oncology

## 2013-05-26 DIAGNOSIS — D474 Osteomyelofibrosis: Secondary | ICD-10-CM

## 2013-05-26 MED ORDER — HYDROCODONE-ACETAMINOPHEN 5-325 MG PO TABS: ORAL_TABLET | ORAL | Status: DC

## 2013-05-26 NOTE — Telephone Encounter (Signed)
Pt notified pain med called in to [facility] per request.  Pt verbalized understanding.

## 2013-06-01 ENCOUNTER — Emergency Department (HOSPITAL_COMMUNITY): Payer: Self-pay

## 2013-06-01 ENCOUNTER — Emergency Department (HOSPITAL_COMMUNITY)
Admission: EM | Admit: 2013-06-01 | Discharge: 2013-06-01 | Disposition: A | Discharge status: Home / Self Care | Payer: Self-pay | Attending: Emergency Medicine | Admitting: Emergency Medicine

## 2013-06-01 ENCOUNTER — Encounter (HOSPITAL_COMMUNITY): Payer: Self-pay | Admitting: Physical Medicine and Rehabilitation

## 2013-06-01 DIAGNOSIS — J45909 Unspecified asthma, uncomplicated: Secondary | ICD-10-CM | POA: Insufficient documentation

## 2013-06-01 DIAGNOSIS — F17200 Nicotine dependence, unspecified, uncomplicated: Secondary | ICD-10-CM | POA: Insufficient documentation

## 2013-06-01 DIAGNOSIS — Z79899 Other long term (current) drug therapy: Secondary | ICD-10-CM | POA: Insufficient documentation

## 2013-06-01 DIAGNOSIS — R112 Nausea with vomiting, unspecified: Secondary | ICD-10-CM | POA: Insufficient documentation

## 2013-06-01 DIAGNOSIS — Z8719 Personal history of other diseases of the digestive system: Secondary | ICD-10-CM | POA: Insufficient documentation

## 2013-06-01 DIAGNOSIS — Z8619 Personal history of other infectious and parasitic diseases: Secondary | ICD-10-CM | POA: Insufficient documentation

## 2013-06-01 DIAGNOSIS — R519 Headache, unspecified: Secondary | ICD-10-CM

## 2013-06-01 DIAGNOSIS — Z856 Personal history of leukemia: Secondary | ICD-10-CM | POA: Insufficient documentation

## 2013-06-01 DIAGNOSIS — M549 Dorsalgia, unspecified: Secondary | ICD-10-CM | POA: Insufficient documentation

## 2013-06-01 DIAGNOSIS — R51 Headache: Secondary | ICD-10-CM | POA: Insufficient documentation

## 2013-06-01 DIAGNOSIS — R109 Unspecified abdominal pain: Secondary | ICD-10-CM

## 2013-06-01 LAB — CBC WITH DIFFERENTIAL/PLATELET
Basophils Absolute: 0.1 10*3/uL (ref 0.0–0.1)
Basophils Relative: 1 % (ref 0–1)
Eosinophils Absolute: 0.1 10*3/uL (ref 0.0–0.7)
Eosinophils Relative: 1 % (ref 0–5)
HCT: 37.9 % — ABNORMAL LOW (ref 39.0–52.0)
Lymphocytes Relative: 10 % — ABNORMAL LOW (ref 12–46)
MCHC: 33.5 g/dL (ref 30.0–36.0)
MCV: 81.7 fL (ref 78.0–100.0)
Monocytes Absolute: 0.4 10*3/uL (ref 0.1–1.0)
Platelets: 126 10*3/uL — ABNORMAL LOW (ref 150–400)
RDW: 16.3 % — ABNORMAL HIGH (ref 11.5–15.5)
WBC: 9.6 10*3/uL (ref 4.0–10.5)

## 2013-06-01 LAB — HEPATIC FUNCTION PANEL
AST: 25 U/L (ref 0–37)
Bilirubin, Direct: <0.1 mg/dL (ref 0.0–0.3)
Total Bilirubin: 0.7 mg/dL (ref 0.3–1.2)

## 2013-06-01 LAB — URINALYSIS, ROUTINE W REFLEX MICROSCOPIC
Bilirubin Urine: NEGATIVE
Hgb urine dipstick: NEGATIVE
Ketones, ur: NEGATIVE mg/dL
Nitrite: NEGATIVE
Protein, ur: NEGATIVE mg/dL
Urobilinogen, UA: 0.2 mg/dL (ref 0.0–1.0)

## 2013-06-01 LAB — POCT I-STAT, CHEM 8
Calcium, Ion: 1.18 mmol/L (ref 1.12–1.23)
Creatinine, Ser: 1.6 mg/dL — ABNORMAL HIGH (ref 0.50–1.35)
Glucose, Bld: 106 mg/dL — ABNORMAL HIGH (ref 70–99)
HCT: 41 % (ref 39.0–52.0)
Hemoglobin: 13.9 g/dL (ref 13.0–17.0)
Potassium: 3.8 mEq/L (ref 3.5–5.1)
TCO2: 25 mmol/L (ref 0–100)

## 2013-06-01 LAB — LIPASE, BLOOD: Lipase: 23 U/L (ref 11–59)

## 2013-06-01 MED ORDER — OXYCODONE-ACETAMINOPHEN 5-325 MG PO TABS: 1.0000 | ORAL_TABLET | Freq: Four times a day (QID) | ORAL | Status: DC | PRN

## 2013-06-01 MED ORDER — SODIUM CHLORIDE 0.9 % IV BOLUS (SEPSIS)
1000.0000 mL | Freq: Once | INTRAVENOUS | Status: AC
  Administered 2013-06-01: 1000 mL via INTRAVENOUS

## 2013-06-01 MED ORDER — HYDROMORPHONE HCL PF 1 MG/ML IJ SOLN
1.0000 mg | Freq: Once | INTRAMUSCULAR | Status: AC
  Administered 2013-06-01: 1 mg via INTRAVENOUS
  Filled 2013-06-01: qty 1

## 2013-06-01 MED ORDER — ONDANSETRON HCL 4 MG/2ML IJ SOLN
4.0000 mg | Freq: Once | INTRAMUSCULAR | Status: AC
  Administered 2013-06-01: 4 mg via INTRAVENOUS
  Filled 2013-06-01: qty 2

## 2013-06-01 MED ORDER — PROMETHAZINE HCL 25 MG PO TABS: 25.0000 mg | ORAL_TABLET | Freq: Four times a day (QID) | ORAL | Status: DC | PRN

## 2013-06-01 MED ORDER — IOHEXOL 300 MG/ML  SOLN
100.0000 mL | Freq: Once | INTRAMUSCULAR | Status: AC | PRN
  Administered 2013-06-01: 100 mL via INTRAVENOUS

## 2013-06-01 MED ORDER — IOHEXOL 300 MG/ML  SOLN
25.0000 mL | INTRAMUSCULAR | Status: DC | PRN
  Administered 2013-06-01: 25 mL via ORAL

## 2013-06-01 NOTE — ED Notes (Signed)
Pt resting quietly at the time. Unable to void. States he feels a little better. No signs of distress noted.

## 2013-06-01 NOTE — ED Notes (Signed)
Pt given a urinal to give a urine sample.

## 2013-06-01 NOTE — ED Provider Notes (Signed)
CSN: [id number]     Arrival date & time 06/01/13  0806 History     First MD Initiated Contact with Patient 06/01/13 0813     Chief Complaint  Patient presents with  . Headache  . 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 He states for the past 2-3 days he has developing pain to his abdomen which radiates around his back in a ring pattern. Describe pain as sharp and throbbing worsening when he bent over, with movement, or with walking and improves when he lies still. Pain associated with nausea and occasional nonbloody nonbilious vomit. Report having sharp throbbing headache for the same duration. Headache described as pressure to both eyes and tenderness in the forehead. No associated fever, chills, double vision, lightheadedness, dizziness, chest pain, shortness of breath, productive cough, hemoptysis, dysuria, hematuria, recent trauma, or rash. Patient has tried taking Excedrin, along with Vicodin at home with minimal improvement. He has never had a pain before.  No recent medication changes   Past Medical History  Diagnosis Date  . Asthma, cold induced   . Herpes   . Asthma   . No pertinent past medical history     enlarged spleen  . Spleen enlargement     myofibrosis need bonemarrow transplant  . Myelofibrosis     followed by Dr. Jethro Bolus  . 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 Mandible fracture surgery    . Mandibular hardware removal  04/10/2012    Procedure: MANDIBULAR HARDWARE REMOVAL;  Surgeon: Flo Shanks, MD;  Location: [facility] OR;  Service: ENT;  Laterality: Right;   History  reviewed. No pertinent family history. History  Substance Use Topics  . Smoking status: Current Some Day Smoker    Types: Cigarettes  . Smokeless tobacco: Not on file  . Alcohol Use: Yes     Comment: daily     Review of Systems  All other systems reviewed and are negative.    Allergies  Review of patient's allergies indicates no known allergies.  Home Medications   Current Outpatient Rx  Name  Route  Sig  Dispense  Refill  . Acetaminophen-Aspirin Buffered (EXCEDRIN BACK & BODY) 250-250 MG tablet   Oral   Take 2 tablets by mouth every 4 (four) hours as needed for fever.         [name] Kitchen acyclovir (ZOVIRAX) 400 MG tablet   Oral   Take 400 mg by mouth 3 (three) times daily as needed. OUTBREAK         . albuterol (PROVENTIL HFA;VENTOLIN HFA) 108 (90 BASE) MCG/ACT inhaler   Inhalation   Inhale 2 puffs into the lungs every 6 (six) hours as needed. WHEEZING          . HYDROcodone-acetaminophen (NORCO/VICODIN) 5-325 MG per tablet   Oral   Take 1 tablet by mouth every 8 (eight) hours as needed for pain.         [name] Kitchen PRESCRIPTION MEDICATION   Topical   Apply 1 application topically 3 (three) times daily. Cream for warts         . ruxolitinib phosphate (JAKAFI) 25 MG tablet   Oral   Take 1 tablet (25 mg total) by mouth 2 (two) times daily. This script was  given to [facility] 09/06/12 & she faxed to [facility] @ [phone] 11/181/3.  Pt notified that he should hear something from them soon & to call if he doesn't.   60 tablet   4    BP 151/112  Pulse 92  Temp(Src) 97.9 F (36.6 C) (Oral)  Resp 18  SpO2 98% Physical Exam  Nursing note and vitals reviewed. Constitutional: He is oriented to person, place, and time. He appears well-developed and well-nourished. No distress.  Awake, alert, nontoxic appearance  HENT:  Head: Atraumatic.  Mouth/Throat: Oropharynx is clear and moist.  Eyes: Conjunctivae and EOM are normal. Pupils are equal, round, and reactive to light. Right  eye exhibits no discharge. Left eye exhibits no discharge.  Neck: Normal range of motion. Neck supple.  No nuchal rigidity  Cardiovascular: Normal rate and regular rhythm.   Pulmonary/Chest: Effort normal. No respiratory distress. He exhibits no tenderness.  Abdominal: Soft. Bowel sounds are normal. He exhibits no distension. There is tenderness (Diffuse abdominal tenderness most significant to left upper quadrant, no guarding, no rebound tenderness, no LAD skin changes, no hernia.). There is no rebound and no guarding.  No abdominal pulsatile mass. No enlarged spleen on my exam.  Genitourinary:  CVA tenderness bilaterally on percussion  Musculoskeletal: He exhibits no edema and no tenderness.  ROM appears intact, no obvious focal weakness  No significant midline spine tenderness, crepitus, step off noted  Neurological: He is alert and oriented to person, place, and time. He has normal strength. No cranial nerve deficit or sensory deficit. GCS eye subscore is 4. GCS verbal subscore is 5. GCS motor subscore is 6.  Skin: Skin is warm and dry. No rash noted.  Psychiatric: He has a normal mood and affect.    ED Course   Procedures (including critical care time)  8:52 AM Patient with history of splenic enlargement, presents complaining of abdominal pain headache. No red flags. No acute onset thunderclap headache concerning for subarachnoid hemorrhage, no fever or nuchal rigidity concerning for meningitis, no focal neuro deficits concerning for stroke. His abdomen is moderately tender on exam most significant to LUQ, we'll consider CT scan for evaluation. Pain medication given. Patient otherwise afebrile with stable normal vital sign.   9:51 AM Pt has elevated Cr at 1.60.  IVF bolus was given prior to CT scan.  CT scan shows no acute finding.  Pt does mentioned consuming 40z beer on a daily basis.  Alcohol cessation discussed. Abdominal ultrasound shows no evidence of gallbladder etiology or any  acute finding. Normal hepatic function panel. Urine is unremarkable. Patient currently back to his normal baseline and pain has improved. He felt stable to be discharged. We'll give resources for followup. Recommend followup with Dr. Cyndie Chime as well.  Return precautions discussed.  Labs Reviewed  CBC WITH DIFFERENTIAL - Abnormal; Notable for the following:    Hemoglobin 12.7 (*)    HCT 37.9 (*)    RDW 16.3 (*)    Platelets 126 (*)    Neutrophils Relative % 84 (*)    Neutro Abs 8.0 (*)    Lymphocytes Relative 10 (*)    All other components within normal limits  POCT I-STAT, CHEM 8 - Abnormal; Notable for the following:    Creatinine, Ser 1.60 (*)    Glucose, Bld 106 (*)    All other components within normal limits  LIPASE, BLOOD  URINALYSIS, ROUTINE W REFLEX MICROSCOPIC  HEPATIC FUNCTION PANEL   US Abdomen Complete  06/01/2013   *  RADIOLOGY REPORT*  Clinical Data:  Epigastric pain  COMPLETE ABDOMINAL ULTRASOUND  Comparison:  CT from earlier the same day  Findings:  Gallbladder:  No shadowing gallstones or echogenic sludge.  No gallbladder wall thickening or pericholecystic fluid.  Negative sonographic Murphy's sign according to the ultrasound technologist.  Common bile duct:  4.6 mm diameter, unremarkable.  Liver:   At least 20 cm in length. No focal mass lesion seen. Within normal limits in parenchymal echogenicity.  IVC:  Appears normal.  Pancreas:  Unremarkable, segments obscured by overlying bowel gas.  Spleen:   Normal in echotexture, enlarged, 5 x 6.8 x 20.6 cm (367 cc).  Right Kidney:  10cm. No hydronephrosis.  Well-preserved cortex. Normal size and parenchymal echotexture without focal abnormalities.  Left Kidney:  13 cm. No hydronephrosis.  Well-preserved cortex. Normal size and parenchymal echotexture without focal abnormalities.  Abdominal aorta:  No aneurysm identified.  IMPRESSION:  1.  Normal gallbladder. 2. Hepatosplenomegaly as previously noted without focal lesion.   Original  Report Authenticated By: D. Andria Rhein, MD   Ct Abdomen Pelvis W Contrast  06/01/2013   *RADIOLOGY REPORT*  Clinical Data: History of splenomegaly and myelofibrosis.  Back pain.  CT ABDOMEN AND PELVIS WITH CONTRAST  Technique:  Multidetector CT imaging of the abdomen and pelvis was performed following the standard protocol during bolus administration of intravenous contrast.  Contrast: OMNIPAQUE IOHEXOL 300 MG/ML  SOLN  Comparison: 01/03/2013 abdominal ultrasound.  Most recent CT of 11/07/2010.  Findings: Lung bases:  Mild motion degradation at the lung bases. Normal heart size without pericardial or pleural effusion.  Abdomen/pelvis:  Hepatomegaly, with the liver measuring greater than 19 cm cranial caudal. Focal steatosis adjacent the falciform ligament.  Splenomegaly, with the spleen measuring 17.6 cm cranial caudal and 14.5 x 8.9 cm transverse.  This is improved since 11/07/2010 and similar to slightly increased since 01/03/2013 (given cross modality comparison).  Vague hypoattenuation within the spleen is similar to 2012 posteriorly at 1.8 cm.  This is nonspecific.  Normal stomach, pancreas, gallbladder, biliary tract, adrenal glands, kidneys.  No retroperitoneal or retrocrural adenopathy.  Normal colon, appendix, and terminal ileum.  Normal small bowel without abdominal ascites.    No pelvic adenopathy.    Normal urinary bladder and prostate.  No significant free fluid.  Bones/Musculoskeletal:  No acute osseous abnormality.  IMPRESSION: 1.  Chronic hepatosplenomegaly. 2.  No other explanation for back pain.   Original Report Authenticated By: Jeronimo Greaves, M.D.   1. Headache   2. Abdominal pain     MDM  BP 135/88  Pulse 87  Temp(Src) 97.9 F (36.6 C) (Oral)  Resp 18  SpO2 99%  I have reviewed nursing notes and vital signs. I personally reviewed the imaging tests through PACS system  I reviewed available ER/hospitalization records thought the EMR   Fayrene Helper, [place] 06/01/13 1304

## 2013-06-01 NOTE — ED Provider Notes (Signed)
Medical screening examination/treatment/procedure(s) were performed by non-physician practitioner and as supervising physician I was immediately available for consultation/collaboration.   Junius Argyle, MD 06/01/13 [phone]

## 2013-06-01 NOTE — ED Notes (Addendum)
Pt presents to department for evaluation of headache and back pain. Ongoing x2 days. 9/10 pain at the time. Also states pressure to both eyes and nausea. No neurological deficits noted. Pt is alert and oriented x4. Pt also states abdominal pain radiating to back. Denies diarrhea. Last BM yesterday was normal.

## 2013-06-03 ENCOUNTER — Other Ambulatory Visit: Payer: Self-pay

## 2013-06-03 ENCOUNTER — Ambulatory Visit (HOSPITAL_COMMUNITY): Payer: Self-pay

## 2013-06-09 ENCOUNTER — Ambulatory Visit: Payer: Self-pay | Admitting: Oncology

## 2013-06-10 ENCOUNTER — Ambulatory Visit: Payer: Self-pay | Admitting: Oncology

## 2013-06-10 ENCOUNTER — Encounter: Payer: Self-pay | Admitting: Oncology

## 2013-06-10 NOTE — Progress Notes (Signed)
Young man with a myeloproliferative disorder with early myelofibrosis on chronic treatment with JAKOFI. He failed to keep his appointment today. He will be rescheduled.

## 2013-06-18 ENCOUNTER — Telehealth: Payer: Self-pay | Admitting: Oncology

## 2013-06-18 NOTE — Telephone Encounter (Signed)
Pt called and left message, called pt back left message, pt FTKA on 8/18

## 2013-06-20 ENCOUNTER — Other Ambulatory Visit: Payer: Self-pay | Admitting: Oncology

## 2013-06-20 DIAGNOSIS — D47Z9 Other specified neoplasms of uncertain behavior of lymphoid, hematopoietic and related tissue: Secondary | ICD-10-CM

## 2013-06-20 DIAGNOSIS — D471 Chronic myeloproliferative disease: Secondary | ICD-10-CM

## 2013-06-20 DIAGNOSIS — R161 Splenomegaly, not elsewhere classified: Secondary | ICD-10-CM

## 2013-06-24 ENCOUNTER — Telehealth: Payer: Self-pay | Admitting: Oncology

## 2013-06-24 NOTE — Telephone Encounter (Signed)
Called pt and left message regarding labs and MD on September 2014, per Dr. Reece Agar

## 2013-06-25 ENCOUNTER — Other Ambulatory Visit: Payer: Self-pay | Admitting: Oncology

## 2013-06-25 ENCOUNTER — Telehealth: Payer: Self-pay | Admitting: Oncology

## 2013-06-25 ENCOUNTER — Ambulatory Visit (HOSPITAL_COMMUNITY)
Admission: RE | Admit: 2013-06-25 | Discharge: 2013-06-25 | Disposition: A | Discharge status: Home / Self Care | Payer: BC Managed Care – PPO | Source: Ambulatory Visit | Attending: Oncology | Admitting: Oncology

## 2013-06-25 ENCOUNTER — Other Ambulatory Visit: Payer: Self-pay | Admitting: *Deleted

## 2013-06-25 DIAGNOSIS — D7581 Myelofibrosis: Secondary | ICD-10-CM | POA: Insufficient documentation

## 2013-06-25 DIAGNOSIS — R161 Splenomegaly, not elsewhere classified: Secondary | ICD-10-CM

## 2013-06-25 DIAGNOSIS — R1013 Epigastric pain: Secondary | ICD-10-CM | POA: Insufficient documentation

## 2013-06-25 DIAGNOSIS — D47Z9 Other specified neoplasms of uncertain behavior of lymphoid, hematopoietic and related tissue: Secondary | ICD-10-CM

## 2013-06-25 DIAGNOSIS — D471 Chronic myeloproliferative disease: Secondary | ICD-10-CM

## 2013-06-25 MED ORDER — HYDROCODONE-ACETAMINOPHEN 5-325 MG PO TABS: 1.0000 | ORAL_TABLET | Freq: Three times a day (TID) | ORAL | Status: DC | PRN

## 2013-06-25 NOTE — Telephone Encounter (Signed)
Pt came by and r./s appt to 9/12

## 2013-06-25 NOTE — Telephone Encounter (Signed)
Pt called for refill on his hydrocodone.  He was seen in the ED & missed his last appt for this reason but has rescheduled.  Pt to p/u script tomorrow.

## 2013-06-27 ENCOUNTER — Telehealth: Payer: Self-pay | Admitting: *Deleted

## 2013-06-27 ENCOUNTER — Other Ambulatory Visit (HOSPITAL_BASED_OUTPATIENT_CLINIC_OR_DEPARTMENT_OTHER): Payer: Self-pay

## 2013-06-27 DIAGNOSIS — D7581 Myelofibrosis: Secondary | ICD-10-CM

## 2013-06-27 DIAGNOSIS — D47Z9 Other specified neoplasms of uncertain behavior of lymphoid, hematopoietic and related tissue: Secondary | ICD-10-CM

## 2013-06-27 DIAGNOSIS — D471 Chronic myeloproliferative disease: Secondary | ICD-10-CM

## 2013-06-27 DIAGNOSIS — R161 Splenomegaly, not elsewhere classified: Secondary | ICD-10-CM

## 2013-06-27 LAB — CBC WITH DIFFERENTIAL/PLATELET
EOS%: 0.8 % (ref 0.0–7.0)
Eosinophils Absolute: 0.1 10*3/uL (ref 0.0–0.5)
MCH: 27.2 pg (ref 27.2–33.4)
MCV: 81 fL (ref 79.3–98.0)
MONO%: 4 % (ref 0.0–14.0)
NEUT#: 7.9 10*3/uL — ABNORMAL HIGH (ref 1.5–6.5)
RBC: 4.6 10*6/uL (ref 4.20–5.82)
RDW: 17.2 % — ABNORMAL HIGH (ref 11.0–14.6)
lymph#: 1.2 10*3/uL (ref 0.9–3.3)

## 2013-06-27 LAB — URIC ACID (CC13): Uric Acid, Serum: 7.1 mg/dl (ref 2.6–7.4)

## 2013-06-27 LAB — MORPHOLOGY

## 2013-06-27 LAB — COMPREHENSIVE METABOLIC PANEL (CC13)
Albumin: 4.1 g/dL (ref 3.5–5.0)
CO2: 22 mEq/L (ref 22–29)
Calcium: 9.3 mg/dL (ref 8.4–10.4)
Glucose: 107 mg/dl (ref 70–140)
Potassium: 3.8 mEq/L (ref 3.5–5.1)
Sodium: 141 mEq/L (ref 136–145)
Total Protein: 8.1 g/dL (ref 6.4–8.3)

## 2013-06-27 NOTE — Telephone Encounter (Signed)
Spoke with patient.  Let him know his lab is stable and to stay on the same dose of Jakafi.  He appreciated the phone call.

## 2013-06-27 NOTE — Telephone Encounter (Signed)
Message copied by Orbie Hurst on Fri Jun 27, 2013  5:06 PM ------      Message from: Levert Feinstein      Created: Fri Jun 27, 2013  4:43 PM       Call lab stable  Stay on same dose Sean Walter ------

## 2013-06-27 NOTE — Telephone Encounter (Signed)
Patient called requesting that we call in his hydrocodone-acetominophen to [facility].  Retrieved written script from Injection Room.  Called in to [facility] [address] [phone].  Written script destroyed/recyle box.

## 2013-07-02 ENCOUNTER — Telehealth: Payer: Self-pay | Admitting: *Deleted

## 2013-07-02 NOTE — Telephone Encounter (Signed)
Left message for patient that his spleen size is stable.

## 2013-07-02 NOTE — Telephone Encounter (Signed)
Message copied by Orbie Hurst on Wed Jul 02, 2013  4:56 PM ------      Message from: Levert Feinstein      Created: Thu Jun 26, 2013  4:22 PM       Call pt spleen size stable ------

## 2013-07-04 ENCOUNTER — Telehealth: Payer: Self-pay | Admitting: Oncology

## 2013-07-04 ENCOUNTER — Other Ambulatory Visit: Payer: Self-pay

## 2013-07-04 ENCOUNTER — Ambulatory Visit (HOSPITAL_BASED_OUTPATIENT_CLINIC_OR_DEPARTMENT_OTHER): Payer: Self-pay | Admitting: Oncology

## 2013-07-04 VITALS — BP 141/83 | HR 80 | Temp 97.1°F | Resp 20 | Ht 72.0 in | Wt 217.4 lb

## 2013-07-04 DIAGNOSIS — D471 Chronic myeloproliferative disease: Secondary | ICD-10-CM

## 2013-07-04 DIAGNOSIS — D474 Osteomyelofibrosis: Secondary | ICD-10-CM

## 2013-07-04 DIAGNOSIS — R161 Splenomegaly, not elsewhere classified: Secondary | ICD-10-CM

## 2013-07-04 NOTE — Telephone Encounter (Signed)
Gave pt appt for labs every month and see December 2014

## 2013-07-05 NOTE — Progress Notes (Signed)
Hematology and Oncology Follow Up Visit  Sean Walter [id number] [birth year] 42 y.o. 07/05/2013 5:05 PM   Principle Diagnosis: Encounter Diagnoses  Name Primary?  . Primary myelofibrosis Yes  . Spleen enlargement      Interim History:   Followup visit for this 42 year old man who was initially called myelofibrosis but who is more likely a non-BCR-ABL myeloproliferative disorder. He presented with leukocytosis and massive splenomegaly in January 2012. Bone marrow biopsy done in 11/16/10 was hyper cellular with prominent proliferation of megakaryocytes many of which had abnormal morphology. Collagen and reticulin stains were focally positive. There were no excess blasts; in fact, on a 500 cell count differential, 0% blasts were recorded. Routine cytogenetics were normal but FISH probes were not done. Peripheral blood was negative for the presence of BCR-ABL transcripts. I searched again and I could not find evidence that a JAK-2 a analysis was ever done.  I obtained a blood sample done at his last visit and in fact he does test positive for this gene mutation. (03/07/13)  The patient was started on JAKOFI by another hematologist. Current dose is 25 mg twice daily.  I have now followed him since July of 2013. Blood counts remain overall stable as well as his spleen size as assessed by ultrasound. Most recent study done in anticipation of today's visit on 06/25/2013 reports the spleen volume and not the spleen size. This is reported as being stable compared with the prior studies done in March and August of this year. He continues to have intermittent left upper quadrant abdominal pain particularly if he exercises or works too much. He works at [employer] but is trying not to lift heavy items. He has become dependent on Percocet but does not overuse it. He has had no interim medical problems. No infections.    Medications: reviewed  Allergies: No Known Allergies  Review of Systems: Constitutional:    No constitutional symptoms HEENT no sore throat Respiratory: No cough or dyspnea Cardiovascular:  No chest pain or palpitations Gastrointestinal: No abdominal pain no change in bowel habit Genito-Urinary: No urinary tract symptoms Musculoskeletal: Intermittent left upper quadrant pain is noted Neurologic: No headache or change in vision Skin: Has had some problems with the skin in his inguinal region bilaterally with what sounds like small abscess formation. Skin becomes tender and then forms a knot. He has been using local antibiotic cream with good results.  Remaining ROS negative.    Physical Exam: Blood pressure 141/83, pulse 80, temperature 97.1 F (36.2 C), temperature source Oral, resp. rate 20, height 6' (1.829 m), weight 217 lb 6.4 oz (98.612 kg). Wt Readings from Last 3 Encounters:  07/04/13 217 lb 6.4 oz (98.612 kg)  03/11/13 219 lb 1.6 oz (99.383 kg)  11/08/12 212 lb 12.8 oz (96.525 kg)     General appearance: Muscular, healthy-appearing, African American man HENNT: Pharynx no erythema, exudate, or ulcer, no thyromegaly, Lymph nodes: No cervical, supraclavicular, or axillary adenopathy Breasts: Lungs: Clear to auscultation resonant to percussion Heart: Regular rhythm, no murmur Abdomen: Soft, nontender, muscular abdomen, difficult to examine, estimated spleen size 5 cm below left costal margin unchanged from prior exams Extremities: No edema, no calf tenderness Musculoskeletal: No joint deformities GU: Inspection and palpation in the inguinal area reveals no active abscess formation or rash at this time Vascular: No carotid bruits, no cyanosis Neurologic: Mental status intact, PERRLA, optic disc sharp vessels normal, motor strength 5 over 5, reflexes 2+ symmetric Skin: No rash or ecchymosis  Lab  Results: Lab Results  Component Value Date   WBC 9.6 06/27/2013   HGB 12.5* 06/27/2013   HCT 37.2* 06/27/2013   MCV 81.0 06/27/2013   PLT 132* 06/27/2013   white count  differential:   Chemistry      Component Value Date/Time   NA 141 06/27/2013 0908   NA 137 06/01/2013 0907   K 3.8 06/27/2013 0908   K 3.8 06/01/2013 0907   CL 103 06/01/2013 0907   CL 106 03/07/2013 0826   CO2 22 06/27/2013 0908   CO2 28 07/29/2012 0637   BUN 11.8 06/27/2013 0908   BUN 8 06/01/2013 0907   CREATININE 1.3 06/27/2013 0908   CREATININE 1.60* 06/01/2013 0907      Component Value Date/Time   CALCIUM 9.3 06/27/2013 0908   CALCIUM 9.0 07/29/2012 0637   ALKPHOS 49 06/27/2013 0908   ALKPHOS 50 06/01/2013 0854   AST 25 06/27/2013 0908   AST 25 06/01/2013 0854   ALT 32 06/27/2013 0908   ALT 29 06/01/2013 0854   BILITOT 0.52 06/27/2013 0908   BILITOT 0.7 06/01/2013 0854       Radiological Studies: US Abdomen Complete  06/25/2013   CLINICAL DATA:  42 year old male with myelofibrosis, splenomegaly. History epigastric pain.  EXAM: ABDOMEN ULTRASOUND  COMPARISON:  06/01/2013. CT Abdomen and Pelvis 06/01/2013.  FINDINGS: Gallbladder  No gallstones or wall thickening. Negative sonographic Murphy's sign.  Common bile duct  Diameter: 4 mm, normal.  Liver  Stable generalized increased echotexture. No discrete liver lesion. Stable size, mildly enlarged.  IVC  No abnormality visualized.  Pancreas  Visualized portion unremarkable.  Spleen  Enlarged, estimated volume 618 mL (Normal splenic volume range 83 - 412 mL). Small focal increased area of echogenicity at the lower pole appears to correspond to the area of mild heterogeneity on the recent CT. This is small measuring up to 17 mm and indeterminate, might represent a benign hemangioma.  Right Kidney  Length: 10.3 cm Echogenicity within normal limits. No mass or hydronephrosis visualized.  Left Kidney  Length: 10.9 cm Echogenicity within normal limits. No mass or hydronephrosis visualized.  Abdominal aorta  No aneurysm visualized.  IMPRESSION: 1. Stable appearing hepatis splenomegaly. Stable small indeterminate echogenic lesion at the lower pole of the spleen.  2.  Otherwise normal abdominal ultrasound.   Electronically Signed   By: Augusto Gamble   On: 06/25/2013 09:10    Impression:  JAK-2 positive, BCR Abl, myeloproliferative disorder with focal fibrosis  Stable splenomegaly and blood counts on JAKOFI Plan: Continue the same I have encouraged him to keep the lines of communication open with [facility]. He is young. The only curative therapy we have is allogeneic bone marrow transplant. He is working out the logistics with his family so that he has a caregiver if he has to undergo a transplant.     CC:.    Levert Feinstein, MD 9/13/20145:05 PM

## 2013-07-15 ENCOUNTER — Ambulatory Visit: Payer: Self-pay | Admitting: Oncology

## 2013-07-23 ENCOUNTER — Other Ambulatory Visit: Payer: Self-pay | Admitting: Nurse Practitioner

## 2013-07-23 DIAGNOSIS — R161 Splenomegaly, not elsewhere classified: Secondary | ICD-10-CM

## 2013-07-23 DIAGNOSIS — D474 Osteomyelofibrosis: Secondary | ICD-10-CM

## 2013-08-04 ENCOUNTER — Other Ambulatory Visit (HOSPITAL_BASED_OUTPATIENT_CLINIC_OR_DEPARTMENT_OTHER): Payer: Self-pay | Admitting: Lab

## 2013-08-04 DIAGNOSIS — D471 Chronic myeloproliferative disease: Secondary | ICD-10-CM

## 2013-08-04 DIAGNOSIS — D474 Osteomyelofibrosis: Secondary | ICD-10-CM

## 2013-08-04 DIAGNOSIS — R161 Splenomegaly, not elsewhere classified: Secondary | ICD-10-CM

## 2013-08-04 LAB — CBC WITH DIFFERENTIAL/PLATELET
BASO%: 0.7 % (ref 0.0–2.0)
LYMPH%: 12 % — ABNORMAL LOW (ref 14.0–49.0)
MCHC: 32.4 g/dL (ref 32.0–36.0)
MONO#: 0.7 10*3/uL (ref 0.1–0.9)
RBC: 5.17 10*6/uL (ref 4.20–5.82)
RDW: 17.2 % — ABNORMAL HIGH (ref 11.0–14.6)
WBC: 15.3 10*3/uL — ABNORMAL HIGH (ref 4.0–10.3)
lymph#: 1.8 10*3/uL (ref 0.9–3.3)

## 2013-08-04 LAB — COMPREHENSIVE METABOLIC PANEL (CC13)
Anion Gap: 11 mEq/L (ref 3–11)
CO2: 24 mEq/L (ref 22–29)
Glucose: 117 mg/dl (ref 70–140)
Sodium: 136 mEq/L (ref 136–145)
Total Bilirubin: 0.67 mg/dL (ref 0.20–1.20)
Total Protein: 8.3 g/dL (ref 6.4–8.3)

## 2013-08-04 LAB — MORPHOLOGY

## 2013-08-04 LAB — URIC ACID (CC13): Uric Acid, Serum: 8.6 mg/dl — ABNORMAL HIGH (ref 2.6–7.4)

## 2013-08-21 ENCOUNTER — Other Ambulatory Visit: Payer: Self-pay | Admitting: *Deleted

## 2013-08-21 ENCOUNTER — Telehealth: Payer: Self-pay | Admitting: *Deleted

## 2013-08-21 DIAGNOSIS — D474 Osteomyelofibrosis: Secondary | ICD-10-CM

## 2013-08-21 DIAGNOSIS — R161 Splenomegaly, not elsewhere classified: Secondary | ICD-10-CM

## 2013-08-21 MED ORDER — HYDROCODONE-ACETAMINOPHEN 5-325 MG PO TABS: ORAL_TABLET | ORAL | Status: DC

## 2013-08-21 NOTE — Telephone Encounter (Signed)
Received call from pt requesting refill on his hydrocodone.  Informed that script would be ready soon.

## 2013-09-01 ENCOUNTER — Other Ambulatory Visit (HOSPITAL_BASED_OUTPATIENT_CLINIC_OR_DEPARTMENT_OTHER): Payer: Self-pay

## 2013-09-01 ENCOUNTER — Encounter (INDEPENDENT_AMBULATORY_CARE_PROVIDER_SITE_OTHER): Payer: Self-pay

## 2013-09-01 DIAGNOSIS — D474 Osteomyelofibrosis: Secondary | ICD-10-CM

## 2013-09-01 DIAGNOSIS — D471 Chronic myeloproliferative disease: Secondary | ICD-10-CM

## 2013-09-01 DIAGNOSIS — R161 Splenomegaly, not elsewhere classified: Secondary | ICD-10-CM

## 2013-09-01 LAB — MORPHOLOGY

## 2013-09-01 LAB — COMPREHENSIVE METABOLIC PANEL (CC13)
Albumin: 4 g/dL (ref 3.5–5.0)
BUN: 10.4 mg/dL (ref 7.0–26.0)
Calcium: 10 mg/dL (ref 8.4–10.4)
Chloride: 105 mEq/L (ref 98–109)
Glucose: 94 mg/dl (ref 70–140)
Potassium: 3.7 mEq/L (ref 3.5–5.1)

## 2013-09-01 LAB — CBC WITH DIFFERENTIAL/PLATELET
BASO%: 1.4 % (ref 0.0–2.0)
Basophils Absolute: 0.1 10*3/uL (ref 0.0–0.1)
EOS%: 1.3 % (ref 0.0–7.0)
HGB: 11.8 g/dL — ABNORMAL LOW (ref 13.0–17.1)
MCH: 26.8 pg — ABNORMAL LOW (ref 27.2–33.4)
MCHC: 32.8 g/dL (ref 32.0–36.0)
RBC: 4.4 10*6/uL (ref 4.20–5.82)
RDW: 18.3 % — ABNORMAL HIGH (ref 11.0–14.6)
lymph#: 0.8 10*3/uL — ABNORMAL LOW (ref 0.9–3.3)

## 2013-09-05 ENCOUNTER — Telehealth: Payer: Self-pay | Admitting: *Deleted

## 2013-09-05 NOTE — Telephone Encounter (Signed)
Notified pt of good lab results per Dr. Cyndie Chime.

## 2013-09-05 NOTE — Telephone Encounter (Signed)
Message copied by Sabino Snipes on Fri Sep 05, 2013 12:43 PM ------      Message from: Levert Feinstein      Created: Mon Sep 01, 2013  9:12 PM       Call pt counts stable ------

## 2013-09-23 ENCOUNTER — Other Ambulatory Visit: Payer: Self-pay | Admitting: *Deleted

## 2013-09-23 DIAGNOSIS — D474 Osteomyelofibrosis: Secondary | ICD-10-CM

## 2013-09-23 DIAGNOSIS — R161 Splenomegaly, not elsewhere classified: Secondary | ICD-10-CM

## 2013-09-23 MED ORDER — HYDROCODONE-ACETAMINOPHEN 5-325 MG PO TABS: ORAL_TABLET | ORAL | Status: DC

## 2013-09-23 NOTE — Telephone Encounter (Signed)
Received call from pt requesting refill on his pain med.  Will p/u tomorrow.

## 2013-10-03 ENCOUNTER — Ambulatory Visit (HOSPITAL_COMMUNITY)
Admission: RE | Admit: 2013-10-03 | Discharge: 2013-10-03 | Disposition: A | Discharge status: Home / Self Care | Payer: BC Managed Care – PPO | Source: Ambulatory Visit | Attending: Oncology | Admitting: Oncology

## 2013-10-03 DIAGNOSIS — D739 Disease of spleen, unspecified: Secondary | ICD-10-CM | POA: Insufficient documentation

## 2013-10-03 DIAGNOSIS — D47Z9 Other specified neoplasms of uncertain behavior of lymphoid, hematopoietic and related tissue: Secondary | ICD-10-CM | POA: Insufficient documentation

## 2013-10-03 DIAGNOSIS — D471 Chronic myeloproliferative disease: Secondary | ICD-10-CM

## 2013-10-03 DIAGNOSIS — R161 Splenomegaly, not elsewhere classified: Secondary | ICD-10-CM | POA: Insufficient documentation

## 2013-10-06 ENCOUNTER — Other Ambulatory Visit: Payer: Self-pay

## 2013-10-07 ENCOUNTER — Other Ambulatory Visit (HOSPITAL_BASED_OUTPATIENT_CLINIC_OR_DEPARTMENT_OTHER): Payer: Self-pay

## 2013-10-07 ENCOUNTER — Ambulatory Visit (HOSPITAL_BASED_OUTPATIENT_CLINIC_OR_DEPARTMENT_OTHER): Payer: Self-pay | Admitting: Oncology

## 2013-10-07 VITALS — BP 135/80 | HR 88 | Temp 97.7°F | Resp 18 | Ht 72.0 in | Wt 217.2 lb

## 2013-10-07 DIAGNOSIS — R161 Splenomegaly, not elsewhere classified: Secondary | ICD-10-CM

## 2013-10-07 DIAGNOSIS — D471 Chronic myeloproliferative disease: Secondary | ICD-10-CM

## 2013-10-07 DIAGNOSIS — D47Z9 Other specified neoplasms of uncertain behavior of lymphoid, hematopoietic and related tissue: Secondary | ICD-10-CM

## 2013-10-07 DIAGNOSIS — D474 Osteomyelofibrosis: Secondary | ICD-10-CM

## 2013-10-07 LAB — MORPHOLOGY
Blasts: 1 % (ref 0–0)
PLT EST: ADEQUATE

## 2013-10-07 LAB — CBC WITH DIFFERENTIAL/PLATELET
Basophils Absolute: 0.1 10*3/uL (ref 0.0–0.1)
EOS%: 0.9 % (ref 0.0–7.0)
HCT: 38.8 % (ref 38.4–49.9)
HGB: 12.7 g/dL — ABNORMAL LOW (ref 13.0–17.1)
LYMPH%: 7.5 % — ABNORMAL LOW (ref 14.0–49.0)
MCH: 26.7 pg — ABNORMAL LOW (ref 27.2–33.4)
MCHC: 32.8 g/dL (ref 32.0–36.0)
MONO#: 0.4 10*3/uL (ref 0.1–0.9)
NEUT#: 8.7 10*3/uL — ABNORMAL HIGH (ref 1.5–6.5)
NEUT%: 86.6 % — ABNORMAL HIGH (ref 39.0–75.0)
Platelets: 203 10*3/uL (ref 140–400)
WBC: 10 10*3/uL (ref 4.0–10.3)
lymph#: 0.8 10*3/uL — ABNORMAL LOW (ref 0.9–3.3)

## 2013-10-07 LAB — LACTATE DEHYDROGENASE (CC13): LDH: 541 U/L — ABNORMAL HIGH (ref 125–245)

## 2013-10-08 NOTE — Progress Notes (Signed)
Hematology and Oncology Follow Up Visit  Sean Walter [id number] Dec 03, 1970 42 y.o. 10/08/2013 8:32 PM   Principle Diagnosis: Encounter Diagnoses  Name Primary?  . Primary myelofibrosis Yes  . Spleen enlargement   . Unclassifiable myelodysplastic/myeloproliferative neoplasm      Interim History:    Followup visit for this 42 year old man who was initially called myelofibrosis but who is more likely a non-BCR-ABL myeloproliferative disorder. He presented with leukocytosis and massive splenomegaly in January 2012. Bone marrow biopsy done in 11/16/10 was hypercellular with prominent proliferation of megakaryocytes many of which had abnormal morphology. Collagen and reticulin stains were focally positive. There were no excess blasts; in fact, on a 500 cell count differential, 0% blasts were recorded. Routine cytogenetics were normal but FISH probes were not done. Peripheral blood was negative for the presence of BCR-ABL transcripts. I He is [name]. (03/07/13)  The patient was started on JAKOFI in February, 2012. Current dose is 25 mg twice daily. He has had a modest response. Stable splenomegaly. I have now followed him since July of 2013. Blood counts remain overall stable as well as his spleen size as assessed by ultrasound. Most recent study done in anticipation of today's visit on 10/03/2013 measures spleen at 16.6 x 17.8 x 5.7 cm compared with a September study where measurements were 14 x 16.7 x 5.1.  He continues to have intermittent left upper quadrant abdominal pain particularly if he exercises or works too much. He works at [employer] but is trying not to lift heavy items. He has become dependent on Percocet but does not overuse it.   He has had no interim medical problems. No infections. He is working extra hours for the holiday season at [employer] and has generalized muscle soreness.  Medications: reviewed  Allergies: No Known Allergies  Review of Systems: Hematology:  No bleeding or bruising ENT ROS: No sore throat Breast ROS:  Respiratory ROS: No cough or dyspnea Cardiovascular ROS:   No chest pain or palpitations Gastrointestinal ROS:   Persistent intermittent left upper quadrant abdominal discomfort Genito-Urinary ROS: Not questioned Musculoskeletal ROS: Muscles sore after working long hours at [employer] Neurological ROS: No headache or change in vision Dermatological ROS: No rash Remaining ROS negative.  Physical Exam: Blood pressure 135/80, pulse 88, temperature 97.7 F (36.5 C), temperature source Oral, resp. rate 18, height 6' (1.829 m), weight 217 lb 3.2 oz (98.521 kg). Wt Readings from Last 3 Encounters:  10/07/13 217 lb 3.2 oz (98.521 kg)  07/04/13 217 lb 6.4 oz (98.612 kg)  03/11/13 219 lb 1.6 oz (99.383 kg)     General appearance: [name] HENNT: Pharynx no erythema, exudate, mass, or ulcer. No thyromegaly or thyroid nodules Lymph nodes: No cervical, supraclavicular, or axillary lymphadenopathy Breasts:  Lungs: Clear to auscultation, resonant to percussion throughout Heart: Regular rhythm, no murmur, no gallop, no rub, no click, no edema Abdomen: Soft, nontender, normal bowel sounds, spleen enlarged 8 cm by exam compared to my exam of September 12 when I measured the spleen at 5 cm. Difficult exam however. Extremities: No edema, no calf tenderness Musculoskeletal: no joint deformities GU:  Vascular: Carotid pulses 2+, no bruits,  Neurologic: Alert, oriented, PERRLA, optic discs sharp and vessels normal, no hemorrhage or exudate, cranial nerves grossly normal, motor strength 5 over 5, reflexes 1+ symmetric, upper body coordination normal, gait normal, Skin: No rash or ecchymosis. Multiple tattoos  Lab Results: CBC W/Diff    Component Value Date/Time   WBC  10.0 10/07/2013 1532   WBC 9.6 06/01/2013 0854   RBC 4.76 10/07/2013 1532   RBC 4.64 06/01/2013 0854   HGB 12.7* 10/07/2013 1532   HGB 13.9 06/01/2013 0907    HCT 38.8 10/07/2013 1532   HCT 41.0 06/01/2013 0907   PLT 203 10/07/2013 1532   PLT 126* 06/01/2013 0854   MCV 81.5 10/07/2013 1532   MCV 81.7 06/01/2013 0854   MCH 26.7* 10/07/2013 1532   MCH 27.4 06/01/2013 0854   MCHC 32.8 10/07/2013 1532   MCHC 33.5 06/01/2013 0854   RDW 17.6* 10/07/2013 1532   RDW 16.3* 06/01/2013 0854   LYMPHSABS 0.8* 10/07/2013 1532   LYMPHSABS 1.0 06/01/2013 0854   MONOABS 0.4 10/07/2013 1532   MONOABS 0.4 06/01/2013 0854   EOSABS 0.1 10/07/2013 1532   EOSABS 0.1 06/01/2013 0854   BASOSABS 0.1 10/07/2013 1532   BASOSABS 0.1 06/01/2013 0854     Chemistry      Component Value Date/Time   NA 140 09/01/2013 0931   NA 137 06/01/2013 0907   K 3.7 09/01/2013 0931   K 3.8 06/01/2013 0907   CL 103 06/01/2013 0907   CL 106 03/07/2013 0826   CO2 23 09/01/2013 0931   CO2 28 07/29/2012 0637   BUN 10.4 09/01/2013 0931   BUN 8 06/01/2013 0907   CREATININE 1.2 09/01/2013 0931   CREATININE 1.60* 06/01/2013 0907      Component Value Date/Time   CALCIUM 10.0 09/01/2013 0931   CALCIUM 9.0 07/29/2012 0637   ALKPHOS 70 09/01/2013 0931   ALKPHOS 50 06/01/2013 0854   AST 26 09/01/2013 0931   AST 25 06/01/2013 0854   ALT 40 09/01/2013 0931   ALT 29 06/01/2013 0854   BILITOT 0.41 09/01/2013 0931   BILITOT 0.7 06/01/2013 0854       Radiological Studies: US Abdomen Limited  10/03/2013   CLINICAL DATA:  Myeloproliferative disorder.  Assess spleen size.  EXAM: LIMITED ABDOMEN ULTRASOUND FOR ASCITES  TECHNIQUE: Limited ultrasound survey for ascites was performed in all four abdominal quadrants.  COMPARISON:  Abdominal ultrasound 06/25/2013.  FINDINGS: The spleen measures 16.6 x 17.8 x 5.7 cm giving a splenic volume of 884.3 cm3, previously 14 x 5.1 x 16.7 cm with a volume of 617.6 cm3.  No significant interval change in size of 15 mm hyperechoic lesion within the inferior aspect of the spleen, indeterminate, above potentially representing a benign hemangioma.  The left kidney measures 10.6  cm.  No hydronephrosis.  IMPRESSION: Splenomegaly. Unchanged indeterminate echogenic lesion lower pole of spleen.   Electronically Signed   By: [name] M.D.   On: 10/03/2013 10:02    Impression:  #1. 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 Partial response to JAKOFI. Plan continue the same. He did have a transplant evaluation at [facility]. He does not a suitable donor at this time. He is still considering whether or not to pursue this.   CC: Patient Care Team: Pcp Not In System as PCP - General   Levert Feinstein, MD 12/17/20148:32 PM

## 2013-10-09 ENCOUNTER — Telehealth: Payer: Self-pay | Admitting: Oncology

## 2013-10-09 NOTE — Telephone Encounter (Signed)
, °

## 2013-10-20 ENCOUNTER — Telehealth: Payer: Self-pay | Admitting: *Deleted

## 2013-10-20 DIAGNOSIS — R161 Splenomegaly, not elsewhere classified: Secondary | ICD-10-CM

## 2013-10-20 DIAGNOSIS — D474 Osteomyelofibrosis: Secondary | ICD-10-CM

## 2013-10-20 MED ORDER — HYDROCODONE-ACETAMINOPHEN 5-325 MG PO TABS: ORAL_TABLET | ORAL | Status: DC

## 2013-10-20 NOTE — Telephone Encounter (Signed)
Received vm call from pt stating that he needs a refill on his pain med & would like to p/u tomorrow am since he is planning to go to [place] after he gets off work tomorrow pm for the [employer].

## 2013-10-28 ENCOUNTER — Other Ambulatory Visit: Payer: Self-pay | Admitting: *Deleted

## 2013-10-28 DIAGNOSIS — D474 Osteomyelofibrosis: Secondary | ICD-10-CM

## 2013-10-28 MED ORDER — RUXOLITINIB PHOSPHATE 25 MG PO TABS: 25.0000 mg | ORAL_TABLET | Freq: Two times a day (BID) | ORAL | Status: DC

## 2013-11-03 ENCOUNTER — Other Ambulatory Visit: Payer: Self-pay

## 2013-11-04 ENCOUNTER — Ambulatory Visit (HOSPITAL_BASED_OUTPATIENT_CLINIC_OR_DEPARTMENT_OTHER): Payer: Self-pay

## 2013-11-04 ENCOUNTER — Telehealth: Payer: Self-pay | Admitting: Oncology

## 2013-11-04 ENCOUNTER — Encounter (INDEPENDENT_AMBULATORY_CARE_PROVIDER_SITE_OTHER): Payer: Self-pay

## 2013-11-04 DIAGNOSIS — D471 Chronic myeloproliferative disease: Secondary | ICD-10-CM

## 2013-11-04 DIAGNOSIS — R161 Splenomegaly, not elsewhere classified: Secondary | ICD-10-CM

## 2013-11-04 DIAGNOSIS — D474 Osteomyelofibrosis: Secondary | ICD-10-CM

## 2013-11-04 LAB — CBC WITH DIFFERENTIAL/PLATELET
BASO%: 0.6 % (ref 0.0–2.0)
BASOS ABS: 0.1 10*3/uL (ref 0.0–0.1)
EOS ABS: 0.1 10*3/uL (ref 0.0–0.5)
EOS%: 0.9 % (ref 0.0–7.0)
HCT: 40.5 % (ref 38.4–49.9)
HEMOGLOBIN: 13.2 g/dL (ref 13.0–17.1)
LYMPH%: 8.8 % — ABNORMAL LOW (ref 14.0–49.0)
MCH: 26.5 pg — ABNORMAL LOW (ref 27.2–33.4)
MCHC: 32.5 g/dL (ref 32.0–36.0)
MCV: 81.5 fL (ref 79.3–98.0)
MONO#: 0.4 10*3/uL (ref 0.1–0.9)
MONO%: 3.8 % (ref 0.0–14.0)
NEUT%: 85.9 % — ABNORMAL HIGH (ref 39.0–75.0)
NEUTROS ABS: 9.4 10*3/uL — AB (ref 1.5–6.5)
Platelets: 215 10*3/uL (ref 140–400)
RBC: 4.97 10*6/uL (ref 4.20–5.82)
RDW: 17.7 % — AB (ref 11.0–14.6)
WBC: 10.9 10*3/uL — AB (ref 4.0–10.3)
lymph#: 1 10*3/uL (ref 0.9–3.3)

## 2013-11-04 LAB — MORPHOLOGY: PLT EST: ADEQUATE

## 2013-11-04 NOTE — Telephone Encounter (Signed)
Pt called and r/s labs to today @ 11am

## 2013-11-19 ENCOUNTER — Other Ambulatory Visit: Payer: Self-pay | Admitting: *Deleted

## 2013-11-19 DIAGNOSIS — R161 Splenomegaly, not elsewhere classified: Secondary | ICD-10-CM

## 2013-11-19 DIAGNOSIS — D474 Osteomyelofibrosis: Secondary | ICD-10-CM

## 2013-11-19 MED ORDER — HYDROCODONE-ACETAMINOPHEN 5-325 MG PO TABS: ORAL_TABLET | ORAL | Status: DC

## 2013-11-19 NOTE — Telephone Encounter (Signed)
Patient called and left VM that he would like a refill of his hydrocodone.  Call back is [phone].   Called patient and let him know script is ready for pick up with the injection nurse.  He will come today or tomorrow.

## 2013-11-24 ENCOUNTER — Telehealth: Payer: Self-pay | Admitting: *Deleted

## 2013-11-24 NOTE — Telephone Encounter (Signed)
Lm informed the pt that G will be on call 01/09/14 . gv appt for 01/07/14@ 3:30pm. Made the pt aware that i will mail a letter/avs...td

## 2013-11-28 ENCOUNTER — Other Ambulatory Visit: Payer: Self-pay | Admitting: Oncology

## 2013-11-28 DIAGNOSIS — D471 Chronic myeloproliferative disease: Secondary | ICD-10-CM

## 2013-12-01 ENCOUNTER — Other Ambulatory Visit (HOSPITAL_BASED_OUTPATIENT_CLINIC_OR_DEPARTMENT_OTHER): Payer: Self-pay

## 2013-12-01 DIAGNOSIS — D471 Chronic myeloproliferative disease: Secondary | ICD-10-CM

## 2013-12-01 DIAGNOSIS — D474 Osteomyelofibrosis: Secondary | ICD-10-CM

## 2013-12-01 LAB — CBC & DIFF AND RETIC
BASO%: 0.9 % (ref 0.0–2.0)
Basophils Absolute: 0.1 10*3/uL (ref 0.0–0.1)
EOS ABS: 0.1 10*3/uL (ref 0.0–0.5)
EOS%: 0.8 % (ref 0.0–7.0)
HEMATOCRIT: 42.2 % (ref 38.4–49.9)
HGB: 13.5 g/dL (ref 13.0–17.1)
Immature Retic Fract: 24 % — ABNORMAL HIGH (ref 3.00–10.60)
LYMPH%: 8 % — AB (ref 14.0–49.0)
MCH: 25.8 pg — ABNORMAL LOW (ref 27.2–33.4)
MCHC: 32 g/dL (ref 32.0–36.0)
MCV: 80.7 fL (ref 79.3–98.0)
MONO#: 0.4 10*3/uL (ref 0.1–0.9)
MONO%: 4.9 % (ref 0.0–14.0)
NEUT#: 7.3 10*3/uL — ABNORMAL HIGH (ref 1.5–6.5)
NEUT%: 85.4 % — AB (ref 39.0–75.0)
PLATELETS: 178 10*3/uL (ref 140–400)
RBC: 5.23 10*6/uL (ref 4.20–5.82)
RDW: 17.1 % — ABNORMAL HIGH (ref 11.0–14.6)
Retic %: 1.91 % — ABNORMAL HIGH (ref 0.80–1.80)
Retic Ct Abs: 99.89 10*3/uL — ABNORMAL HIGH (ref 34.80–93.90)
WBC: 8.5 10*3/uL (ref 4.0–10.3)
lymph#: 0.7 10*3/uL — ABNORMAL LOW (ref 0.9–3.3)
nRBC: 1 % — ABNORMAL HIGH (ref 0–0)

## 2013-12-01 LAB — COMPREHENSIVE METABOLIC PANEL (CC13)
ALBUMIN: 4.3 g/dL (ref 3.5–5.0)
ALT: 30 U/L (ref 0–55)
ANION GAP: 11 meq/L (ref 3–11)
AST: 22 U/L (ref 5–34)
Alkaline Phosphatase: 76 U/L (ref 40–150)
BILIRUBIN TOTAL: 0.59 mg/dL (ref 0.20–1.20)
BUN: 11.7 mg/dL (ref 7.0–26.0)
CALCIUM: 9.7 mg/dL (ref 8.4–10.4)
CHLORIDE: 104 meq/L (ref 98–109)
CO2: 25 meq/L (ref 22–29)
Creatinine: 1.5 mg/dL — ABNORMAL HIGH (ref 0.7–1.3)
GLUCOSE: 118 mg/dL (ref 70–140)
Potassium: 3.8 mEq/L (ref 3.5–5.1)
SODIUM: 139 meq/L (ref 136–145)
TOTAL PROTEIN: 8.1 g/dL (ref 6.4–8.3)

## 2013-12-01 LAB — LACTATE DEHYDROGENASE (CC13): LDH: 593 U/L — AB (ref 125–245)

## 2013-12-01 LAB — TECHNOLOGIST REVIEW: Technologist Review: 1

## 2013-12-17 ENCOUNTER — Other Ambulatory Visit: Payer: Self-pay | Admitting: *Deleted

## 2013-12-17 DIAGNOSIS — D474 Osteomyelofibrosis: Secondary | ICD-10-CM

## 2013-12-17 DIAGNOSIS — R161 Splenomegaly, not elsewhere classified: Secondary | ICD-10-CM

## 2013-12-17 MED ORDER — HYDROCODONE-ACETAMINOPHEN 5-325 MG PO TABS: ORAL_TABLET | ORAL | Status: DC

## 2013-12-17 NOTE — Telephone Encounter (Signed)
Patient called requesting refill of his hydrocodone. Called patient and let him know script is ready for pick up.

## 2014-01-05 ENCOUNTER — Ambulatory Visit (HOSPITAL_COMMUNITY)
Admission: RE | Admit: 2014-01-05 | Discharge: 2014-01-05 | Disposition: A | Discharge status: Home / Self Care | Payer: BC Managed Care – PPO | Source: Ambulatory Visit | Attending: Oncology | Admitting: Oncology

## 2014-01-05 ENCOUNTER — Other Ambulatory Visit (HOSPITAL_BASED_OUTPATIENT_CLINIC_OR_DEPARTMENT_OTHER): Payer: Self-pay

## 2014-01-05 DIAGNOSIS — R161 Splenomegaly, not elsewhere classified: Secondary | ICD-10-CM | POA: Insufficient documentation

## 2014-01-05 DIAGNOSIS — D47Z9 Other specified neoplasms of uncertain behavior of lymphoid, hematopoietic and related tissue: Secondary | ICD-10-CM | POA: Insufficient documentation

## 2014-01-05 DIAGNOSIS — D474 Osteomyelofibrosis: Secondary | ICD-10-CM

## 2014-01-05 DIAGNOSIS — D471 Chronic myeloproliferative disease: Secondary | ICD-10-CM

## 2014-01-05 DIAGNOSIS — C946 Myelodysplastic disease, not classified: Secondary | ICD-10-CM

## 2014-01-05 LAB — COMPREHENSIVE METABOLIC PANEL (CC13)
ALK PHOS: 57 U/L (ref 40–150)
ALT: 31 U/L (ref 0–55)
ANION GAP: 11 meq/L (ref 3–11)
AST: 21 U/L (ref 5–34)
Albumin: 4.1 g/dL (ref 3.5–5.0)
BILIRUBIN TOTAL: 0.49 mg/dL (ref 0.20–1.20)
BUN: 11.3 mg/dL (ref 7.0–26.0)
CO2: 24 mEq/L (ref 22–29)
Calcium: 9.5 mg/dL (ref 8.4–10.4)
Chloride: 106 mEq/L (ref 98–109)
Creatinine: 1.8 mg/dL — ABNORMAL HIGH (ref 0.7–1.3)
GLUCOSE: 71 mg/dL (ref 70–140)
Potassium: 3.9 mEq/L (ref 3.5–5.1)
SODIUM: 141 meq/L (ref 136–145)
Total Protein: 7.8 g/dL (ref 6.4–8.3)

## 2014-01-05 LAB — CBC & DIFF AND RETIC
BASO%: 1 % (ref 0.0–2.0)
Basophils Absolute: 0.1 10*3/uL (ref 0.0–0.1)
EOS%: 0.8 % (ref 0.0–7.0)
Eosinophils Absolute: 0.1 10*3/uL (ref 0.0–0.5)
HCT: 37.1 % — ABNORMAL LOW (ref 38.4–49.9)
HGB: 12 g/dL — ABNORMAL LOW (ref 13.0–17.1)
Immature Retic Fract: 17.4 % — ABNORMAL HIGH (ref 3.00–10.60)
LYMPH%: 10.5 % — AB (ref 14.0–49.0)
MCH: 25.6 pg — AB (ref 27.2–33.4)
MCHC: 32.3 g/dL (ref 32.0–36.0)
MCV: 79.1 fL — AB (ref 79.3–98.0)
MONO#: 0.3 10*3/uL (ref 0.1–0.9)
MONO%: 4.2 % (ref 0.0–14.0)
NEUT%: 83.5 % — ABNORMAL HIGH (ref 39.0–75.0)
NEUTROS ABS: 5.9 10*3/uL (ref 1.5–6.5)
NRBC: 1 % — AB (ref 0–0)
PLATELETS: 134 10*3/uL — AB (ref 140–400)
RBC: 4.69 10*6/uL (ref 4.20–5.82)
RDW: 16.8 % — AB (ref 11.0–14.6)
RETIC %: 1.19 % (ref 0.80–1.80)
Retic Ct Abs: 55.81 10*3/uL (ref 34.80–93.90)
WBC: 7.1 10*3/uL (ref 4.0–10.3)
lymph#: 0.7 10*3/uL — ABNORMAL LOW (ref 0.9–3.3)

## 2014-01-05 LAB — LACTATE DEHYDROGENASE (CC13): LDH: 390 U/L — AB (ref 125–245)

## 2014-01-05 MED ORDER — IOHEXOL 300 MG/ML  SOLN
50.0000 mL | Freq: Once | INTRAMUSCULAR | Status: AC | PRN
  Administered 2014-01-05: 50 mL via ORAL

## 2014-01-05 MED ORDER — IOHEXOL 300 MG/ML  SOLN
100.0000 mL | Freq: Once | INTRAMUSCULAR | Status: AC | PRN
  Administered 2014-01-05: 100 mL via INTRAVENOUS

## 2014-01-07 ENCOUNTER — Ambulatory Visit (HOSPITAL_BASED_OUTPATIENT_CLINIC_OR_DEPARTMENT_OTHER): Payer: Self-pay | Admitting: Oncology

## 2014-01-07 VITALS — BP 167/96 | HR 99 | Temp 97.9°F | Resp 20 | Ht 72.0 in | Wt 220.5 lb

## 2014-01-07 DIAGNOSIS — D474 Osteomyelofibrosis: Secondary | ICD-10-CM

## 2014-01-07 DIAGNOSIS — D471 Chronic myeloproliferative disease: Secondary | ICD-10-CM

## 2014-01-07 DIAGNOSIS — B078 Other viral warts: Secondary | ICD-10-CM

## 2014-01-07 DIAGNOSIS — C946 Myelodysplastic disease, not classified: Secondary | ICD-10-CM

## 2014-01-07 DIAGNOSIS — D47Z9 Other specified neoplasms of uncertain behavior of lymphoid, hematopoietic and related tissue: Secondary | ICD-10-CM

## 2014-01-07 DIAGNOSIS — R161 Splenomegaly, not elsewhere classified: Secondary | ICD-10-CM

## 2014-01-07 DIAGNOSIS — R944 Abnormal results of kidney function studies: Secondary | ICD-10-CM

## 2014-01-07 MED ORDER — HYDROCODONE-ACETAMINOPHEN 5-325 MG PO TABS: ORAL_TABLET | ORAL | Status: DC

## 2014-01-07 NOTE — Progress Notes (Signed)
Hematology and Oncology Follow Up Visit  Sean Walter [id number] July 24, 1971 43 y.o. 01/07/2014 6:56 PM   Principle Diagnosis: Encounter Diagnoses  Name Primary?  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 Spleen enlargement   . Primary myelofibrosis   . Myelofibrosis with myeloid metaplasia   . Splenomegaly      Interim History:  Followup visit for this 43 year old man who was initially called myelofibrosis but who is more likely a non-BCR-ABL myeloproliferative disorder. He presented with leukocytosis and massive splenomegaly in January 2012. Bone marrow biopsy done in 11/16/10 was hypercellular with prominent proliferation of megakaryocytes many of which had abnormal morphology. Collagen and reticulin stains were focally positive. There were no excess blasts; in fact, on a 500 cell count differential, 0% blasts were recorded. Routine cytogenetics were normal but FISH probes were not done. Peripheral blood was negative for the presence of BCR-ABL transcripts. I He is [name]. (03/07/13)  The patient was started on JAKOFI in February, 2012. Current dose is 25 mg twice daily. He has had a modest response. Stable splenomegaly.  I have now followed him since July of 2013. Blood counts remain overall stable as well as his spleen size as assessed by ultrasound. Most recent study on 10/03/2013 measures spleen at 16.6 x 17.8 x 5.7 cm compared with a September study where measurements were 14 x 16.7 x 5.1. I obtained a CT scan in anticipation of today's visit and reviewed this with prior CT scans. Study from January 2012 measured the vertical length of the spleen at 265 cm prior to initiation of JAKOFI. Study done 06/01/2013 spleen vertical length 175.5 cm and the current study on 01/05/2014 vertical length 170.6 cm He is tolerating the JAKOFI well with no obvious side effects. I know today's chem profile shows a creatinine of 1.8 higher than his baseline  which has not been higher than 1.6 in the past and usually runs about 1.4. Not clear whether the elevated creatinine is just due to his increased muscle mass and a tall muscular man.  He continues to have intermittent left upper quadrant abdominal pain and takes Percocet. He is using 90 tablets per month.  Since visit with me in December, he has developed multiple small warts primarily on the back of his neck but also on the front with some larger scattered lesions up to about a half a centimeter on his scalp. He shaves his head. He has seen a dermatologist. Some of the larger lesions were excised. Some of the smaller lesions were frozen. They keep coming back. He wanted to know if they could be from the [facility]. I looked this up for him. Nonmelanoma skin cancers have been seen and possibly associated with the drug. There is no mention of any viral reactivation or condylomata associated with this drug.  Medications: reviewed  Allergies: No Known Allergies  Review of Systems: Hematology:  No bleeding or bruising ENT ROS: No sore throat Breast ROS:  Respiratory ROS: No cough or dyspnea  Cardiovascular ROS:  No chest pain or palpitations Gastrointestinal ROS: Intermittent left upper quadrant pain   Genito-Urinary ROS: Not questioned Musculoskeletal ROS: No muscle bone or joint pain Neurological ROS: No headache or change in vision Dermatological ROS: See above Remaining ROS negative:   Physical Exam: Blood pressure 167/96, pulse 99, temperature 97.9 F (36.6 C), temperature source Oral, resp. rate 20, height 6' (1.829 m), weight 220 lb 8 oz (100.018 kg), SpO2 100.00%. Wt Readings from Last 3 Encounters:  01/07/14 220 lb 8 oz (100.018 kg)  10/07/13 217 lb 3.2 oz (98.521 kg)  07/04/13 217 lb 6.4 oz (98.612 kg)     General appearance: [name]  HENNT: Pharynx no erythema, exudate, mass, or ulcer. No thyromegaly or thyroid nodules Lymph nodes: No cervical,  supraclavicular, or axillary lymphadenopathy Breasts:  Lungs: Clear to auscultation, resonant to percussion throughout Heart: Regular rhythm, no murmur, no gallop, no rub, no click, no edema Abdomen: Soft, nontender, normal bowel sounds, no mass, no organomegaly Extremities: No edema, no calf tenderness Musculoskeletal: no joint deformities GU:  Vascular: Carotid pulses 2+, no bruits, distal pulses: Dorsalis pedis 1+ symmetric Neurologic: Alert, oriented, PERRLA, optic discs sharp and vessels normal, no hemorrhage or exudate, cranial nerves grossly normal, motor strength 5 over 5, reflexes 1+ symmetric, upper body coordination normal, gait normal, Skin: Numerous warts on his neck too numerous to count. Larger lesions scattered on his scalp.   Lab Results: CBC W/Diff    Component Value Date/Time   WBC 7.1 01/05/2014 1035   WBC 9.6 06/01/2013 0854   RBC 4.69 01/05/2014 1035   RBC 4.64 06/01/2013 0854   HGB 12.0* 01/05/2014 1035   HGB 13.9 06/01/2013 0907   HCT 37.1* 01/05/2014 1035   HCT 41.0 06/01/2013 0907   PLT 134* 01/05/2014 1035   PLT 126* 06/01/2013 0854   MCV 79.1* 01/05/2014 1035   MCV 81.7 06/01/2013 0854   MCH 25.6* 01/05/2014 1035   MCH 27.4 06/01/2013 0854   MCHC 32.3 01/05/2014 1035   MCHC 33.5 06/01/2013 0854   RDW 16.8* 01/05/2014 1035   RDW 16.3* 06/01/2013 0854   LYMPHSABS 0.7* 01/05/2014 1035   LYMPHSABS 1.0 06/01/2013 0854   MONOABS 0.3 01/05/2014 1035   MONOABS 0.4 06/01/2013 0854   EOSABS 0.1 01/05/2014 1035   EOSABS 0.1 06/01/2013 0854   BASOSABS 0.1 01/05/2014 1035   BASOSABS 0.1 06/01/2013 0854     Chemistry      Component Value Date/Time   NA 141 01/05/2014 1035   NA 137 06/01/2013 0907   K 3.9 01/05/2014 1035   K 3.8 06/01/2013 0907   CL 103 06/01/2013 0907   CL 106 03/07/2013 0826   CO2 24 01/05/2014 1035   CO2 28 07/29/2012 0637   BUN 11.3 01/05/2014 1035   BUN 8 06/01/2013 0907   CREATININE 1.8* 01/05/2014 1035   CREATININE 1.60* 06/01/2013 0907      Component Value  Date/Time   CALCIUM 9.5 01/05/2014 1035   CALCIUM 9.0 07/29/2012 0637   ALKPHOS 57 01/05/2014 1035   ALKPHOS 50 06/01/2013 0854   AST 21 01/05/2014 1035   AST 25 06/01/2013 0854   ALT 31 01/05/2014 1035   ALT 29 06/01/2013 0854   BILITOT 0.49 01/05/2014 1035   BILITOT 0.7 06/01/2013 0854       Radiological Studies: Ct Abdomen Pelvis W Wo Contrast  01/05/2014   CLINICAL DATA:  Myeloproliferative disorder awaiting bone marrow transplant. Follow up splenomegaly.  EXAM: CT ABDOMEN AND PELVIS WITHOUT AND WITH CONTRAST  TECHNIQUE: Multidetector CT imaging of the abdomen and pelvis was performed without contrast material in one or both body regions, followed by contrast material(s) and further sections in one or both body regions.  CONTRAST:  12mL OMNIPAQUE IOHEXOL 300 MG/ML SOLN, 142mL OMNIPAQUE IOHEXOL 300 MG/ML SOLN  COMPARISON:  US ABDOMEN LIMITED dated 10/03/2013; US ABDOMEN COMPLETE dated 06/25/2013; CT ABD/PELVIS W CM dated 06/01/2013  FINDINGS: The visualized lung bases are clear. There  is no significant pleural or pericardial effusion.  Splenomegaly appears unchanged. The spleen measures 14.6 x 9.0 x 17.1 cm. No focal lesions are identified. The liver, pancreas, adrenal glands and kidneys appear normal. There is no hydronephrosis. The gallbladder is contracted.  The stomach, small bowel, appendix and colon appear normal. No enlarged abdominal pelvic lymph nodes are identified. There is stable mild atherosclerosis. The urinary bladder and prostate gland appear normal.  There are no worrisome osseous findings. There are stable lumbosacral assimilation joints and asymmetric degenerative changes along the right sacroiliac joint.  IMPRESSION: 1. Stable splenomegaly.  No focal splenic lesions identified. 2. No adenopathy or acute findings.   Electronically Signed   By: Camie Patience M.D.   On: 01/05/2014 16:11    Impression:   #1. 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  Good partial response to JAKOFI. Plan continue the same.  He did have a transplant evaluation at [facility]. He does not a suitable donor at this time.   #2. Sudden appearance of multiple cutaneous warts in a unusual location around his neck and on his scalp. I could not find an association with the [place] although I am sure it is possible. He is going to contact the company and see if there have been any other reports of condyloma associated with the drug.  #3. Rising creatinine This may just be due to his body habitus(tall muscular male) .I will continue to monitor. No specific interventions at this time. Kidneys appear grossly normal on recent CT scan.       CC: Patient Care Team: Pcp Not In System as PCP - General   [name], MD 3/18/20156:56 PM

## 2014-01-09 ENCOUNTER — Ambulatory Visit: Payer: Self-pay | Admitting: Oncology

## 2014-01-09 ENCOUNTER — Telehealth: Payer: Self-pay | Admitting: Oncology

## 2014-01-09 NOTE — Telephone Encounter (Signed)
PT TO SEE JG AT [facility]

## 2014-02-11 ENCOUNTER — Other Ambulatory Visit: Payer: Self-pay | Admitting: Oncology

## 2014-02-11 ENCOUNTER — Telehealth: Payer: Self-pay | Admitting: *Deleted

## 2014-02-11 DIAGNOSIS — R161 Splenomegaly, not elsewhere classified: Secondary | ICD-10-CM

## 2014-02-11 DIAGNOSIS — D474 Osteomyelofibrosis: Secondary | ICD-10-CM

## 2014-02-11 MED ORDER — HYDROCODONE-ACETAMINOPHEN 5-325 MG PO TABS: ORAL_TABLET | ORAL | Status: DC

## 2014-02-11 NOTE — Telephone Encounter (Signed)
Patient called requesting refill on his pain medication.  Called patient back and gave him [facility] number [phone] to contact Dr. Beryle Beams.  Also noted in his last visit that Dr. Beryle Beams wanted him to have monthly labs @[facility]  starting 4-14 and he wanted to see him @the  [facility] on 6/22 or 6/23.  None of these appts. Have been made.  Made patient aware of this and he will discuss with [facility] when he calls for pain medication refill.  Will also route this to Yvonna Alanis RN and Dr. Beryle Beams.

## 2014-02-12 ENCOUNTER — Telehealth: Payer: Self-pay | Admitting: *Deleted

## 2014-02-12 ENCOUNTER — Other Ambulatory Visit: Payer: Self-pay | Admitting: Oncology

## 2014-02-12 ENCOUNTER — Other Ambulatory Visit: Payer: Self-pay | Admitting: *Deleted

## 2014-02-12 NOTE — Telephone Encounter (Signed)
This patient still has standing orders for monthly lab at [facility] - please have him keep those appts until he is seen here. If they didn't give hime dates have him call the cancer center at [place]

## 2014-02-12 NOTE — Telephone Encounter (Signed)
Opened to review med order

## 2014-02-12 NOTE — Telephone Encounter (Signed)
Pt came to pick up script, triage read note from Gretchen Short rn and made pt an appt for 6/22 at 0815. Is this as you wanted? Also discussed lab schedule with pt but he was not sure when he needed that appt, will be glad to call him back with date for labs, is the a date you would like for him to have the labs?pt picked up script

## 2014-03-10 ENCOUNTER — Other Ambulatory Visit: Payer: Self-pay | Admitting: *Deleted

## 2014-03-10 DIAGNOSIS — R161 Splenomegaly, not elsewhere classified: Secondary | ICD-10-CM

## 2014-03-10 DIAGNOSIS — D471 Chronic myeloproliferative disease: Secondary | ICD-10-CM

## 2014-03-10 DIAGNOSIS — D474 Osteomyelofibrosis: Secondary | ICD-10-CM

## 2014-03-10 MED ORDER — HYDROCODONE-ACETAMINOPHEN 5-325 MG PO TABS: ORAL_TABLET | ORAL | Status: DC

## 2014-03-10 NOTE — Telephone Encounter (Signed)
Rx ready - pt called. 

## 2014-04-10 ENCOUNTER — Telehealth: Payer: Self-pay | Admitting: Internal Medicine

## 2014-04-10 NOTE — Telephone Encounter (Signed)
Received a page in the on-call, the patient wanted his pain medication filled, stated that he will run out of them this weekend and that is was the reason for his call. I explained to him that the clinic was closed today and unfortunately his pain medication could not be called in, a paper prescription would have to be picked up. He has an appointment on Monday and told me that since the clinic was closed he said: "that's alright and there is nothing we can do about it". I called him back to remind him of his appointment on Monday, asked about his need for pain medication for the weekend but again he stated" "that is all right, there is nothing we can do about it" and hung up.

## 2014-04-13 ENCOUNTER — Encounter: Payer: Self-pay | Admitting: Oncology

## 2014-04-13 ENCOUNTER — Ambulatory Visit (INDEPENDENT_AMBULATORY_CARE_PROVIDER_SITE_OTHER): Payer: BC Managed Care – PPO | Admitting: Oncology

## 2014-04-13 VITALS — BP 142/84 | HR 83 | Temp 97.7°F | Resp 20 | Ht 71.0 in | Wt 223.2 lb

## 2014-04-13 DIAGNOSIS — R161 Splenomegaly, not elsewhere classified: Secondary | ICD-10-CM

## 2014-04-13 DIAGNOSIS — D471 Chronic myeloproliferative disease: Secondary | ICD-10-CM | POA: Diagnosis not present

## 2014-04-13 DIAGNOSIS — D474 Osteomyelofibrosis: Secondary | ICD-10-CM | POA: Diagnosis not present

## 2014-04-13 DIAGNOSIS — D47Z9 Other specified neoplasms of uncertain behavior of lymphoid, hematopoietic and related tissue: Secondary | ICD-10-CM | POA: Diagnosis not present

## 2014-04-13 DIAGNOSIS — C946 Myelodysplastic disease, not classified: Secondary | ICD-10-CM

## 2014-04-13 DIAGNOSIS — B079 Viral wart, unspecified: Secondary | ICD-10-CM

## 2014-04-13 LAB — CBC WITH DIFFERENTIAL/PLATELET
Basophils Absolute: 0.1 10*3/uL (ref 0.0–0.1)
Basophils Relative: 1 % (ref 0–1)
Eosinophils Absolute: 0.1 10*3/uL (ref 0.0–0.7)
Eosinophils Relative: 1 % (ref 0–5)
HEMATOCRIT: 37.4 % — AB (ref 39.0–52.0)
HEMOGLOBIN: 12.8 g/dL — AB (ref 13.0–17.0)
Lymphocytes Relative: 3 % — ABNORMAL LOW (ref 12–46)
Lymphs Abs: 0.3 10*3/uL — ABNORMAL LOW (ref 0.7–4.0)
MCH: 26.5 pg (ref 26.0–34.0)
MCHC: 34.2 g/dL (ref 30.0–36.0)
MCV: 77.4 fL — ABNORMAL LOW (ref 78.0–100.0)
Monocytes Absolute: 0.2 10*3/uL (ref 0.1–1.0)
Monocytes Relative: 2 % — ABNORMAL LOW (ref 3–12)
Neutro Abs: 8 10*3/uL — ABNORMAL HIGH (ref 1.7–7.7)
Neutrophils Relative %: 93 % — ABNORMAL HIGH (ref 43–77)
Platelets: 182 10*3/uL (ref 150–400)
RBC: 4.83 MIL/uL (ref 4.22–5.81)
RDW: 17 % — AB (ref 11.5–15.5)
WBC: 8.6 10*3/uL (ref 4.0–10.5)

## 2014-04-13 LAB — COMPREHENSIVE METABOLIC PANEL
ALT: 29 U/L (ref 0–53)
AST: 19 U/L (ref 0–37)
Albumin: 4.6 g/dL (ref 3.5–5.2)
Alkaline Phosphatase: 65 U/L (ref 39–117)
BUN: 9 mg/dL (ref 6–23)
CALCIUM: 9.5 mg/dL (ref 8.4–10.5)
CO2: 28 meq/L (ref 19–32)
CREATININE: 1.35 mg/dL (ref 0.50–1.35)
Chloride: 100 mEq/L (ref 96–112)
GLUCOSE: 85 mg/dL (ref 70–99)
Potassium: 3.8 mEq/L (ref 3.5–5.3)
Sodium: 136 mEq/L (ref 135–145)
Total Bilirubin: 0.7 mg/dL (ref 0.2–1.2)
Total Protein: 7.7 g/dL (ref 6.0–8.3)

## 2014-04-13 LAB — URIC ACID: Uric Acid, Serum: 6.7 mg/dL (ref 4.0–7.8)

## 2014-04-13 MED ORDER — HYDROCODONE-ACETAMINOPHEN 5-325 MG PO TABS: ORAL_TABLET | ORAL | Status: DC

## 2014-04-13 NOTE — Patient Instructions (Signed)
To lab today then every month at [facility] Schedule Ultrasound of spleen at [facility] for Friday, 6/26 Return visit with Dr Darnell Level in 3 months 9/21 Ultrasound Friday before visit  Continue Jakafi at current dose

## 2014-04-13 NOTE — Progress Notes (Signed)
Patient ID: Sean Walter, male   DOB: Mar 07, 1971, 43 y.o.   MRN: [id number] Hematology and Oncology Follow Up Visit  Sean Walter [id number] 08-Mar-1971 43 y.o. 04/13/2014 8:59 AM   Principle Diagnosis: Encounter Diagnoses  Name Primary?  . Primary myelofibrosis Yes  . Spleen enlargement   . Unclassifiable myelodysplastic/myeloproliferative neoplasm   . Myelofibrosis with myeloid metaplasia   . Splenomegaly      Interim History:  Clinical Summary:  Followup visit for this 43 year old man who was initially called myelofibrosis but who is more likely a non-BCR-ABL myeloproliferative disorder. He presented with leukocytosis and massive splenomegaly in January 2012. Bone marrow biopsy done in 11/16/10 was hypercellular with prominent proliferation of megakaryocytes many of which had abnormal morphology. Collagen and reticulin stains were focally positive. There were no excess blasts; in fact, on a 500 cell count differential, 0% blasts were recorded. Routine cytogenetics were normal but FISH probes were not done. Peripheral blood was negative for the presence of BCR-ABL transcripts. I He is [name]. (03/07/13)  The patient was started on JAKAFI (ruxolitinib) in February, 2012. Current dose is 25 mg twice daily. He has had a modest response. Stable splenomegaly.  I have now followed him since July of 2013. Blood counts remain overall stable as well as his spleen size as assessed by ultrasound. Most recent study on 10/03/2013 measures spleen at 16.6 x 17.8 x 5.7 cm compared with a September study where measurements were 14 x 16.7 x 5.1.  I obtained a CT scan on 01/05/14. Prior study done January 2012 measured the vertical length of the spleen at 25 cm prior to initiation of JAKOFI. Study done 06/01/2013 spleen vertical length 17.6 cm.  On 01/05/2014 vertical length 17.1 cm  He is tolerating the [place] well with no obvious side effects.  He has had no interim medical problems.  Unfortunately in the transition between [place] and [place], his monthly lab work was not done and we have no new values since March 16. He continues to use 90 Percocet monthly for control left upper quadrant discomfort. I discussed with him today that he really needs to try to cut back on the number of painkillers that he uses. I am concerned that he is getting addicted to them. He agreed to try to cut back.  He continues to have a problem with multiple small verrucae primarily on his posterior neck and is seeing a dermatologist intermittently.   Medications: reviewed  Allergies: No Known Allergies  Review of Systems: Hematology:  No bleeding or bruising ENT ROS:  Breast ROS:  Respiratory ROS: No cough or dyspnea Cardiovascular ROS:  No chest pain or palpitations Gastrointestinal ROS: Chronic left upper quadrant discomfort   Genito-Urinary ROS: Not questioned Musculoskeletal ROS: No muscle bone or joint pain Neurological ROS: No headache or change in vision Dermatological ROS: See above Remaining ROS negative:   Physical Exam: Blood pressure 142/84, pulse 83, temperature 97.7 F (36.5 C), temperature source Oral, resp. rate 20, height _0  (1.803 m), weight 223 lb 3.2 oz (101.243 kg), SpO2 99.00%. Wt Readings from Last 3 Encounters:  04/13/14 223 lb 3.2 oz (101.243 kg)  01/07/14 220 lb 8 oz (100.018 kg)  10/07/13 217 lb 3.2 oz (98.521 kg)     General appearance: [name] Sean Walter: Pharynx no erythema, exudate, mass, or ulcer. No thyromegaly or thyroid nodules Lymph nodes: No cervical, supraclavicular, or axillary lymphadenopathy Breasts: Lungs: Clear to auscultation, resonant to  percussion throughout Heart: Regular rhythm, no murmur, no gallop, no rub, no click, no edema Abdomen: Soft, nontender, normal bowel sounds, no mass, spleen remains difficult to examine on exam only approximately 5 cm by my exam which is disproportionate to the size  seen on scans. Extremities: No edema, no calf tenderness Musculoskeletal: no joint deformities GU:  Vascular: Carotid pulses 2+, no bruits, distal pulses: Dorsalis pedis 1+ symmetric Neurologic: Alert, oriented, PERRLA, optic discs sharp and vessels normal, no hemorrhage or exudate, cranial nerves grossly normal, motor strength 5 over 5, reflexes 1+ symmetric, upper body coordination normal, gait normal, Skin: Innumerable warts on the posterior neck, some on the face  Lab Results: CBC W/Diff    Component Value Date/Time   WBC 7.1 01/05/2014 1035   WBC 9.6 06/01/2013 0854   RBC 4.69 01/05/2014 1035   RBC 4.64 06/01/2013 0854   HGB 12.0* 01/05/2014 1035   HGB 13.9 06/01/2013 0907   HCT 37.1* 01/05/2014 1035   HCT 41.0 06/01/2013 0907   PLT 134* 01/05/2014 1035   PLT 126* 06/01/2013 0854   MCV 79.1* 01/05/2014 1035   MCV 81.7 06/01/2013 0854   MCH 25.6* 01/05/2014 1035   MCH 27.4 06/01/2013 0854   MCHC 32.3 01/05/2014 1035   MCHC 33.5 06/01/2013 0854   RDW 16.8* 01/05/2014 1035   RDW 16.3* 06/01/2013 0854   LYMPHSABS 0.7* 01/05/2014 1035   LYMPHSABS 1.0 06/01/2013 0854   MONOABS 0.3 01/05/2014 1035   MONOABS 0.4 06/01/2013 0854   EOSABS 0.1 01/05/2014 1035   EOSABS 0.1 06/01/2013 0854   BASOSABS 0.1 01/05/2014 1035   BASOSABS 0.1 06/01/2013 0854     Chemistry      Component Value Date/Time   NA 141 01/05/2014 1035   NA 137 06/01/2013 0907   K 3.9 01/05/2014 1035   K 3.8 06/01/2013 0907   CL 103 06/01/2013 0907   CL 106 03/07/2013 0826   CO2 24 01/05/2014 1035   CO2 28 07/29/2012 0637   BUN 11.3 01/05/2014 1035   BUN 8 06/01/2013 0907   CREATININE 1.8* 01/05/2014 1035   CREATININE 1.60* 06/01/2013 0907      Component Value Date/Time   CALCIUM 9.5 01/05/2014 1035   CALCIUM 9.0 07/29/2012 0637   ALKPHOS 57 01/05/2014 1035   ALKPHOS 50 06/01/2013 0854   AST 21 01/05/2014 1035   AST 25 06/01/2013 0854   ALT 31 01/05/2014 1035   ALT 29 06/01/2013 0854   BILITOT 0.49 01/05/2014 1035   BILITOT 0.7 06/01/2013  0854       Impression:  #1. JAK-2 positive, bcr-abl negative,  myeloproliferative disorder with focal marrow fibrosis and marked splenomegaly.  Good partial response to JAKOFI.  Plan continue the same. Monitor blood counts on a monthly basis. Ultrasounds of the spleen every 3 months. CT scans annual. He did have a transplant evaluation at [facility]. He does not a suitable donor at this time. He continues to work with other family members to see if they can provide support to him if he undergoes an eventual transplant.  #2.  Multiple cutaneous warts in a unusual location around his neck and on his scalp.  I could not find an association with the [place] although I am sure it is possible.    #3. Rise in creatinine. Normal BUN. Possibly related to his muscle mass. Rule out drug toxicity. Repeat value today pending.     CC: Patient Care Team: Pcp Not In System as PCP - General  [name], MD 6/22/20158:59 AM

## 2014-04-14 ENCOUNTER — Telehealth: Payer: Self-pay | Admitting: *Deleted

## 2014-04-14 NOTE — Telephone Encounter (Signed)
Message left at home phone for pt to call Dr. Azucena Freed office at [phone] and ask for this RN for message. Waiting to deliver message Per Dr. Beryle Beams, "blood counts stable. Continue current dose of Jakafi" Leroy Sea, 04/14/14, 11:02AM

## 2014-04-14 NOTE — Telephone Encounter (Signed)
Pt contacted and given info from Dr. Beryle Beams as documented earlier- pt verbalized understanding of this info and thanked RN for phone call follow up. Yvonna Alanis, RN, 04/14/14, 12:28 PM

## 2014-04-17 ENCOUNTER — Ambulatory Visit (HOSPITAL_COMMUNITY)
Admission: RE | Admit: 2014-04-17 | Discharge: 2014-04-17 | Disposition: A | Discharge status: Home / Self Care | Payer: BC Managed Care – PPO | Source: Ambulatory Visit | Attending: Oncology | Admitting: Oncology

## 2014-04-17 DIAGNOSIS — C946 Myelodysplastic disease, not classified: Secondary | ICD-10-CM

## 2014-04-17 DIAGNOSIS — D471 Chronic myeloproliferative disease: Secondary | ICD-10-CM

## 2014-04-17 DIAGNOSIS — D47Z9 Other specified neoplasms of uncertain behavior of lymphoid, hematopoietic and related tissue: Secondary | ICD-10-CM | POA: Insufficient documentation

## 2014-04-17 DIAGNOSIS — D474 Osteomyelofibrosis: Secondary | ICD-10-CM

## 2014-04-17 DIAGNOSIS — R161 Splenomegaly, not elsewhere classified: Secondary | ICD-10-CM

## 2014-04-23 ENCOUNTER — Telehealth: Payer: Self-pay | Admitting: *Deleted

## 2014-04-23 NOTE — Telephone Encounter (Signed)
Returned call from pt - pt informed Abd u/s showed significant decreased spleen size from last study per Dr Beryle Beams.  Pt stated very good; no questions.

## 2014-04-23 NOTE — Telephone Encounter (Signed)
Message copied by Ebbie Latus on Thu Apr 23, 2014  8:44 AM ------      Message from: Sean Walter      Created: Mon Apr 20, 2014  2:35 PM       Call pt: significant decrease in spleen size since last study ------

## 2014-05-08 ENCOUNTER — Other Ambulatory Visit: Payer: Self-pay | Admitting: *Deleted

## 2014-05-08 DIAGNOSIS — D47Z9 Other specified neoplasms of uncertain behavior of lymphoid, hematopoietic and related tissue: Secondary | ICD-10-CM

## 2014-05-08 DIAGNOSIS — R161 Splenomegaly, not elsewhere classified: Secondary | ICD-10-CM

## 2014-05-08 DIAGNOSIS — D474 Osteomyelofibrosis: Secondary | ICD-10-CM

## 2014-05-08 DIAGNOSIS — C946 Myelodysplastic disease, not classified: Secondary | ICD-10-CM

## 2014-05-08 DIAGNOSIS — D471 Chronic myeloproliferative disease: Secondary | ICD-10-CM

## 2014-05-08 MED ORDER — HYDROCODONE-ACETAMINOPHEN 5-325 MG PO TABS: ORAL_TABLET | ORAL | Status: DC

## 2014-05-08 NOTE — Addendum Note (Signed)
Addended by: Ebbie Latus on: 05/08/2014 01:28 PM   Modules accepted: Orders

## 2014-05-08 NOTE — Telephone Encounter (Signed)
Rx ready - pt called. 

## 2014-05-08 NOTE — Telephone Encounter (Signed)
Last refilled 04/13/14.

## 2014-05-08 NOTE — Telephone Encounter (Signed)
Dr Beryle Beams stated he had trouble printing; had to re-print. See next encounter.

## 2014-05-08 NOTE — Telephone Encounter (Signed)
Dr Beryle Beams stated he had trouble printing; had to re-print.

## 2014-06-03 ENCOUNTER — Telehealth: Payer: Self-pay | Admitting: *Deleted

## 2014-06-03 ENCOUNTER — Other Ambulatory Visit: Payer: Self-pay | Admitting: Oncology

## 2014-06-03 ENCOUNTER — Other Ambulatory Visit (INDEPENDENT_AMBULATORY_CARE_PROVIDER_SITE_OTHER): Payer: BC Managed Care – PPO

## 2014-06-03 ENCOUNTER — Ambulatory Visit (INDEPENDENT_AMBULATORY_CARE_PROVIDER_SITE_OTHER): Payer: BC Managed Care – PPO | Admitting: Internal Medicine

## 2014-06-03 ENCOUNTER — Encounter: Payer: Self-pay | Admitting: Oncology

## 2014-06-03 VITALS — BP 139/86 | HR 88 | Temp 97.9°F

## 2014-06-03 DIAGNOSIS — C946 Myelodysplastic disease, not classified: Secondary | ICD-10-CM

## 2014-06-03 DIAGNOSIS — D471 Chronic myeloproliferative disease: Secondary | ICD-10-CM

## 2014-06-03 DIAGNOSIS — D474 Osteomyelofibrosis: Secondary | ICD-10-CM

## 2014-06-03 DIAGNOSIS — IMO0001 Reserved for inherently not codable concepts without codable children: Secondary | ICD-10-CM

## 2014-06-03 DIAGNOSIS — D47Z9 Other specified neoplasms of uncertain behavior of lymphoid, hematopoietic and related tissue: Secondary | ICD-10-CM

## 2014-06-03 DIAGNOSIS — R161 Splenomegaly, not elsewhere classified: Secondary | ICD-10-CM

## 2014-06-03 DIAGNOSIS — R69 Illness, unspecified: Secondary | ICD-10-CM

## 2014-06-03 LAB — CBC WITH DIFFERENTIAL/PLATELET
Basophils Absolute: 0 10*3/uL (ref 0.0–0.1)
Basophils Relative: 0 % (ref 0–1)
Eosinophils Absolute: 0 10*3/uL (ref 0.0–0.7)
Eosinophils Relative: 1 % (ref 0–5)
HEMATOCRIT: 34.1 % — AB (ref 39.0–52.0)
HEMOGLOBIN: 11 g/dL — AB (ref 13.0–17.0)
LYMPHS PCT: 10 % — AB (ref 12–46)
Lymphs Abs: 0.5 10*3/uL — ABNORMAL LOW (ref 0.7–4.0)
MCH: 26.1 pg (ref 26.0–34.0)
MCHC: 32.3 g/dL (ref 30.0–36.0)
MCV: 80.8 fL (ref 78.0–100.0)
MONOS PCT: 4 % (ref 3–12)
Monocytes Absolute: 0.2 10*3/uL (ref 0.1–1.0)
NEUTROS ABS: 4.1 10*3/uL (ref 1.7–7.7)
NEUTROS PCT: 85 % — AB (ref 43–77)
Platelets: 104 10*3/uL — ABNORMAL LOW (ref 150–400)
RBC: 4.22 MIL/uL (ref 4.22–5.81)
RDW: 16.5 % — ABNORMAL HIGH (ref 11.5–15.5)
WBC: 4.8 10*3/uL (ref 4.0–10.5)

## 2014-06-03 NOTE — Telephone Encounter (Signed)
Pt called - past 1.5 weeks feeling weak. Tried to ask questions to pt  - unable to answer. Talked to Dr Beryle Beams suggest to do stat CBC with diff. Pt is aware to come today at 1:30PM for labs and will wait on results. Pt states he has transportation. Hilda Blades Janette Harvie RN 06/03/14 11:30AM

## 2014-06-03 NOTE — Progress Notes (Unsigned)
Patient ID: Sean Walter, male   DOB: [birth year], 43 y.o.   MRN: [id number] Work in visit for this 43 year old man who is on oral JAKOFI since February 2012 for a non-BCR-ABL myeloproliferative disorder with associated symptomatic splenomegaly. He complains of a five-day history of malaise, generalized myalgias, and loose bowel movements. He had a chill but did not take his temperature. Nausea but no vomiting. No cough or sore throat. No dysuria or frequency. No exposures.  Blood pressure 139/86, pulse 88 regular, temperature 97.9 Pharynx no erythema or exudate Lungs clear and resonant to percussion Abdomen soft and nontender Extremities no edema Neurologic grossly normal  Hemoglobin 11 down from 12.8, white count 4800 down from 8600, platelets 104,000 down from a 03/31/1999 compare with blood counts done June 22.  Impression: Viral syndrome Decrease in his counts compare with his most recent baseline. This could be transient bone marrow suppression from a viral illness, versus cumulative myelotoxicity from maximum dose JAKOFI, versus progression of his bone marrow disorder.  Plan: I advised him to hold the [place] for one week until he is feeling better. We will reassess blood counts on August 21. I will likely make a dose reduction at that time from current dose of 25 mg twice daily down to 15 mg twice daily. This will require a new prescription.

## 2014-06-04 NOTE — Progress Notes (Signed)
Patient ID: Sean Walter, male   DOB: 1970-11-29, 43 y.o.   MRN: [id number] A user error has taken place: encounter opened in error, closed for administrative reasons.

## 2014-06-08 ENCOUNTER — Other Ambulatory Visit: Payer: Self-pay | Admitting: *Deleted

## 2014-06-08 DIAGNOSIS — D474 Osteomyelofibrosis: Secondary | ICD-10-CM

## 2014-06-08 DIAGNOSIS — R161 Splenomegaly, not elsewhere classified: Secondary | ICD-10-CM

## 2014-06-08 DIAGNOSIS — C946 Myelodysplastic disease, not classified: Secondary | ICD-10-CM

## 2014-06-08 DIAGNOSIS — D471 Chronic myeloproliferative disease: Secondary | ICD-10-CM

## 2014-06-08 DIAGNOSIS — D47Z9 Other specified neoplasms of uncertain behavior of lymphoid, hematopoietic and related tissue: Secondary | ICD-10-CM

## 2014-06-10 ENCOUNTER — Other Ambulatory Visit: Payer: Self-pay | Admitting: *Deleted

## 2014-06-10 DIAGNOSIS — D471 Chronic myeloproliferative disease: Secondary | ICD-10-CM

## 2014-06-10 DIAGNOSIS — R161 Splenomegaly, not elsewhere classified: Secondary | ICD-10-CM

## 2014-06-10 DIAGNOSIS — C946 Myelodysplastic disease, not classified: Secondary | ICD-10-CM

## 2014-06-10 DIAGNOSIS — D474 Osteomyelofibrosis: Secondary | ICD-10-CM

## 2014-06-10 DIAGNOSIS — D47Z9 Other specified neoplasms of uncertain behavior of lymphoid, hematopoietic and related tissue: Secondary | ICD-10-CM

## 2014-06-10 MED ORDER — HYDROCODONE-ACETAMINOPHEN 5-325 MG PO TABS: ORAL_TABLET | ORAL | Status: DC

## 2014-06-10 NOTE — Telephone Encounter (Signed)
Pt picked up Rx 06/10/14 9AM.

## 2014-06-12 ENCOUNTER — Other Ambulatory Visit (INDEPENDENT_AMBULATORY_CARE_PROVIDER_SITE_OTHER): Payer: BC Managed Care – PPO

## 2014-06-12 DIAGNOSIS — R161 Splenomegaly, not elsewhere classified: Secondary | ICD-10-CM

## 2014-06-12 DIAGNOSIS — D471 Chronic myeloproliferative disease: Secondary | ICD-10-CM

## 2014-06-12 DIAGNOSIS — D47Z9 Other specified neoplasms of uncertain behavior of lymphoid, hematopoietic and related tissue: Secondary | ICD-10-CM

## 2014-06-12 DIAGNOSIS — D474 Osteomyelofibrosis: Secondary | ICD-10-CM

## 2014-06-12 DIAGNOSIS — C946 Myelodysplastic disease, not classified: Secondary | ICD-10-CM

## 2014-06-13 LAB — CBC WITH DIFFERENTIAL/PLATELET
Basophils Absolute: 0.1 10*3/uL (ref 0.0–0.1)
Basophils Relative: 1 % (ref 0–1)
EOS PCT: 1 % (ref 0–5)
Eosinophils Absolute: 0.1 10*3/uL (ref 0.0–0.7)
HEMATOCRIT: 33.2 % — AB (ref 39.0–52.0)
HEMOGLOBIN: 10.8 g/dL — AB (ref 13.0–17.0)
LYMPHS ABS: 0.5 10*3/uL — AB (ref 0.7–4.0)
LYMPHS PCT: 10 % — AB (ref 12–46)
MCH: 25.5 pg — ABNORMAL LOW (ref 26.0–34.0)
MCHC: 32.5 g/dL (ref 30.0–36.0)
MCV: 78.3 fL (ref 78.0–100.0)
MONO ABS: 0.2 10*3/uL (ref 0.1–1.0)
Monocytes Relative: 4 % (ref 3–12)
NEUTROS ABS: 4.3 10*3/uL (ref 1.7–7.7)
Neutrophils Relative %: 84 % — ABNORMAL HIGH (ref 43–77)
Platelets: 204 10*3/uL (ref 150–400)
RBC: 4.24 MIL/uL (ref 4.22–5.81)
RDW: 17.7 % — ABNORMAL HIGH (ref 11.5–15.5)
WBC: 5.1 10*3/uL (ref 4.0–10.5)

## 2014-06-13 LAB — COMPREHENSIVE METABOLIC PANEL
ALT: 23 U/L (ref 0–53)
AST: 18 U/L (ref 0–37)
Albumin: 4.3 g/dL (ref 3.5–5.2)
Alkaline Phosphatase: 68 U/L (ref 39–117)
BUN: 8 mg/dL (ref 6–23)
CALCIUM: 8.8 mg/dL (ref 8.4–10.5)
CHLORIDE: 101 meq/L (ref 96–112)
CO2: 27 meq/L (ref 19–32)
Creat: 1.28 mg/dL (ref 0.50–1.35)
Glucose, Bld: 98 mg/dL (ref 70–99)
Potassium: 4.1 mEq/L (ref 3.5–5.3)
Sodium: 138 mEq/L (ref 135–145)
Total Bilirubin: 0.5 mg/dL (ref 0.2–1.2)
Total Protein: 7.3 g/dL (ref 6.0–8.3)

## 2014-06-13 LAB — LACTATE DEHYDROGENASE: LDH: 346 U/L — AB (ref 94–250)

## 2014-06-13 LAB — URIC ACID: URIC ACID, SERUM: 7.8 mg/dL (ref 4.0–7.8)

## 2014-06-14 ENCOUNTER — Other Ambulatory Visit: Payer: Self-pay | Admitting: Oncology

## 2014-06-14 DIAGNOSIS — D474 Osteomyelofibrosis: Secondary | ICD-10-CM

## 2014-06-14 MED ORDER — RUXOLITINIB PHOSPHATE 25 MG PO TABS: ORAL_TABLET | ORAL | Status: DC

## 2014-06-15 ENCOUNTER — Telehealth: Payer: Self-pay | Admitting: *Deleted

## 2014-06-15 NOTE — Telephone Encounter (Signed)
Pt informed his counts are better per Dr Beryle Beams. "Call pt: blood counts improved. I will need to send in new Rx for lower dose of Jakofi". I talked to [place] at the [facility].  Rx for Jakofi 10 mg 2 tabs PO BID #[id number] was faxed to [facility] [phone]); pt informed.

## 2014-06-17 ENCOUNTER — Encounter: Payer: BC Managed Care – PPO | Admitting: Internal Medicine

## 2014-06-19 ENCOUNTER — Emergency Department (HOSPITAL_COMMUNITY)
Admission: EM | Admit: 2014-06-19 | Discharge: 2014-06-19 | Disposition: A | Discharge status: Home / Self Care | Payer: BC Managed Care – PPO | Attending: Emergency Medicine | Admitting: Emergency Medicine

## 2014-06-19 ENCOUNTER — Encounter (HOSPITAL_COMMUNITY): Payer: Self-pay | Admitting: Emergency Medicine

## 2014-06-19 DIAGNOSIS — Z79899 Other long term (current) drug therapy: Secondary | ICD-10-CM | POA: Insufficient documentation

## 2014-06-19 DIAGNOSIS — Z862 Personal history of diseases of the blood and blood-forming organs and certain disorders involving the immune mechanism: Secondary | ICD-10-CM | POA: Diagnosis not present

## 2014-06-19 DIAGNOSIS — L989 Disorder of the skin and subcutaneous tissue, unspecified: Secondary | ICD-10-CM | POA: Insufficient documentation

## 2014-06-19 DIAGNOSIS — Z87898 Personal history of other specified conditions: Secondary | ICD-10-CM | POA: Insufficient documentation

## 2014-06-19 DIAGNOSIS — J45909 Unspecified asthma, uncomplicated: Secondary | ICD-10-CM | POA: Diagnosis not present

## 2014-06-19 DIAGNOSIS — Z8619 Personal history of other infectious and parasitic diseases: Secondary | ICD-10-CM | POA: Diagnosis not present

## 2014-06-19 DIAGNOSIS — F17200 Nicotine dependence, unspecified, uncomplicated: Secondary | ICD-10-CM | POA: Diagnosis not present

## 2014-06-19 DIAGNOSIS — L678 Other hair color and hair shaft abnormalities: Secondary | ICD-10-CM | POA: Insufficient documentation

## 2014-06-19 DIAGNOSIS — L739 Follicular disorder, unspecified: Secondary | ICD-10-CM

## 2014-06-19 DIAGNOSIS — L738 Other specified follicular disorders: Secondary | ICD-10-CM | POA: Diagnosis not present

## 2014-06-19 LAB — COMPREHENSIVE METABOLIC PANEL
ALT: 25 U/L (ref 0–53)
ANION GAP: 13 (ref 5–15)
AST: 19 U/L (ref 0–37)
Albumin: 3.4 g/dL — ABNORMAL LOW (ref 3.5–5.2)
Alkaline Phosphatase: 81 U/L (ref 39–117)
BILIRUBIN TOTAL: 0.5 mg/dL (ref 0.3–1.2)
BUN: 6 mg/dL (ref 6–23)
CHLORIDE: 101 meq/L (ref 96–112)
CO2: 24 meq/L (ref 19–32)
CREATININE: 1.26 mg/dL (ref 0.50–1.35)
Calcium: 9 mg/dL (ref 8.4–10.5)
GFR calc Af Amer: 80 mL/min — ABNORMAL LOW (ref >90)
GFR, EST NON AFRICAN AMERICAN: 69 mL/min — AB (ref >90)
GLUCOSE: 93 mg/dL (ref 70–99)
Potassium: 3.9 mEq/L (ref 3.7–5.3)
Sodium: 138 mEq/L (ref 137–147)
Total Protein: 7.5 g/dL (ref 6.0–8.3)

## 2014-06-19 LAB — CBC WITH DIFFERENTIAL/PLATELET
Basophils Absolute: 0 10*3/uL (ref 0.0–0.1)
Basophils Relative: 1 % (ref 0–1)
EOS PCT: 1 % (ref 0–5)
Eosinophils Absolute: 0.1 10*3/uL (ref 0.0–0.7)
HEMATOCRIT: 37.9 % — AB (ref 39.0–52.0)
Hemoglobin: 12.1 g/dL — ABNORMAL LOW (ref 13.0–17.0)
LYMPHS ABS: 0.4 10*3/uL — AB (ref 0.7–4.0)
Lymphocytes Relative: 7 % — ABNORMAL LOW (ref 12–46)
MCH: 25.7 pg — AB (ref 26.0–34.0)
MCHC: 31.9 g/dL (ref 30.0–36.0)
MCV: 80.5 fL (ref 78.0–100.0)
MONO ABS: 0.3 10*3/uL (ref 0.1–1.0)
MONOS PCT: 4 % (ref 3–12)
Neutro Abs: 5.3 10*3/uL (ref 1.7–7.7)
Neutrophils Relative %: 88 % — ABNORMAL HIGH (ref 43–77)
PLATELETS: 180 10*3/uL (ref 150–400)
RBC: 4.71 MIL/uL (ref 4.22–5.81)
RDW: 17.5 % — ABNORMAL HIGH (ref 11.5–15.5)
WBC: 6 10*3/uL (ref 4.0–10.5)

## 2014-06-19 MED ORDER — CEPHALEXIN 500 MG PO CAPS: 500.0000 mg | ORAL_CAPSULE | Freq: Four times a day (QID) | ORAL | Status: AC

## 2014-06-19 MED ORDER — SULFAMETHOXAZOLE-TMP DS 800-160 MG PO TABS: 1.0000 | ORAL_TABLET | Freq: Two times a day (BID) | ORAL | Status: AC

## 2014-06-19 NOTE — ED Notes (Signed)
Pt reports having an ingrown hair and abscess to left groin for extended amount of time, denies any fever/chills. No acute distress noted at triage.

## 2014-06-19 NOTE — ED Provider Notes (Signed)
I saw and evaluated the patient, reviewed the resident's note and I agree with the findings and plan. If applicable, I agree with the resident's interpretation of the EKG.  If applicable, I was present for critical portions of any procedures performed.  Hx myeloproliferative disorder, on jakafi presenting with indurated lesion to L groin.  Hx similar lesions with shaving.  No fever or vomiting.  Appears well. Induration on exam without cellulitis or fluctuance. No abscess. Treat with abx.  Advised to avoid shaving as well as not drain lesions himself.  Sean Essex, MD 06/19/14 Lurline Hare

## 2014-06-19 NOTE — Discharge Instructions (Signed)
We have prescribed an antibiotic for your folliculitis. We have also done a GC test as you requested and will contact you if the results are positive.

## 2014-06-19 NOTE — ED Provider Notes (Signed)
CSN: [id number]     Arrival date & time 06/19/14  1148 History   First MD Initiated Contact with Patient 06/19/14 1158     Chief Complaint  Patient presents with  . Abscess   (Consider location/radiation/quality/duration/timing/severity/associated sxs/prior Treatment) HPI  Patient is a 43 year old gentleman with a history of myeloproliferative disorder and asthma who presents with a small raised lesion in his left inguinal area. He states that he has been periodically having these lesions throughout the last several months, especially when he shaves his pubic hair. He states that there is some mild pain in secondary to friction when he walks. He recently had a lesion on his right inguinal area that he incised with a needle that drained on the serosanguineous fluid. In the past, he states that these lesions have resolved with antibiotic therapy. He feels like his current lesion is harder and not fluctuant. Otherwise, he is also requesting a STI test, but denies any dysuria or other symptoms. Patient also denies any fevers, chills, chest pain, dyspnea, abdominal pain, constipation, and diarrhea.  Past Medical History  Diagnosis Date  . Asthma, cold induced   . Herpes   . Asthma   . No pertinent past medical history     enlarged spleen  . Spleen enlargement     myofibrosis need bonemarrow transplant  . Myelofibrosis     followed by Dr. Sherryl Walter  . 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 Mandible fracture surgery    . Mandibular hardware removal  04/10/2012    Procedure: MANDIBULAR HARDWARE REMOVAL;  Surgeon: Sean Marble, MD;  Location: [facility];  Service: ENT;  Laterality: Right;   History reviewed. No pertinent family history. History  Substance Use Topics  . Smoking status: Current Some Day Smoker -- 0.20 packs/day    Types: Cigarettes  .  Smokeless tobacco: Not on file  . Alcohol Use: Yes     Comment: daily     Review of Systems GU: Left inguinal lesion All other systems otherwise normal.  Allergies  Review of patient's allergies indicates no known allergies.  Home Medications   Prior to Admission medications   Medication Sig Start Date End Date Taking? Authorizing Provider  albuterol (PROVENTIL HFA;VENTOLIN HFA) 108 (90 BASE) MCG/ACT inhaler Inhale 2 puffs into the lungs every 6 (six) hours as needed for wheezing or shortness of breath.    Yes Historical Provider, MD  HYDROcodone-acetaminophen (NORCO/VICODIN) 5-325 MG per tablet Take 2 tablets by mouth 2 (two) times daily as needed for moderate pain.   Yes Historical Provider, MD  cephALEXin (KEFLEX) 500 MG capsule Take 1 capsule (500 mg total) by mouth 4 (four) times daily. 06/19/14 06/26/14  Sean Moore, MD  sulfamethoxazole-trimethoprim (BACTRIM DS) 800-160 MG per tablet Take 1 tablet by mouth 2 (two) times daily. 06/19/14 06/26/14  Sean Moore, MD   BP 118/73  Pulse 78  Temp(Src) 98.2 F (36.8 C) (Oral)  Resp 16  Ht 6\' 1"  (1.854 m)  Wt 210 lb (95.255 kg)  BMI 27.71 kg/m2  SpO2 98%  Physical Exam General: resting in bed HEENT: PERRL, EOMI, no scleral icterus, no lymphadenopathy Cardiac: RRR, no rubs, murmurs or gallops Pulm: clear to auscultation bilaterally, moving normal volumes of air Abd: soft, nontender, nondistended, BS present Ext: warm and well perfused, no pedal edema Neuro: alert and oriented X3, cranial nerves II-XII grossly intact GU: In  the left inguinal area, a 1 cm skin colored lesion that is mildly raised and indurated with a central hair follicle, no fluctuance appreciated; no surrounding lymphadenopathy appreciated  ED Course  Procedures (including critical care time) Labs Review Labs Reviewed  CBC WITH DIFFERENTIAL - Abnormal; Notable for the following:    Hemoglobin 12.1 (*)    HCT 37.9 (*)    MCH 25.7 (*)    RDW 17.5 (*)     Neutrophils Relative % 88 (*)    Lymphocytes Relative 7 (*)    Lymphs Abs 0.4 (*)    All other components within normal limits  COMPREHENSIVE METABOLIC PANEL - Abnormal; Notable for the following:    Albumin 3.4 (*)    GFR calc non Af Amer 69 (*)    GFR calc Af Amer 80 (*)    All other components within normal limits  GC/CHLAMYDIA PROBE AMP  HIV ANTIBODY (ROUTINE TESTING)    Imaging Review No results found.   EKG Interpretation None      MDM   Final diagnoses:  Folliculitis   Sean Walter is a role gentleman with a history of myeloproliferative disorder and asthma who presents with a lesion in his left inguinal area that is consistent with folliculitis. Vital signs within normal limits. Due to the size and lack of fluctuance of the lesion, there is no indication for incision and drainage. There is no evidence of lymphadenopathy.  - CBC stable from previous, CMP unremarkable  - Gonorrhea and chlamydia swab, HIV  - Given recent attempts at drainage by the patient, will cover patient with Bactrim and Keflex.    Sean Moore, MD 06/19/14 1538

## 2014-06-20 LAB — GC/CHLAMYDIA PROBE AMP
CT PROBE, AMP APTIMA: NEGATIVE
GC Probe RNA: NEGATIVE

## 2014-06-20 LAB — HIV ANTIBODY (ROUTINE TESTING W REFLEX): HIV 1&2 Ab, 4th Generation: NONREACTIVE

## 2014-07-09 ENCOUNTER — Other Ambulatory Visit: Payer: Self-pay | Admitting: Oncology

## 2014-07-09 MED ORDER — OXYCODONE-ACETAMINOPHEN 5-325 MG PO TABS: 1.0000 | ORAL_TABLET | Freq: Four times a day (QID) | ORAL | Status: DC | PRN

## 2014-07-10 ENCOUNTER — Ambulatory Visit (HOSPITAL_COMMUNITY): Payer: BC Managed Care – PPO

## 2014-07-10 ENCOUNTER — Telehealth: Payer: Self-pay | Admitting: *Deleted

## 2014-07-10 ENCOUNTER — Ambulatory Visit (HOSPITAL_COMMUNITY)
Admission: RE | Admit: 2014-07-10 | Discharge: 2014-07-10 | Disposition: A | Discharge status: Home / Self Care | Payer: BC Managed Care – PPO | Source: Ambulatory Visit | Attending: Oncology | Admitting: Oncology

## 2014-07-10 DIAGNOSIS — D739 Disease of spleen, unspecified: Secondary | ICD-10-CM | POA: Diagnosis not present

## 2014-07-10 DIAGNOSIS — R161 Splenomegaly, not elsewhere classified: Secondary | ICD-10-CM

## 2014-07-10 DIAGNOSIS — K7689 Other specified diseases of liver: Secondary | ICD-10-CM | POA: Insufficient documentation

## 2014-07-10 DIAGNOSIS — D47Z9 Other specified neoplasms of uncertain behavior of lymphoid, hematopoietic and related tissue: Secondary | ICD-10-CM | POA: Diagnosis not present

## 2014-07-10 DIAGNOSIS — D474 Osteomyelofibrosis: Secondary | ICD-10-CM

## 2014-07-10 DIAGNOSIS — C946 Myelodysplastic disease, not classified: Secondary | ICD-10-CM

## 2014-07-10 DIAGNOSIS — D471 Chronic myeloproliferative disease: Secondary | ICD-10-CM

## 2014-07-10 NOTE — Telephone Encounter (Signed)
Pt had called for refill on Hydrocodone. Called pt to let him know rx for Percocet was ready for pick up - changed of medication per Dr Beryle Beams.

## 2014-07-13 ENCOUNTER — Encounter: Payer: Self-pay | Admitting: *Deleted

## 2014-07-13 ENCOUNTER — Ambulatory Visit (INDEPENDENT_AMBULATORY_CARE_PROVIDER_SITE_OTHER): Payer: BC Managed Care – PPO | Admitting: Oncology

## 2014-07-13 ENCOUNTER — Encounter: Payer: Self-pay | Admitting: Oncology

## 2014-07-13 ENCOUNTER — Telehealth: Payer: Self-pay | Admitting: *Deleted

## 2014-07-13 VITALS — BP 133/76 | HR 61 | Temp 98.2°F | Wt 221.1 lb

## 2014-07-13 DIAGNOSIS — D474 Osteomyelofibrosis: Secondary | ICD-10-CM

## 2014-07-13 DIAGNOSIS — D471 Chronic myeloproliferative disease: Secondary | ICD-10-CM | POA: Diagnosis not present

## 2014-07-13 DIAGNOSIS — D47Z9 Other specified neoplasms of uncertain behavior of lymphoid, hematopoietic and related tissue: Secondary | ICD-10-CM | POA: Diagnosis not present

## 2014-07-13 LAB — CBC WITH DIFFERENTIAL/PLATELET
BASOS ABS: 0.1 10*3/uL (ref 0.0–0.1)
Basophils Relative: 1 % (ref 0–1)
Eosinophils Absolute: 0.1 10*3/uL (ref 0.0–0.7)
Eosinophils Relative: 2 % (ref 0–5)
HCT: 41.7 % (ref 39.0–52.0)
Hemoglobin: 14 g/dL (ref 13.0–17.0)
LYMPHS PCT: 14 % (ref 12–46)
Lymphs Abs: 0.9 10*3/uL (ref 0.7–4.0)
MCH: 25.8 pg — ABNORMAL LOW (ref 26.0–34.0)
MCHC: 33.6 g/dL (ref 30.0–36.0)
MCV: 76.9 fL — ABNORMAL LOW (ref 78.0–100.0)
Monocytes Absolute: 0.3 10*3/uL (ref 0.1–1.0)
Monocytes Relative: 5 % (ref 3–12)
Neutro Abs: 5 10*3/uL (ref 1.7–7.7)
Neutrophils Relative %: 78 % — ABNORMAL HIGH (ref 43–77)
PLATELETS: 224 10*3/uL (ref 150–400)
RBC: 5.42 MIL/uL (ref 4.22–5.81)
RDW: 16.9 % — AB (ref 11.5–15.5)
WBC: 6.4 10*3/uL (ref 4.0–10.5)

## 2014-07-13 MED ORDER — HYDROCODONE-ACETAMINOPHEN 5-325 MG PO TABS: 1.0000 | ORAL_TABLET | Freq: Four times a day (QID) | ORAL | Status: DC | PRN

## 2014-07-13 NOTE — Telephone Encounter (Addendum)
Received call from pharmacy [facility]) at [phone]-concerned that pt filled rx for Percocet #75 on 09/18 and presented a rx today for Norco #75. Discussed with DrGranfortuna, percocet rx on 09/18 was written in error.  [place] with MD to have Norco rx filled.  Pharmacy was contacted and made aware.  Pt was also contacted to bring in percocet pills, but states he has already disposed of them.Despina Hidden Cassady9/21/201512:06 PM

## 2014-07-13 NOTE — Patient Instructions (Signed)
Lab today then resume Q month standing orders beginning on 9/28 for CBC, diff, Cmet, LDH, uric acid MD visit 3 months

## 2014-07-13 NOTE — Progress Notes (Signed)
Patient ID: Sean Walter, male   DOB: April 21, 1971, 43 y.o.   MRN: [id number] Hematology and Oncology Follow Up Visit  Sean Walter [id number] [birth year] 43 y.o. 07/13/2014 6:05 PM   Principle Diagnosis: Encounter Diagnosis  Name Primary?  . Primary myelofibrosis Yes     Interim History:   Short interim followup visit for this 43 year old man under treatment for a myeloproliferative disorder with early myelofibrosis. He has had a nice response to [place]. At time of his most recent visit despite significant shrinkage in his spleen on the drug, he had developed mild thrombocytopenia with platelet count 204,000 on August 12. He was on a dose of 25 mg twice daily. I had him hold the drug to assess whether this was drug effect or disease effect. Fortunately it turned out to be drug effect. He had a nice rebound with platelet count 204,002 weeks later on August 21. He was advised to resume the drug at a 20% decrease dose total dose 20 mg twice daily. He continues to have intermittent left upper quadrant abdominal pain which usually starts late in the day when he is at work and lasts for about an hour. Pain can cause him to double up at times. I inadvertently gave him a prescription for Percocet instead of his usual Vicodin. He felt that the oxycodone makes him feel dysphoric and asked if I could rewrite the hydrocodone preparation. We talked at our last meeting about trying to taper him down from his chronic use of this medication and he was in agreement with a slow taper. He has had no other interim problems. He reports that he is still trying to line up various family members and friends who would be able to support him if he had to have a bone marrow transplant. He tells me that his wife is pregnant with their fifth child. He has 4 sons and is hoping for a girl.  Medications: reviewed  Allergies: No Known Allergies  Review of Systems: Hematology:  No bleeding or bruising ENT ROS: No sore  throat Breast ROS:  Respiratory ROS: No cough or dyspnea Cardiovascular ROS:  No chest pain or palpitations Gastrointestinal ROS: See above   Genito-Urinary ROS: Not questioned Musculoskeletal ROS: No muscle bone or joint pain Neurological ROS: No headache or change in vision Dermatological ROS: No new rash. Remaining ROS negative:   Physical Exam: Blood pressure 133/76, pulse 61, temperature 98.2 F (36.8 C), temperature source Oral, weight 221 lb 1.6 oz (100.29 kg), SpO2 100.00%. Wt Readings from Last 3 Encounters:  07/13/14 221 lb 1.6 oz (100.29 kg)  06/19/14 210 lb (95.255 kg)  04/13/14 223 lb 3.2 oz (101.243 kg)     General appearance:  HENNT: Pharynx no erythema, exudate, mass, or ulcer. No thyromegaly or thyroid nodules Lymph nodes: No cervical, supraclavicular, or axillary lymphadenopathy Breasts:  Lungs: Clear to auscultation, resonant to percussion throughout Heart: Regular rhythm, no murmur, no gallop, no rub, no click, no edema Abdomen: Soft, nontender, normal bowel sounds, no mass, no organomegaly: I am unable to palpate his spleen Extremities: No edema, no calf tenderness Musculoskeletal: no joint deformities GU:  Vascular: Carotid pulses 2+, no bruits, distal pulses: Dorsalis pedis 1+ symmetric Neurologic: Alert, oriented, PERRLA, optic discs sharp and vessels normal, no hemorrhage or exudate, cranial nerves grossly normal, motor strength 5 over 5, reflexes 1+ symmetric, upper body coordination normal, gait normal, Skin: Chronic condyloma acuminata covering his neck ; no ecchymosis; multiple skin tattoos on arms and trunk  Lab Results: CBC W/Diff    Component Value Date/Time   WBC 6.0 06/19/2014 1231   WBC 7.1 01/05/2014 1035   RBC 4.71 06/19/2014 1231   RBC 4.69 01/05/2014 1035   HGB 12.1* 06/19/2014 1231   HGB 12.0* 01/05/2014 1035   HCT 37.9* 06/19/2014 1231   HCT 37.1* 01/05/2014 1035   PLT 180 06/19/2014 1231   PLT 134* 01/05/2014 1035   MCV 80.5 06/19/2014  1231   MCV 79.1* 01/05/2014 1035   MCH 25.7* 06/19/2014 1231   MCH 25.6* 01/05/2014 1035   MCHC 31.9 06/19/2014 1231   MCHC 32.3 01/05/2014 1035   RDW 17.5* 06/19/2014 1231   RDW 16.8* 01/05/2014 1035   LYMPHSABS 0.4* 06/19/2014 1231   LYMPHSABS 0.7* 01/05/2014 1035   MONOABS 0.3 06/19/2014 1231   MONOABS 0.3 01/05/2014 1035   EOSABS 0.1 06/19/2014 1231   EOSABS 0.1 01/05/2014 1035   BASOSABS 0.0 06/19/2014 1231   BASOSABS 0.1 01/05/2014 1035     Chemistry      Component Value Date/Time   NA 138 06/19/2014 1231   NA 141 01/05/2014 1035   K 3.9 06/19/2014 1231   K 3.9 01/05/2014 1035   CL 101 06/19/2014 1231   CL 106 03/07/2013 0826   CO2 24 06/19/2014 1231   CO2 24 01/05/2014 1035   BUN 6 06/19/2014 1231   BUN 11.3 01/05/2014 1035   CREATININE 1.26 06/19/2014 1231   CREATININE 1.28 06/12/2014 0931   CREATININE 1.8* 01/05/2014 1035      Component Value Date/Time   CALCIUM 9.0 06/19/2014 1231   CALCIUM 9.5 01/05/2014 1035   ALKPHOS 81 06/19/2014 1231   ALKPHOS 57 01/05/2014 1035   AST 19 06/19/2014 1231   AST 21 01/05/2014 1035   ALT 25 06/19/2014 1231   ALT 31 01/05/2014 1035   BILITOT 0.5 06/19/2014 1231   BILITOT 0.49 01/05/2014 1035       Radiological Studies: US Abdomen Complete  07/10/2014   CLINICAL DATA:  Splenomegaly.  Myeloproliferative disorder.  EXAM: ULTRASOUND ABDOMEN COMPLETE  COMPARISON:  Ultrasound exams dated 04/17/2014 and 04/24/2011 and CT scan dated 01/05/2014  FINDINGS: Gallbladder:  No gallstones or wall thickening visualized. No sonographic Murphy sign noted.  Common bile duct:  Diameter: 3.1 mm, normal.  Liver:  No focal lesions. Diffuse increased echogenicity consistent with hepatic steatosis.  IVC:  No abnormality visualized.  Pancreas:  Normal.  Spleen:  The spleen measures 15.9 cm in length. Volume is 620 cubic cm. This is essentially unchanged. There is a single 1.7 cm hyperechoic lesion in the inferior tip of the spleen, unchanged since ultrasound dated 04/24/2011.  Right  Kidney:  Length: 10.2 cm. Echogenicity within normal limits. No mass or hydronephrosis visualized.  Left Kidney:  Length: 10.0 cm. Echogenicity within normal limits. No mass or hydronephrosis visualized.  Abdominal aorta:  Normal.  2.4 cm maximal diameter.  Other findings:  None.  IMPRESSION: Stable splenomegaly with stable small lesion in the inferior tip of the spleen.  Hepatic steatosis.   Electronically Signed   By: Rozetta Nunnery M.D.   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 with recent minor dose reduction.  Plan continue the same  Recent report in the September 3 [place] with very interesting back-to-back articles looking at a new class of antineoplastic agents: Telomerase inhibitors which have shown significant activity in a pilot  study on patients with myelofibrosis and another cohort of  patients with essential thrombocythemia including some molecular remissions. This is very interesting and provocative for the future.  #2. Transient elevation of creatinine up to 1.8 now back to his baseline of 1.3 and likely normal for his muscle mass.  #3. 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  CC: Patient Care Team: Pcp Not In System as PCP - General   [name], MD 9/21/20156:05 PM

## 2014-07-16 ENCOUNTER — Telehealth: Payer: Self-pay | Admitting: *Deleted

## 2014-07-16 NOTE — Telephone Encounter (Signed)
Message copied by Ebbie Latus on Thu Jul 16, 2014 11:04 AM ------      Message from: [name]      Created: Sun Jul 12, 2014  2:24 PM       Call pt: spleen size stable compared with previous ultrasound ------

## 2014-07-16 NOTE — Telephone Encounter (Signed)
Pt called - message left blood count good and to stay on current dose of Jakafi 20mg  BID per Dr Beryle Beams.

## 2014-07-16 NOTE — Telephone Encounter (Signed)
Pt called and message left about his Abd U/S result - spleen size stable compared with previous one per Dr Beryle Beams.

## 2014-07-16 NOTE — Telephone Encounter (Signed)
Message copied by Ebbie Latus on Thu Jul 16, 2014 11:07 AM ------      Message from: Sean Walter      Created: Tue Jul 14, 2014  7:07 PM       Call pt: blood counts very good.  Stay on 20 mg BID Jakafi ------

## 2014-07-24 ENCOUNTER — Emergency Department (HOSPITAL_COMMUNITY)
Admission: EM | Admit: 2014-07-24 | Discharge: 2014-07-24 | Disposition: A | Discharge status: Home / Self Care | Payer: BC Managed Care – PPO | Attending: Emergency Medicine | Admitting: Emergency Medicine

## 2014-07-24 ENCOUNTER — Encounter (HOSPITAL_COMMUNITY): Payer: Self-pay | Admitting: Emergency Medicine

## 2014-07-24 DIAGNOSIS — R369 Urethral discharge, unspecified: Secondary | ICD-10-CM | POA: Insufficient documentation

## 2014-07-24 DIAGNOSIS — Z8619 Personal history of other infectious and parasitic diseases: Secondary | ICD-10-CM | POA: Insufficient documentation

## 2014-07-24 DIAGNOSIS — Z79899 Other long term (current) drug therapy: Secondary | ICD-10-CM | POA: Insufficient documentation

## 2014-07-24 DIAGNOSIS — Z720 Tobacco use: Secondary | ICD-10-CM | POA: Diagnosis not present

## 2014-07-24 DIAGNOSIS — Z862 Personal history of diseases of the blood and blood-forming organs and certain disorders involving the immune mechanism: Secondary | ICD-10-CM | POA: Insufficient documentation

## 2014-07-24 DIAGNOSIS — J45909 Unspecified asthma, uncomplicated: Secondary | ICD-10-CM | POA: Diagnosis not present

## 2014-07-24 DIAGNOSIS — Z113 Encounter for screening for infections with a predominantly sexual mode of transmission: Secondary | ICD-10-CM | POA: Insufficient documentation

## 2014-07-24 DIAGNOSIS — Z711 Person with feared health complaint in whom no diagnosis is made: Secondary | ICD-10-CM

## 2014-07-24 DIAGNOSIS — R300 Dysuria: Secondary | ICD-10-CM | POA: Insufficient documentation

## 2014-07-24 LAB — URINALYSIS, ROUTINE W REFLEX MICROSCOPIC
Bilirubin Urine: NEGATIVE
GLUCOSE, UA: NEGATIVE mg/dL
HGB URINE DIPSTICK: NEGATIVE
KETONES UR: NEGATIVE mg/dL
Nitrite: NEGATIVE
Protein, ur: NEGATIVE mg/dL
Specific Gravity, Urine: 1.005 (ref 1.005–1.030)
UROBILINOGEN UA: 0.2 mg/dL (ref 0.0–1.0)
pH: 5 (ref 5.0–8.0)

## 2014-07-24 LAB — URINE MICROSCOPIC-ADD ON

## 2014-07-24 LAB — RPR

## 2014-07-24 LAB — HIV ANTIBODY (ROUTINE TESTING W REFLEX): HIV 1&2 Ab, 4th Generation: NONREACTIVE

## 2014-07-24 MED ORDER — LIDOCAINE-EPINEPHRINE (PF) 2 %-1:200000 IJ SOLN
10.0000 mL | Freq: Once | INTRAMUSCULAR | Status: DC
  Filled 2014-07-24: qty 20

## 2014-07-24 MED ORDER — CEFTRIAXONE SODIUM 250 MG IJ SOLR
250.0000 mg | Freq: Once | INTRAMUSCULAR | Status: AC
  Administered 2014-07-24: 250 mg via INTRAMUSCULAR
  Filled 2014-07-24: qty 250

## 2014-07-24 MED ORDER — DOXYCYCLINE HYCLATE 100 MG PO CAPS: 100.0000 mg | ORAL_CAPSULE | Freq: Two times a day (BID) | ORAL | Status: DC

## 2014-07-24 MED ORDER — AZITHROMYCIN 250 MG PO TABS
1000.0000 mg | ORAL_TABLET | Freq: Once | ORAL | Status: AC
  Administered 2014-07-24: 1000 mg via ORAL
  Filled 2014-07-24: qty 4

## 2014-07-24 MED ORDER — PERMETHRIN 5 % EX CREA: TOPICAL_CREAM | CUTANEOUS | Status: DC

## 2014-07-24 NOTE — ED Provider Notes (Signed)
CSN: [id number]     Arrival date & time 07/24/14  1327 History   This chart was scribed for non-physician practitioner, Michele Mcalpine, PA-C, working with Artis Delay, MD by Ladene Artist, ED Scribe. This patient was seen in room TR09C/TR09C and the patient's care was started at 1:50 PM.     Chief Complaint  Patient presents with  . SEXUALLY TRANSMITTED DISEASE   The history is provided by the patient. No language interpreter was used.   HPI Comments: Sean Walter is a 43 y.o. male who presents to the Emergency Department for suspected STD exposure. Pt reports 2 recent sexual partners without protection. He reports associated dysuria onset 2-3 days ago. He denies fever, chills, nausea, vomiting, penile discharge.   Past Medical History  Diagnosis Date  . Asthma, cold induced   . Herpes   . Asthma   . No pertinent past medical history     enlarged spleen  . Spleen enlargement     myofibrosis need bonemarrow transplant  . Myelofibrosis     followed by Dr. Sherryl Manges  . 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 Mandible fracture surgery    . Mandibular hardware removal  04/10/2012    Procedure: MANDIBULAR HARDWARE REMOVAL;  Surgeon: Jodi Marble, MD;  Location: [facility];  Service: ENT;  Laterality: Right;   No family history on file. History  Substance Use Topics  . Smoking status: Current Some Day Smoker -- 0.20 packs/day    Types: Cigarettes  . Smokeless tobacco: Not on file  . Alcohol Use: Yes     Comment: daily     Review of Systems  Constitutional: Negative for fever and chills.  Gastrointestinal: Negative for nausea and vomiting.  Genitourinary: Positive for dysuria. Negative for discharge.  All other systems reviewed and are negative.  Allergies  Review of patient's allergies indicates no known allergies.  Home Medications    Prior to Admission medications   Medication Sig Start Date End Date Taking? Authorizing Provider  albuterol (PROVENTIL HFA;VENTOLIN HFA) 108 (90 BASE) MCG/ACT inhaler Inhale 2 puffs into the lungs every 6 (six) hours as needed for wheezing or shortness of breath.     Historical Provider, MD  doxycycline (VIBRAMYCIN) 100 MG capsule Take 1 capsule (100 mg total) by mouth 2 (two) times daily. One po bid x 7 days 07/24/14   Illene Labrador, PA-C  HYDROcodone-acetaminophen (NORCO/VICODIN) 5-325 MG per tablet Take 1-2 tablets by mouth every 6 (six) hours as needed for moderate pain. 07/13/14   [name], MD   Triage Vitals: BP 142/79  Pulse 76  Temp(Src) 97.8 F (36.6 C) (Oral)  Resp 18  SpO2 96% Physical Exam  Nursing note and vitals reviewed. Constitutional: He is oriented to person, place, and time. He appears well-developed and well-nourished. No distress.  HENT:  Head: Normocephalic and atraumatic.  Eyes: Conjunctivae and EOM are normal.  Neck: Normal range of motion. Neck supple.  Cardiovascular: Normal rate, regular rhythm and normal heart sounds.   Pulmonary/Chest: Effort normal and breath sounds normal.  Genitourinary: Discharge found.  Small amount of clear penile discharge   Musculoskeletal: Normal range of motion. He exhibits no edema.  Neurological: He is alert and oriented to person, place, and time.  Skin: Skin is warm and dry.  Psychiatric: He has a normal mood and affect. His behavior is normal.  ED Course  Procedures (including critical care time) DIAGNOSTIC STUDIES: Oxygen Saturation is 96% on RA, normal by my interpretation.    COORDINATION OF CARE: 1:54 PM-Discussed treatment plan which includes UA and diagnostic blood work with pt at bedside and pt agreed to plan.   Labs Review Labs Reviewed  URINALYSIS, ROUTINE W REFLEX MICROSCOPIC - Abnormal; Notable for the following:    Leukocytes, UA SMALL (*)    All other components within normal limits   GC/CHLAMYDIA PROBE AMP  URINE MICROSCOPIC-ADD ON  RPR  HIV ANTIBODY (ROUTINE TESTING)   Imaging Review No results found.   EKG Interpretation None      MDM   Final diagnoses:  Concern about STD in male without diagnosis   Patient treated prophylactically for GC/chlamydia with Rocephin and azithromycin. Discharged with doxycycline come urinalysis resulting with 7-10 white blood cells and small leukocytes. Urine culture, GC/chlamydia cultures pending. HIV, RPR pending. Infection care/precautions given. Stable for discharge. Return precautions given. Patient states understanding of treatment care plan and is agreeable.  I personally performed the services described in this documentation, which was scribed in my presence. The recorded information has been reviewed and is accurate.    Illene Labrador, PA-C 07/24/14 1425

## 2014-07-24 NOTE — Discharge Instructions (Signed)
Take antibiotic to completion. You were treated today for both gonorrhea and Chlamydia. If these tests results positive, you will be contacted and are obligated to inform your partner. Sexually Transmitted Disease A sexually transmitted disease (STD) is a disease or infection that may be passed (transmitted) from person to person, usually during sexual activity. This may happen by way of saliva, semen, blood, vaginal mucus, or urine. Common STDs include:   Gonorrhea.   Chlamydia.   Syphilis.   HIV and AIDS.   Genital herpes.   Hepatitis B and C.   Trichomonas.   Human papillomavirus (HPV).   Pubic lice.   Scabies.  Mites.  Bacterial vaginosis. WHAT ARE CAUSES OF STDs? An STD may be caused by bacteria, a virus, or parasites. STDs are often transmitted during sexual activity if one person is infected. However, they may also be transmitted through nonsexual means. STDs may be transmitted after:   Sexual intercourse with an infected person.   Sharing sex toys with an infected person.   Sharing needles with an infected person or using unclean piercing or tattoo needles.  Having intimate contact with the genitals, mouth, or rectal areas of an infected person.   Exposure to infected fluids during birth. WHAT ARE THE SIGNS AND SYMPTOMS OF STDs? Different STDs have different symptoms. Some people may not have any symptoms. If symptoms are present, they may include:   Painful or bloody urination.   Pain in the pelvis, abdomen, vagina, anus, throat, or eyes.   A skin rash, itching, or irritation.  Growths, ulcerations, blisters, or sores in the genital and anal areas.  Abnormal vaginal discharge with or without bad odor.   Penile discharge in men.   Fever.   Pain or bleeding during sexual intercourse.   Swollen glands in the groin area.   Yellow skin and eyes (jaundice). This is seen with hepatitis.   Swollen testicles.  Infertility.  Sores  and blisters in the mouth. HOW ARE STDs DIAGNOSED? To make a diagnosis, your health care provider may:   Take a medical history.   Perform a physical exam.   Take a sample of any discharge to examine.  Swab the throat, cervix, opening to the penis, rectum, or vagina for testing.  Test a sample of your first morning urine.   Perform blood tests.   Perform a Pap test, if this applies.   Perform a colposcopy.   Perform a laparoscopy.  HOW ARE STDs TREATED? Treatment depends on the STD. Some STDs may be treated but not cured.   Chlamydia, gonorrhea, trichomonas, and syphilis can be cured with antibiotic medicine.   Genital herpes, hepatitis, and HIV can be treated, but not cured, with prescribed medicines. The medicines lessen symptoms.   Genital warts from HPV can be treated with medicine or by freezing, burning (electrocautery), or surgery. Warts may come back.   HPV cannot be cured with medicine or surgery. However, abnormal areas may be removed from the cervix, vagina, or vulva.   If your diagnosis is confirmed, your recent sexual partners need treatment. This is true even if they are symptom-free or have a negative culture or evaluation. They should not have sex until their health care providers say it is okay. HOW CAN I REDUCE MY RISK OF GETTING AN STD? Take these steps to reduce your risk of getting an STD:  Use latex condoms, dental dams, and water-soluble lubricants during sexual activity. Do not use petroleum jelly or oils.  Avoid having multiple  sex partners.  Do not have sex with someone who has other sex partners.  Do not have sex with anyone you do not know or who is at high risk for an STD.  Avoid risky sex practices that can break your skin.  Do not have sex if you have open sores on your mouth or skin.  Avoid drinking too much alcohol or taking illegal drugs. Alcohol and drugs can affect your judgment and put you in a vulnerable  position.  Avoid engaging in oral and anal sex acts.  Get vaccinated for HPV and hepatitis. If you have not received these vaccines in the past, talk to your health care provider about whether one or both might be right for you.   If you are at risk of being infected with HIV, it is recommended that you take a prescription medicine daily to prevent HIV infection. This is called pre-exposure prophylaxis (PrEP). You are considered at risk if:  You are a man who has sex with other men (MSM).  You are a heterosexual man or woman and are sexually active with more than one partner.  You take drugs by injection.  You are sexually active with a partner who has HIV.  Talk with your health care provider about whether you are at high risk of being infected with HIV. If you choose to begin PrEP, you should first be tested for HIV. You should then be tested every 3 months for as long as you are taking PrEP.  WHAT SHOULD I DO IF I THINK I HAVE AN STD?  See your health care provider.   Tell your sexual partner(s). They should be tested and treated for any STDs.  Do not have sex until your health care provider says it is okay. WHEN SHOULD I GET IMMEDIATE MEDICAL CARE? Contact your health care provider right away if:   You have severe abdominal pain.  You are a man and notice swelling or pain in your testicles.  You are a woman and notice swelling or pain in your vagina. Document Released: 12/30/2002 Document Revised: 10/14/2013 Document Reviewed: 04/29/2013 [facility] Patient Information 2015 [place], [place]. This information is not intended to replace advice given to you by your health care provider. Make sure you discuss any questions you have with your health care provider.

## 2014-07-24 NOTE — ED Notes (Signed)
Pt here for STD check. Denies any symptoms, states "I just want to get checked." Pt reports multiple sexual partners, denies using protection.

## 2014-07-25 LAB — GC/CHLAMYDIA PROBE AMP
CT PROBE, AMP APTIMA: NEGATIVE
GC Probe RNA: NEGATIVE

## 2014-07-25 LAB — URINE CULTURE
Colony Count: NO GROWTH
Culture: NO GROWTH

## 2014-07-25 NOTE — ED Provider Notes (Signed)
Medical screening examination/treatment/procedure(s) were performed by non-physician practitioner and as supervising physician I was immediately available for consultation/collaboration.   Artis Delay, MD 07/25/14 [phone]

## 2014-08-04 ENCOUNTER — Encounter: Payer: Self-pay | Admitting: Internal Medicine

## 2014-08-04 ENCOUNTER — Ambulatory Visit (INDEPENDENT_AMBULATORY_CARE_PROVIDER_SITE_OTHER): Payer: BLUE CROSS/BLUE SHIELD | Admitting: Internal Medicine

## 2014-08-04 VITALS — BP 139/80 | HR 73 | Temp 98.2°F | Ht 73.0 in | Wt 223.7 lb

## 2014-08-04 DIAGNOSIS — Z23 Encounter for immunization: Secondary | ICD-10-CM

## 2014-08-04 DIAGNOSIS — D47Z9 Other specified neoplasms of uncertain behavior of lymphoid, hematopoietic and related tissue: Secondary | ICD-10-CM | POA: Diagnosis not present

## 2014-08-04 DIAGNOSIS — R300 Dysuria: Secondary | ICD-10-CM

## 2014-08-04 DIAGNOSIS — C946 Myelodysplastic disease, not classified: Secondary | ICD-10-CM

## 2014-08-04 NOTE — Assessment & Plan Note (Signed)
Symptoms stable. Has FMLA forms for me and to complete today. Will follow up with Dr Beryle Beams.

## 2014-08-04 NOTE — Patient Instructions (Addendum)
General Instructions: Please follow up here in 3 months   Please try to bring all your medicines next time. This will help [place] keep you safe from mistakes. Please bring your medicines with you each time you come.   Medicines may be  Eye drops  Herbal   Vitamins  Pills  Seeing these help [place] take care of you.   Progress Toward Treatment Goals:  No flowsheet data found.  Self Care Goals & Plans:  Self Care Goal 08/04/2014  Manage my medications take my medicines as prescribed; bring my medications to every visit; refill my medications on time  Eat healthy foods drink diet soda or water instead of juice or soda; eat more vegetables; eat foods that are low in salt; eat baked foods instead of fried foods; eat fruit for snacks and desserts    No flowsheet data found.   Care Management & Community Referrals:  No flowsheet data found.

## 2014-08-04 NOTE — Assessment & Plan Note (Signed)
Symptoms resolved since completing his treatment for the ED 07/24/2014. Received doxycycline and azithromycin. He was discharged on by mouth doxycycline which he has completed. Urine Chlamydia and gonorrhea - negative. HIV and RPR  - nonreactive. Discussed with the patient about safe sexual practices. Recommended consistent condom use. Plan -Followup as needed

## 2014-08-04 NOTE — Progress Notes (Signed)
Patient ID: Sean Walter, male   DOB: 01-23-1971, 43 y.o.   MRN: [id number]   Subjective:   HPI: Sean Walter is a 43 y.o. gentleman with past medical hx of myelodysplastic neoplasm.  Reason(s) for this visit: 1. ED follow up: He was evaluated in the emergency department on 07/24/2014 after an unprotected sexual encounter and he was concerned for STD. Patient had dysuria at that time. He was treated with Rocephin and azithromycin and discharged on by mouth doxycycline. His HIV, Chlamydia and gonorrhea test, have all resulted negative. He feels his symptoms have resolved. Discussed with the patient about safe sexual practices. He states that he recently obtained condoms and he plans to use them consistently. 2. Given tetanus shot and flu shot today   ROS: Constitutional: Denies fever, chills, diaphoresis, appetite change and fatigue.  Respiratory: Denies SOB, DOE, cough, chest tightness, and wheezing. Denies chest pain. CVS: No chest pain, palpitations and leg swelling.  GI: No abdominal pain, nausea, vomiting, bloody stools GU: No dysuria, frequency, hematuria, or flank pain.  MSK: No myalgias, back pain, joint swelling, arthralgias  Psych: No depression symptoms. No SI or SA.    Objective:  Physical Exam: Filed Vitals:   08/04/14 0824  BP: 139/80  Pulse: 73  Temp: 98.2 F (36.8 C)  Height: 6\' 1"  (1.854 m)  Weight: 223 lb 11.2 oz (101.47 kg)  SpO2: 99%   General: Well nourished. No acute distress.  HEENT: Normal oral mucosa. MMM.  Lungs: CTA bilaterally. Heart: RRR; no extra sounds or murmurs  Abdomen: Non-distended, normal bowel sounds, soft, nontender; no hepatosplenomegaly  Extremities: No pedal edema. No joint swelling or tenderness. Neurologic: Normal EOM,  Alert and oriented x3. No obvious neurologic/cranial nerve deficits.  Assessment & Plan:  Discussed case with my attending in the clinic, Dr. Daryll Drown See problem based charting.

## 2014-08-11 ENCOUNTER — Other Ambulatory Visit: Payer: Self-pay | Admitting: *Deleted

## 2014-08-11 MED ORDER — HYDROCODONE-ACETAMINOPHEN 5-325 MG PO TABS: 1.0000 | ORAL_TABLET | Freq: Four times a day (QID) | ORAL | Status: DC | PRN

## 2014-08-11 NOTE — Telephone Encounter (Signed)
Rx ready for pick up ; pt called. 

## 2014-08-15 NOTE — Progress Notes (Signed)
Internal Medicine Clinic Attending  Case discussed with Dr. Kazibwe soon after the resident saw the patient.  We reviewed the resident's history and exam and pertinent patient test results.  I agree with the assessment, diagnosis, and plan of care documented in the resident's note. 

## 2014-08-19 ENCOUNTER — Other Ambulatory Visit: Payer: BC Managed Care – PPO

## 2014-08-26 ENCOUNTER — Other Ambulatory Visit (INDEPENDENT_AMBULATORY_CARE_PROVIDER_SITE_OTHER): Payer: BLUE CROSS/BLUE SHIELD

## 2014-08-26 ENCOUNTER — Encounter: Payer: Self-pay | Admitting: Internal Medicine

## 2014-08-26 ENCOUNTER — Ambulatory Visit (INDEPENDENT_AMBULATORY_CARE_PROVIDER_SITE_OTHER): Payer: BLUE CROSS/BLUE SHIELD | Admitting: Internal Medicine

## 2014-08-26 VITALS — BP 132/71 | HR 78 | Temp 98.0°F | Ht 73.0 in | Wt 224.4 lb

## 2014-08-26 DIAGNOSIS — L02214 Cutaneous abscess of groin: Secondary | ICD-10-CM | POA: Insufficient documentation

## 2014-08-26 DIAGNOSIS — D471 Chronic myeloproliferative disease: Secondary | ICD-10-CM

## 2014-08-26 DIAGNOSIS — C946 Myelodysplastic disease, not classified: Secondary | ICD-10-CM

## 2014-08-26 DIAGNOSIS — R161 Splenomegaly, not elsewhere classified: Secondary | ICD-10-CM

## 2014-08-26 DIAGNOSIS — D47Z9 Other specified neoplasms of uncertain behavior of lymphoid, hematopoietic and related tissue: Secondary | ICD-10-CM

## 2014-08-26 LAB — CBC WITH DIFFERENTIAL/PLATELET
Basophils Absolute: 0 10*3/uL (ref 0.0–0.1)
Basophils Relative: 0 % (ref 0–1)
Eosinophils Absolute: 0 10*3/uL (ref 0.0–0.7)
Eosinophils Relative: 0 % (ref 0–5)
HCT: 33.6 % — ABNORMAL LOW (ref 39.0–52.0)
Hemoglobin: 11.1 g/dL — ABNORMAL LOW (ref 13.0–17.0)
Lymphocytes Relative: 12 % (ref 12–46)
Lymphs Abs: 0.6 10*3/uL — ABNORMAL LOW (ref 0.7–4.0)
MCH: 25.5 pg — ABNORMAL LOW (ref 26.0–34.0)
MCHC: 33 g/dL (ref 30.0–36.0)
MCV: 77.2 fL — ABNORMAL LOW (ref 78.0–100.0)
Monocytes Absolute: 0.2 10*3/uL (ref 0.1–1.0)
Monocytes Relative: 5 % (ref 3–12)
Neutro Abs: 4.1 10*3/uL (ref 1.7–7.7)
Neutrophils Relative %: 83 % — ABNORMAL HIGH (ref 43–77)
Platelets: 218 10*3/uL (ref 150–400)
RBC: 4.35 MIL/uL (ref 4.22–5.81)
RDW: 17.9 % — ABNORMAL HIGH (ref 11.5–15.5)
WBC: 4.9 10*3/uL (ref 4.0–10.5)

## 2014-08-26 LAB — COMPREHENSIVE METABOLIC PANEL
ALT: 31 U/L (ref 0–53)
AST: 22 U/L (ref 0–37)
Albumin: 4.2 g/dL (ref 3.5–5.2)
Alkaline Phosphatase: 63 U/L (ref 39–117)
BUN: 9 mg/dL (ref 6–23)
CO2: 28 mEq/L (ref 19–32)
Calcium: 8.8 mg/dL (ref 8.4–10.5)
Chloride: 102 mEq/L (ref 96–112)
Creat: 1.27 mg/dL (ref 0.50–1.35)
Glucose, Bld: 115 mg/dL — ABNORMAL HIGH (ref 70–99)
Potassium: 3.8 mEq/L (ref 3.5–5.3)
Sodium: 138 mEq/L (ref 135–145)
Total Bilirubin: 0.6 mg/dL (ref 0.2–1.2)
Total Protein: 7 g/dL (ref 6.0–8.3)

## 2014-08-26 LAB — URIC ACID: Uric Acid, Serum: 7.1 mg/dL (ref 4.0–7.8)

## 2014-08-26 LAB — LACTATE DEHYDROGENASE: LDH: 293 U/L — ABNORMAL HIGH (ref 94–250)

## 2014-08-26 MED ORDER — HYDROCODONE-ACETAMINOPHEN 5-325 MG PO TABS: 1.0000 | ORAL_TABLET | Freq: Four times a day (QID) | ORAL | Status: DC | PRN

## 2014-08-26 MED ORDER — DOXYCYCLINE HYCLATE 100 MG PO TABS: 100.0000 mg | ORAL_TABLET | Freq: Two times a day (BID) | ORAL | Status: DC

## 2014-08-26 NOTE — Progress Notes (Signed)
   Subjective:    Patient ID: Sean Walter, male    DOB: 15-Dec-1970, 43 y.o.   MRN: [id number]  HPI Comments: 43 y.o PMH JAK 2+ MDS with splenomegaly, primary myelofibrosis, HSV1 infection  He presents for acute visit 1. Groin pain on scrotum.  He shaved about 1.5 weeks ago in his private area and then noticed for the past 2 days he has had a hair bump that is very painful worse with movement (i.e. At work at [employer]).  He denies fever, chills, drainage or the fact that the lesion is red.  He had tried hot water and epsom salt soaks.  He denies other sexual partners having this lesion.  He has had abscesses in the groin region before x 4 and this episode is similar to previous.   2. Recently seen in ED for concern for STD but GC/CT and HIV and RPR negative      Review of Systems  Constitutional: Negative for fever and chills.  Genitourinary:       +scrotal lesion   Skin: Positive for wound.       Objective:   Physical Exam  Constitutional: He is oriented to person, place, and time. He appears well-developed and well-nourished. No distress.  HENT:  Head: Normocephalic and atraumatic.  Eyes: Conjunctivae are normal.  Cardiovascular: Normal rate and regular rhythm.   Pulmonary/Chest: Effort normal and breath sounds normal.  Genitourinary:  Left scrotum with 5 x 4 cm abscess with fluctuation no drainage.  ttp   Neurological: He is alert and oriented to person, place, and time.  Skin: Skin is warm and dry. He is not diaphoretic.  See GU section   Psychiatric: He has a normal mood and affect. His behavior is normal. Judgment and thought content normal.  Nursing note and vitals reviewed.         Assessment & Plan:  F/u 1-2 weeks if needed or as scheduled with Dr. Darnell Level 10/06/14

## 2014-08-26 NOTE — Patient Instructions (Addendum)
General Instructions:  Go to [address] 3rd floor near [place] and walk in Take antibiotic 100 mg 2 x per day x 10 days with food.   Please follow up in 1-2 weeks if needed Follow up with Dr. Cyndie Chime 10/05/14    Treatment Goals:  Goals (1 Years of Data) as of 08/26/14    None     Blood pressure was good today 132/71  Progress Toward Treatment Goals:  No flowsheet data found.  Self Care Goals & Plans:  Self Care Goal 08/26/2014  Manage my medications take my medicines as prescribed; bring my medications to every visit; refill my medications on time; follow the sick day instructions if I am sick  Eat healthy foods drink diet soda or water instead of juice or soda; eat more vegetables; eat foods that are low in salt; eat baked foods instead of fried foods; eat fruit for snacks and desserts; eat smaller portions  Be physically active find an activity I enjoy  Meeting treatment goals maintain the current self-care plan    No flowsheet data found.   Care Management & Community Referrals:  Referral 08/26/2014  Referrals made to community resources none       Ingrown Hair An ingrown hair is a hair that curls and re-enters the skin instead of growing straight out of the skin. It happens most often with curly hair. It is usually more severe in the neck area, but it can occur in any shaved area, including the beard area, groin, scalp, and legs. An ingrown hair may cause small pockets of infection. CAUSES  Shaving closely, tweezing, or waxing, especially curly hair. Using hair removal creams can sometimes lead to ingrown hairs, especially in the groin. SYMPTOMS   Small bumps on the skin. The bumps may be filled with pus.  Pain.  Itching. DIAGNOSIS  Your caregiver can usually tell what is wrong by doing a physical exam. TREATMENT  If there is a severe infection, your caregiver may prescribe antibiotic medicines. Laser hair removal may also be done to help prevent  regrowth of the hair. HOME CARE INSTRUCTIONS   Do not shave irritated skin. You may start shaving again once the irritation has gone away.  If you are prone to ingrown hairs, consider not shaving as much as possible.  If antibiotics are prescribed, take them as directed. Finish them even if you start to feel better.  You may use a facial sponge in a gentle circular motion to help dislodge ingrown hairs on the face.  You may use a hair removal cream weekly, especially on the legs and underarms. Stop using the cream if it irritates your skin. Use caution when using hair removal creams in the groin area. SHAVING INSTRUCTIONS AFTER TREATMENT  Shower before shaving. Keep areas to be shaved packed in warm, moist wraps for several minutes before shaving. The warm, moist environment helps soften the hairs and makes ingrown hairs less likely to occur.  Use thick shaving gels.  Use a bump fighter razor that cuts hair slightly above the skin level or use an electric shaver with a longer shave setting.  Shave in the direction of hair growth. Avoid making multiple razor strokes.  Use moisturizing lotions after shaving. Document Released: 01/15/2001 Document Revised: 04/09/2012 Document Reviewed: 01/09/2012 [facility] Patient Information 2015 [place], [place]. This information is not intended to replace advice given to you by your health care provider. Make sure you discuss any questions you have with your health  care provider. Enlarged Spleen The spleen is an organ located in the upper abdomen under your left ribs. It is a spongelike organ, about the size of an orange, which acts as a filter. The spleen is part of the lymph system and filters the blood. It removes old blood cells and abnormal blood cells. It is also part of the immune response and helps fight infections. An enlarged spleen (splenomegaly) is usually noticed when it is almost twice its normal size. CAUSES  There are many possible causes of  an enlarged spleen. These causes include:  Infections (viral, bacterial, or parasitic).  Liver cirrhosis and other liver diseases.  Hemolytic anemia (types of anemia that lower your red blood cell count) and other blood diseases.  Hypersplenism (reduction in many types of blood cells by an enlarged spleen).  Blood cancers (leukemia, Hodgkin's disease).  Metabolic disorders (Gaucher's disease, Niemann-Pick disease).  Tumors and cysts.  Pressure or blood clots in the veins of the spleen.  Connective tissue disorders (lupus, rheumatoid arthritis with Felty's syndrome). SYMPTOMS  An enlarged spleen may not always cause symptoms. If symptoms do occur, they may include:  Pain in the upper left abdomen (pain may spread to the left shoulder or get worse when you take a breath).  Feeling full without eating or eating only a small amount.  Feeling tired.  Chronic infections.  Bleeding easily. DIAGNOSIS  Tests may include:  Physical examination of the left upper abdomen.  Blood tests to check red and white blood cells and other proteins and enzymes.  Imaging tests, such as abdominal ultrasonography, computerized X-ray scan (computed tomography, CT), and computerized magnetic scan (magnetic resonance imaging, MRI).  Taking a tissue sample (biopsy) of the liver to examine it.  Examining a bone marrow biopsy sample. TREATMENT  Treatment varies depending on the cause of the enlarged spleen. Treatment aims to manage the conditions that cause swelling of the spleen and reduce the size of the spleen. Treatment may include:  Medications to eliminate infection or treat disease.  Radiation therapy.  Blood transfusions.  Vaccinations. If these treatments are not successful, or the cause cannot be determined, surgery to remove the spleen (splenectomy) may be recommended. HOME CARE INSTRUCTIONS   Take all medications as directed.  Take all antibiotics, even if you start to feel  better. Discuss with your caregiver the use of a probiotic supplement to prevent stomach upset.  To avoid injury or a ruptured spleen:  Limit activities as directed.  Avoid contact sports.  Wear your seat belt in the car.  See your caregiver for vaccinations, follow up examinations and testing as directed.  Follow all of your caregiver's instructions on managing the conditions that cause your enlarged spleen. PREVENTION  It is not always possible to prevent an enlarged spleen. Reduce your chances of developing an enlarged spleen:  Practice good hygiene to prevent infection.  Get recommended vaccines to prevent infection. SEEK MEDICAL CARE IF:   You develop a fever (more than 100.78F [38.1 C]) or other signs of infection (chills, feeling unwell).  You experience injury or impact to the spleen area.  Your symptoms do not go away as you and your doctor expected.  You experience increased pain when you take in a breath.  Your symptoms worsen, or you develop new symptoms. MAKE SURE YOU:   Understand these instructions.  Will watch your condition.  Will get help right away if you are not doing well or get worse. Follow up with your caregiver to  find out the results of your tests. Not all test results may be available during your visit. If your test results are not back during the visit, make an appointment with your caregiver to find out the results. Do not assume everything is normal if you have not heard from your caregiver or the medical facility. It is important for you to follow up on all of your test results.  Document Released: 03/29/2010 Document Revised: 02/23/2014 Document Reviewed: 03/29/2010 [facility] Patient Information 2015 [place], [place]. This information is not intended to replace advice given to you by your health care provider. Make sure you discuss any questions you have with your health care provider. Doxycycline tablets or capsules What is this  medicine? DOXYCYCLINE (dox i SYE kleen) is a tetracycline antibiotic. It kills certain bacteria or stops their growth. It is used to treat many kinds of infections, like dental, skin, respiratory, and urinary tract infections. It also treats acne, Lyme disease, malaria, and certain sexually transmitted infections. This medicine may be used for other purposes; ask your health care provider or pharmacist if you have questions. COMMON BRAND NAME(S): Acticlate, Adoxa, Adoxa CK, Adoxa Pak, Adoxa TT, Alodox, Avidoxy, Doxal, Monodox, Morgidox 1x, Morgidox 1x Kit, Morgidox 2x, Morgidox 2x Kit, Ocudox, Vibra-Tabs, Vibramycin What should I tell my health care provider before I take this medicine? They need to know if you have any of these conditions: -liver disease -long exposure to sunlight like working outdoors -stomach problems like colitis -an unusual or allergic reaction to doxycycline, tetracycline antibiotics, other medicines, foods, dyes, or preservatives -pregnant or trying to get pregnant -breast-feeding How should I use this medicine? Take this medicine by mouth with a full glass of water. Follow the directions on the prescription label. It is best to take this medicine without food, but if it upsets your stomach take it with food. Take your medicine at regular intervals. Do not take your medicine more often than directed. Take all of your medicine as directed even if you think you are better. Do not skip doses or stop your medicine early. Talk to your pediatrician regarding the use of this medicine in children. Special care may be needed. While this drug may be prescribed for children as young as 63 years old for selected conditions, precautions do apply. Overdosage: If you think you have taken too much of this medicine contact a poison control center or emergency room at once. NOTE: This medicine is only for you. Do not share this medicine with others. What if I miss a dose? If you miss a dose,  take it as soon as you can. If it is almost time for your next dose, take only that dose. Do not take double or extra doses. What may interact with this medicine? -antacids -barbiturates -birth control pills -bismuth subsalicylate -carbamazepine -methoxyflurane -other antibiotics -phenytoin -vitamins that contain iron -warfarin This list may not describe all possible interactions. Give your health care provider a list of all the medicines, herbs, non-prescription drugs, or dietary supplements you use. Also tell them if you smoke, drink alcohol, or use illegal drugs. Some items may interact with your medicine. What should I watch for while using this medicine? Tell your doctor or health care professional if your symptoms do not improve. Do not treat diarrhea with over the counter products. Contact your doctor if you have diarrhea that lasts more than 2 days or if it is severe and watery. Do not take this medicine just before going to bed. It  may not dissolve properly when you lay down and can cause pain in your throat. Drink plenty of fluids while taking this medicine to also help reduce irritation in your throat. This medicine can make you more sensitive to the sun. Keep out of the sun. If you cannot avoid being in the sun, wear protective clothing and use sunscreen. Do not use sun lamps or tanning beds/booths. Birth control pills may not work properly while you are taking this medicine. Talk to your doctor about using an extra method of birth control. If you are being treated for a sexually transmitted infection, avoid sexual contact until you have finished your treatment. Your sexual partner may also need treatment. Avoid antacids, aluminum, calcium, magnesium, and iron products for 4 hours before and 2 hours after taking a dose of this medicine. If you are using this medicine to prevent malaria, you should still protect yourself from contact with mosquitos. Stay in screened-in areas, use  mosquito nets, keep your body covered, and use an insect repellent. What side effects may I notice from receiving this medicine? Side effects that you should report to your doctor or health care professional as soon as possible: -allergic reactions like skin rash, itching or hives, swelling of the face, lips, or tongue -difficulty breathing -fever -itching in the rectal or genital area -pain on swallowing -redness, blistering, peeling or loosening of the skin, including inside the mouth -severe stomach pain or cramps -unusual bleeding or bruising -unusually weak or tired -yellowing of the eyes or skin Side effects that usually do not require medical attention (report to your doctor or health care professional if they continue or are bothersome): -diarrhea -loss of appetite -nausea, vomiting This list may not describe all possible side effects. Call your doctor for medical advice about side effects. You may report side effects to FDA at 1-800-FDA-1088. Where should I keep my medicine? Keep out of the reach of children. Store at room temperature, below 30 degrees C (86 degrees F). Protect from light. Keep container tightly closed. Throw away any unused medicine after the expiration date. Taking this medicine after the expiration date can make you seriously ill. NOTE: This sheet is a summary. It may not cover all possible information. If you have questions about this medicine, talk to your doctor, pharmacist, or health care provider.  2015, [employer]. (2013-08-15 13:58:06)

## 2014-08-26 NOTE — Assessment & Plan Note (Addendum)
Will send pt to ED or try to contact on call surgery for I&D (called surg rec urology given location will page urology on call and rec pt come to [address] floor), consider culture Will Rx Doxycycline 100 mg bid x 10 days  Vicodin q6 prn #20 no refills Continue warm compresses if needed  F/u in 1-2 weeks prn otherwise 10/06/14

## 2014-08-31 ENCOUNTER — Telehealth: Payer: Self-pay | Admitting: *Deleted

## 2014-08-31 NOTE — Telephone Encounter (Signed)
-----   Message from [name], MD sent at 08/27/2014 11:38 AM EST ----- Call pt: white cells & platelets OK on current dose of JAKOFI but red count down  I would like him to start on iron pills  Ferrous sulfate 3 times daily - he can get over the counter

## 2014-08-31 NOTE — Telephone Encounter (Signed)
Pt called, no answer; message left - white cells count and platelet count are ok so stay on current dose of Jakofi but red count is down; need to start taking iron pills-Ferrous sulfate 3 times daily which can buy OTC Per Dr Beryle Beams. And if he has any questions to call the clinic.

## 2014-09-04 ENCOUNTER — Encounter: Payer: Self-pay | Admitting: Internal Medicine

## 2014-09-04 NOTE — Progress Notes (Unsigned)
Patient ID: Sean Walter, male   DOB: February 03, 1971, 43 y.o.   MRN: [id number]  I have reviewed the office notes from Dr. Diona Fanti ([facility])  for the office visit dated 08/26/14:  -Scrotal abscess s/p drainage: cultures pending  -Prescribed doxycycline 100mg  BID x 5 days -Follow-up in 1-2 weeks  I will reiterate these updates to the patient at their next scheduled visit.

## 2014-09-04 NOTE — Progress Notes (Signed)
Patient ID: Sean Walter, male   DOB: 07/08/1971, 43 y.o.   MRN: [id number]  Internal Medicine Clinic Attending  I saw and evaluated the patient.  I personally confirmed the key portions of the history and exam documented by Dr. Aundra Dubin and I reviewed pertinent patient test results.  The assessment, diagnosis, and plan were formulated together and I agree with the documentation in the resident's note.

## 2014-09-08 ENCOUNTER — Other Ambulatory Visit: Payer: Self-pay | Admitting: *Deleted

## 2014-09-08 MED ORDER — HYDROCODONE-ACETAMINOPHEN 5-325 MG PO TABS: 1.0000 | ORAL_TABLET | Freq: Four times a day (QID) | ORAL | Status: DC | PRN

## 2014-09-08 NOTE — Telephone Encounter (Signed)
Last refill was 10/20; but also received #20 tabs on 11/4 by Dr Aundra Dubin here at the clinic for groin abscess.

## 2014-09-21 ENCOUNTER — Other Ambulatory Visit (INDEPENDENT_AMBULATORY_CARE_PROVIDER_SITE_OTHER): Payer: BLUE CROSS/BLUE SHIELD

## 2014-09-21 DIAGNOSIS — D474 Osteomyelofibrosis: Secondary | ICD-10-CM

## 2014-09-21 DIAGNOSIS — D47Z9 Other specified neoplasms of uncertain behavior of lymphoid, hematopoietic and related tissue: Secondary | ICD-10-CM

## 2014-09-21 DIAGNOSIS — C946 Myelodysplastic disease, not classified: Secondary | ICD-10-CM

## 2014-09-21 DIAGNOSIS — R161 Splenomegaly, not elsewhere classified: Secondary | ICD-10-CM | POA: Diagnosis not present

## 2014-09-21 DIAGNOSIS — D471 Chronic myeloproliferative disease: Secondary | ICD-10-CM

## 2014-09-21 LAB — CBC WITH DIFFERENTIAL/PLATELET
Basophils Absolute: 0 10*3/uL (ref 0.0–0.1)
Basophils Relative: 0 % (ref 0–1)
EOS PCT: 1 % (ref 0–5)
Eosinophils Absolute: 0 10*3/uL (ref 0.0–0.7)
HCT: 39.5 % (ref 39.0–52.0)
Hemoglobin: 12.8 g/dL — ABNORMAL LOW (ref 13.0–17.0)
LYMPHS ABS: 0.6 10*3/uL — AB (ref 0.7–4.0)
LYMPHS PCT: 13 % (ref 12–46)
MCH: 26.6 pg (ref 26.0–34.0)
MCHC: 32.4 g/dL (ref 30.0–36.0)
MCV: 82 fL (ref 78.0–100.0)
MONOS PCT: 5 % (ref 3–12)
Monocytes Absolute: 0.2 10*3/uL (ref 0.1–1.0)
Neutro Abs: 3.7 10*3/uL (ref 1.7–7.7)
Neutrophils Relative %: 81 % — ABNORMAL HIGH (ref 43–77)
PLATELETS: 198 10*3/uL (ref 150–400)
RBC: 4.82 MIL/uL (ref 4.22–5.81)
RDW: 16.8 % — ABNORMAL HIGH (ref 11.5–15.5)
WBC: 4.6 10*3/uL (ref 4.0–10.5)

## 2014-09-21 LAB — COMPREHENSIVE METABOLIC PANEL
ALT: 32 U/L (ref 0–53)
AST: 22 U/L (ref 0–37)
Albumin: 4 g/dL (ref 3.5–5.2)
Alkaline Phosphatase: 60 U/L (ref 39–117)
BUN: 11 mg/dL (ref 6–23)
CALCIUM: 9.4 mg/dL (ref 8.4–10.5)
CHLORIDE: 100 meq/L (ref 96–112)
CO2: 27 meq/L (ref 19–32)
Creat: 1.51 mg/dL — ABNORMAL HIGH (ref 0.50–1.35)
GLUCOSE: 97 mg/dL (ref 70–99)
Potassium: 4.4 mEq/L (ref 3.5–5.3)
SODIUM: 140 meq/L (ref 135–145)
TOTAL PROTEIN: 7.7 g/dL (ref 6.0–8.3)
Total Bilirubin: 0.4 mg/dL (ref 0.3–1.2)

## 2014-09-21 LAB — URIC ACID: Uric Acid, Serum: 7.2 mg/dL (ref 4.0–7.8)

## 2014-09-21 LAB — LACTATE DEHYDROGENASE: LDH: 357 U/L — AB (ref 94–250)

## 2014-09-22 ENCOUNTER — Telehealth: Payer: Self-pay | Admitting: *Deleted

## 2014-09-22 NOTE — Telephone Encounter (Signed)
-----   Message from [name], MD sent at 09/21/2014  1:51 PM EST ----- Call pt blood counts stable - stay on same ose of [place]

## 2014-09-22 NOTE — Telephone Encounter (Signed)
Pt called/informed "blood counts stable - stay on same dose of Jakofi" per Dr Beryle Beams. He asked about his red cell count - pt informed Hgb up from 11.1 to 12.8 and to continue iron tablets.

## 2014-10-05 ENCOUNTER — Encounter: Payer: Self-pay | Admitting: Oncology

## 2014-10-05 ENCOUNTER — Ambulatory Visit: Payer: BC Managed Care – PPO | Admitting: Oncology

## 2014-10-05 NOTE — Progress Notes (Signed)
Patient ID: Sean Walter, male   DOB: 02-13-1971, 43 y.o.   MRN: [id number] Mr. Horst failed to report for his visit today. He is very compliant. We will reschedule him.

## 2014-10-06 ENCOUNTER — Other Ambulatory Visit: Payer: Self-pay | Admitting: Oncology

## 2014-10-06 MED ORDER — HYDROCODONE-ACETAMINOPHEN 5-325 MG PO TABS: 1.0000 | ORAL_TABLET | Freq: Four times a day (QID) | ORAL | Status: DC | PRN

## 2014-10-07 ENCOUNTER — Telehealth: Payer: Self-pay | Admitting: *Deleted

## 2014-10-07 NOTE — Telephone Encounter (Signed)
Pt had called and left message for Hydrocodone/Acet refill. Returned pt call - rx ready for pick up.

## 2014-10-19 ENCOUNTER — Other Ambulatory Visit: Payer: BC Managed Care – PPO

## 2014-10-21 ENCOUNTER — Other Ambulatory Visit (INDEPENDENT_AMBULATORY_CARE_PROVIDER_SITE_OTHER): Payer: BC Managed Care – PPO

## 2014-10-21 DIAGNOSIS — D47Z9 Other specified neoplasms of uncertain behavior of lymphoid, hematopoietic and related tissue: Secondary | ICD-10-CM

## 2014-10-21 DIAGNOSIS — C946 Myelodysplastic disease, not classified: Secondary | ICD-10-CM

## 2014-10-21 DIAGNOSIS — R161 Splenomegaly, not elsewhere classified: Secondary | ICD-10-CM | POA: Diagnosis not present

## 2014-10-21 DIAGNOSIS — D471 Chronic myeloproliferative disease: Secondary | ICD-10-CM

## 2014-10-21 LAB — COMPREHENSIVE METABOLIC PANEL
ALT: 27 U/L (ref 0–53)
AST: 24 U/L (ref 0–37)
Albumin: 4.6 g/dL (ref 3.5–5.2)
Alkaline Phosphatase: 47 U/L (ref 39–117)
BILIRUBIN TOTAL: 0.6 mg/dL (ref 0.2–1.2)
BUN: 14 mg/dL (ref 6–23)
CALCIUM: 9.5 mg/dL (ref 8.4–10.5)
CHLORIDE: 103 meq/L (ref 96–112)
CO2: 29 mEq/L (ref 19–32)
CREATININE: 1.49 mg/dL — AB (ref 0.50–1.35)
Glucose, Bld: 94 mg/dL (ref 70–99)
Potassium: 4.2 mEq/L (ref 3.5–5.3)
Sodium: 139 mEq/L (ref 135–145)
Total Protein: 7.6 g/dL (ref 6.0–8.3)

## 2014-10-21 LAB — CBC WITH DIFFERENTIAL/PLATELET
BASOS ABS: 0 10*3/uL (ref 0.0–0.1)
BASOS PCT: 0 % (ref 0–1)
Eosinophils Absolute: 0 10*3/uL (ref 0.0–0.7)
Eosinophils Relative: 1 % (ref 0–5)
HCT: 36.5 % — ABNORMAL LOW (ref 39.0–52.0)
Hemoglobin: 12.6 g/dL — ABNORMAL LOW (ref 13.0–17.0)
LYMPHS PCT: 12 % (ref 12–46)
Lymphs Abs: 0.5 10*3/uL — ABNORMAL LOW (ref 0.7–4.0)
MCH: 26.3 pg (ref 26.0–34.0)
MCHC: 34.5 g/dL (ref 30.0–36.0)
MCV: 76 fL — ABNORMAL LOW (ref 78.0–100.0)
Monocytes Absolute: 0.2 10*3/uL (ref 0.1–1.0)
Monocytes Relative: 4 % (ref 3–12)
NEUTROS ABS: 3.6 10*3/uL (ref 1.7–7.7)
Neutrophils Relative %: 83 % — ABNORMAL HIGH (ref 43–77)
PLATELETS: 224 10*3/uL (ref 150–400)
RBC: 4.8 MIL/uL (ref 4.22–5.81)
RDW: 16.5 % — ABNORMAL HIGH (ref 11.5–15.5)
WBC: 4.3 10*3/uL (ref 4.0–10.5)

## 2014-10-21 LAB — URIC ACID: Uric Acid, Serum: 5.9 mg/dL (ref 4.0–7.8)

## 2014-10-21 LAB — LACTATE DEHYDROGENASE: LDH: 301 U/L — AB (ref 94–250)

## 2014-10-22 ENCOUNTER — Other Ambulatory Visit: Payer: Self-pay | Admitting: Oncology

## 2014-10-22 MED ORDER — RUXOLITINIB PHOSPHATE 20 MG PO TABS: 20.0000 mg | ORAL_TABLET | Freq: Two times a day (BID) | ORAL | Status: DC

## 2014-10-27 ENCOUNTER — Encounter: Payer: Self-pay | Admitting: Internal Medicine

## 2014-10-27 ENCOUNTER — Telehealth: Payer: Self-pay | Admitting: *Deleted

## 2014-10-27 ENCOUNTER — Ambulatory Visit (INDEPENDENT_AMBULATORY_CARE_PROVIDER_SITE_OTHER): Payer: BLUE CROSS/BLUE SHIELD | Admitting: Internal Medicine

## 2014-10-27 VITALS — BP 154/83 | HR 88 | Temp 98.5°F | Ht 73.0 in | Wt 227.6 lb

## 2014-10-27 DIAGNOSIS — M545 Low back pain, unspecified: Secondary | ICD-10-CM

## 2014-10-27 HISTORY — DX: Low back pain, unspecified: M54.50

## 2014-10-27 MED ORDER — HYDROCODONE-ACETAMINOPHEN 5-325 MG PO TABS: 1.0000 | ORAL_TABLET | Freq: Two times a day (BID) | ORAL | Status: DC | PRN

## 2014-10-27 NOTE — Assessment & Plan Note (Addendum)
Pt states he has been experiencing worsening lower back pain for the past month.  States that he takes vicodin for pain (was prescribed 75 tabs on 12/15) but his son was opening the bottle for him and they spilled out.  He is here requesting additional pain medication.  I asked him about other medications and items such as physical therapy but he seems reluctant and states "I know what works for my body."  Apparently has been taking vicodin consistently according to the [place] at least going back to 10/2013 (that is how far I checked).  Last note by Dr. Beryle Beams indicated that he will try to wean the patient down.  On exam, there are no red flags.  No fever/chills, N/V/D.  No spinal/paraspinal tenderness.  Reflexes and strength intact with full ROM of lumbar spine.  I spoke with Dr. Lynnae January and agrees with the plan to provide him with 6 tabs with no refills and will forward this to his PCP and Dr. Beryle Beams for further management.  It has not been 6 weeks so I do not think imaging is warranted at this time and there are no red flags.  I also counseled him on the importance of not allowing other family members access to his medications and that he should keep his prescriptions in a locked cabinet.  He then stated that his son was 43 years old.  I reiterated the importance of protecting his medications and securing his medications from other individuals.  It appears that he has not signed a pain contract.   -refill one time only of vicodin #6 with no refills on 10/27/2014 -further mgmt to be provided by his PCP and Dr. Beryle Beams

## 2014-10-27 NOTE — Progress Notes (Signed)
Patient ID: Sean Walter, male   DOB: [birth year], 44 y.o. MRN: [id number]    Subjective:   Patient ID: Sean Walter male    DOB: 1971-05-15 45 y.o.    MRN: [id number] Health Maintenance Due: There are no preventive care reminders to display for this patient.  _________________________________________________  HPI: Sean Walter is a 44 y.o. male here for an acute visit.  Pt has a PMH outlined below.  Please see problem-based charting assessment and plan note for further details of medical issues addressed at today's visit.  PMH: Past Medical History  Diagnosis Date  . Asthma, cold induced   . Herpes   . Asthma   . No pertinent past medical history     enlarged spleen  . Spleen enlargement     myofibrosis need bonemarrow transplant  . Myelofibrosis     followed by Dr. Sherryl Manges  . 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 albuterol (PROVENTIL HFA;VENTOLIN HFA) 108 (90 BASE) MCG/ACT inhaler Inhale 2 puffs into the lungs every 6 (six) hours as needed for wheezing or shortness of breath.     . doxycycline (VIBRA-TABS) 100 MG tablet Take 1 tablet (100 mg total) by mouth 2 (two) times daily. 20 tablet 0  . ruxolitinib phosphate (JAKAFI) 20 MG tablet Take 1 tablet (20 mg total) by mouth 2 (two) times daily. 60 tablet 11   No current facility-administered medications on file prior to visit.    Allergies: No Known Allergies  FH: No family history on file.  SH: History   Social History  . Marital Status: Single    Spouse Name: N/A    Number of Children: N/A  . Years of Education: N/A   Social History Main Topics  . Smoking status: Current Some Day Smoker -- 0.20 packs/day    Types: Cigarettes  . Smokeless tobacco: None     Comment: 2 per day  . Alcohol Use: 0.0 oz/week    0 Not specified per week     Comment: daily   . Drug Use: No  . Sexual Activity: None   Other Topics Concern  . None   Social History Narrative    Review of Systems: Constitutional: Negative for fever, chills and weight loss.  Eyes: Negative for blurred vision.  Respiratory: Negative for cough and shortness of breath.  Cardiovascular: Negative for chest pain, palpitations and leg swelling.  Gastrointestinal: Negative for nausea, vomiting, abdominal pain, diarrhea, constipation and blood in stool.  Genitourinary: Negative for dysuria, urgency and frequency.  Musculoskeletal: Negative for myalgias and +back pain.  Neurological: Negative for dizziness, weakness and headaches.     Objective:   Vital Signs: Filed Vitals:   10/27/14 0856  BP: 154/83  Pulse: 88  Temp: 98.5 F (36.9 C)  TempSrc: Oral  Height: 6\' 1"  (1.854 m)  Weight: 227 lb 9.6 oz (103.239 kg)  SpO2: 100%    BP Readings from Last 3 Encounters:  10/27/14 154/83  08/26/14 132/71  08/04/14 139/80    Physical Exam: Constitutional: Vital signs reviewed.  Patient is well-developed and well-nourished in NAD and cooperative with exam.  Head: Normocephalic and atraumatic. Eyes: PERRL, EOMI, conjunctivae nl, no scleral icterus.  Neck: Supple. Cardiovascular: RRR, no MRG. Pulmonary/Chest: normal effort, non-tender to palpation, CTAB, no wheezes, rales, or rhonchi. Abdominal: Soft. NT/ND +BS. Musculoskeletal:  Full ROM of lumbar spine, no deformity, no spinal/paraspinal tenderness, -SLR b/l, 2+ patellar reflex b/l, 5/5 strength LE b/l.    Neurological: A&O x3, cranial nerves II-XII are grossly intact, moving all extremities. Extremities: 2+DP b/l; no pitting edema. Skin: Warm, dry and intact. No rash.   Assessment & Plan:   Assessment and plan was discussed and formulated with my attending.

## 2014-10-27 NOTE — Telephone Encounter (Signed)
-----   Message from [name], MD sent at 10/22/2014 11:59 AM EST ----- Call pt: counts stable. Stay on current dose of Jakofi.  Company has been trying to reach him so we can renew his Rx: he needs to call them

## 2014-10-27 NOTE — Telephone Encounter (Signed)
Pt's here at the clinic; informed counts are stable and stay on current dose of Jakofi per Dr Beryle Beams. States he had already talked to someone at the company about his medication.

## 2014-10-27 NOTE — Patient Instructions (Signed)
Thank you for your visit today.   Please return to the internal medicine clinic as needed.   Your current medical regimen is effective;  continue present plan and take all medications as prescribed.   I have prescribed a short supply of vicodin for your back pain.  You will need to speak with your primary doctor regarding your pain management.   Please be sure to bring all of your medications with you to every visit; this includes herbal supplements, vitamins, eye drops, and any over-the-counter medications.   Should you have any questions regarding your medications and/or any new or worsening symptoms, please be sure to call the clinic at [phone].   If you believe that you are suffering from a life threatening condition or one that may result in the loss of limb or function, then you should call 911 or proceed to the nearest Emergency Department.     A healthy lifestyle and preventative care can promote health and wellness.   Maintain regular health, dental, and eye exams.  Eat a healthy diet. Foods like vegetables, fruits, whole grains, low-fat dairy products, and lean protein foods contain the nutrients you need without too many calories. Decrease your intake of foods high in solid fats, added sugars, and salt. Get information about a proper diet from your caregiver, if necessary.  Regular physical exercise is one of the most important things you can do for your health. Most adults should get at least 150 minutes of moderate-intensity exercise (any activity that increases your heart rate and causes you to sweat) each week. In addition, most adults need muscle-strengthening exercises on 2 or more days a week.   Maintain a healthy weight. The body mass index (BMI) is a screening tool to identify possible weight problems. It provides an estimate of body fat based on height and weight. Your caregiver can help determine your BMI, and can help you achieve or maintain a healthy weight. For  adults 20 years and older:  A BMI below 18.5 is considered underweight.  A BMI of 18.5 to 24.9 is normal.  A BMI of 25 to 29.9 is considered overweight.  A BMI of 30 and above is considered obese.

## 2014-10-28 NOTE — Progress Notes (Signed)
Internal Medicine Clinic Attending  Case discussed with Dr. Gill soon after the resident saw the patient.  We reviewed the resident's history and exam and pertinent patient test results.  I agree with the assessment, diagnosis, and plan of care documented in the resident's note.  

## 2014-11-03 ENCOUNTER — Telehealth: Payer: Self-pay | Admitting: *Deleted

## 2014-11-03 NOTE — Telephone Encounter (Signed)
Pt called had ingrown ? In  private area - pt opened bump himself. Wants  to be seen as soon as possible - can not come today. Pt wants antibiotics.  Appt made 11/04/14 8:15AM Dr Heber [place] - pt aware. Hilda Blades Mourad Cwikla RN 11/03/14 11AM

## 2014-11-04 ENCOUNTER — Other Ambulatory Visit: Payer: Self-pay | Admitting: *Deleted

## 2014-11-04 ENCOUNTER — Encounter: Payer: Self-pay | Admitting: Internal Medicine

## 2014-11-04 ENCOUNTER — Ambulatory Visit (INDEPENDENT_AMBULATORY_CARE_PROVIDER_SITE_OTHER): Payer: BLUE CROSS/BLUE SHIELD | Admitting: Internal Medicine

## 2014-11-04 VITALS — BP 140/71 | HR 72 | Temp 98.2°F | Wt 226.5 lb

## 2014-11-04 DIAGNOSIS — M545 Low back pain, unspecified: Secondary | ICD-10-CM

## 2014-11-04 DIAGNOSIS — L739 Follicular disorder, unspecified: Secondary | ICD-10-CM | POA: Diagnosis not present

## 2014-11-04 MED ORDER — HYDROCODONE-ACETAMINOPHEN 5-325 MG PO TABS: 1.0000 | ORAL_TABLET | Freq: Two times a day (BID) | ORAL | Status: DC | PRN

## 2014-11-04 MED ORDER — SULFAMETHOXAZOLE-TRIMETHOPRIM 800-160 MG PO TABS: 1.0000 | ORAL_TABLET | Freq: Two times a day (BID) | ORAL | Status: DC

## 2014-11-04 NOTE — Patient Instructions (Signed)
General Instructions: Please start taking Bactrim DS 1 tablet twice a day for 2 days. If the area grows you need to come back here sooner to have it lanced by Urology.  Please also return if you develop fever or chills.  If not better after a week please return.  Please bring your medicines with you each time you come to clinic.  Medicines may include prescription medications, over-the-counter medications, herbal remedies, eye drops, vitamins, or other pills.   Progress Toward Treatment Goals:  No flowsheet data found.  Self Care Goals & Plans:  Self Care Goal 08/26/2014  Manage my medications take my medicines as prescribed; bring my medications to every visit; refill my medications on time; follow the sick day instructions if I am sick  Eat healthy foods drink diet soda or water instead of juice or soda; eat more vegetables; eat foods that are low in salt; eat baked foods instead of fried foods; eat fruit for snacks and desserts; eat smaller portions  Be physically active find an activity I enjoy  Meeting treatment goals maintain the current self-care plan    No flowsheet data found.   Care Management & Community Referrals:  Referral 08/26/2014  Referrals made to community resources none

## 2014-11-04 NOTE — Assessment & Plan Note (Signed)
-   Wants refill of Norco for chronic low back pain, given 6 pills by Dr. Gordy Levan on 10/27/14, has appointment with PCP on 11/13/14 to discuss ongoing use. - We refill Norco with #18 pills to last until his appointment with Dr Posey Pronto.

## 2014-11-04 NOTE — Assessment & Plan Note (Addendum)
-  Patient presents again with folliculitis, there may be a small area that could be I&D but this would likely need to be done by Urology and patient wants to avoid this if possible.  He does not have systemic symptoms but wants an oral antibiotic. - Rx Bactrim x 10 days patient given strict return instructions if gets worse or does not resolve. - Patient advised not to shave groin and if he does to wash area well with soap and water prior

## 2014-11-04 NOTE — Progress Notes (Signed)
[facility] Subjective:   Patient ID: Sean Walter male   DOB: 1971-04-22 44 y.o.   MRN: [id number]  HPI: Sean Walter is a 44 y.o. male with a PMH below who presents for a boil on his left groin.  He first noticed it a week ago and it gradually grew in size.  It was red and painful.  He notes that 2 days ago he cleaned the area with alcohol and poped it with an earing.  He notes a light pink fluid drained.  He notes that since then it has not completely resolved but the pain is better.  He notes this happens from time to time after he shaves the area. He did have a similar issue in November but he reports the area was much larger at that time and he is trying to head off things early this time.  In November he was sent over to Dr. Diona Fanti at [facility] and a scrotal abscess was drained and derscribed doxycycline x 5 days. He wants to avoid I&D if possible.    Past Medical History  Diagnosis Date  . Asthma, cold induced   . Herpes   . Asthma   . No pertinent past medical history     enlarged spleen  . Spleen enlargement     myofibrosis need bonemarrow transplant  . Myelofibrosis     followed by Dr. Sherryl Manges  . 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 albuterol (PROVENTIL HFA;VENTOLIN HFA) 108 (90 BASE) MCG/ACT inhaler Inhale 2 puffs into the lungs every 6 (six) hours as needed for wheezing or shortness of breath.     . doxycycline (VIBRA-TABS) 100 MG tablet Take 1 tablet (100 mg total) by mouth 2 (two) times daily. 20 tablet 0  . HYDROcodone-acetaminophen (NORCO/VICODIN) 5-325 MG per tablet Take 1 tablet by mouth every 12 (twelve) hours as needed for moderate pain. 6 tablet 0  . ruxolitinib phosphate (JAKAFI) 20 MG tablet Take 1 tablet (20 mg total) by mouth 2 (two) times daily. 60 tablet 11    No current facility-administered medications for this visit.   No family history on file. History   Social History  . Marital Status: Single    Spouse Name: N/A    Number of Children: N/A  . Years of Education: N/A   Social History Main Topics  . Smoking status: Current Some Day Smoker -- 0.20 packs/day    Types: Cigarettes  . Smokeless tobacco: None     Comment: 2 per day  . Alcohol Use: 0.0 oz/week    0 Not specified per week     Comment: daily   . Drug Use: No  . Sexual Activity: None   Other Topics Concern  . None   Social History Narrative   Review of Systems: Review of Systems  Constitutional: Negative for fever and chills.  Respiratory: Negative for shortness of breath.   Genitourinary: Negative for dysuria.  Skin: Negative for rash.  Neurological: Negative for headaches.     Objective:  Physical Exam: Filed Vitals:   11/04/14 0824  BP: 140/71  Pulse: 72  Temp: 98.2 F (36.8 C)  TempSrc: Oral  Weight: 226 lb 8 oz (102.74 kg)  SpO2: 100%  Physical Exam  Constitutional: He appears well-developed and well-nourished.  Cardiovascular: Normal rate and regular rhythm.   Pulmonary/Chest: Effort normal  and breath sounds normal.  Abdominal: Soft. Bowel sounds are normal.  Genitourinary: Testes normal and penis normal.     Lymphadenopathy: No inguinal adenopathy noted on the right or left side.  Nursing note and vitals reviewed.   Assessment & Plan:  Case discussed with Dr. Wylene Simmer -Patient presents again with folliculitis, there may be a small area that could be I&D but this would likely need to be done by Urology and patient wants to avoid this if possible.  He does not have systemic symptoms but wants an oral antibiotic. - Rx Bactrim x 10 days patient given strict return instructions if gets worse or does not resolve. - Patient advised not to shave groin and if he does to wash area well with soap and water prior   Lower back  pain - Wants refill of Norco for chronic low back pain, given 6 pills by Dr. Gordy Levan on 10/27/14, has appointment with PCP on 11/13/14 to discuss ongoing use. - We refill Norco with #18 pills to last until his appointment with Dr Posey Pronto.     Medications Ordered Meds ordered this encounter  Medications  . sulfamethoxazole-trimethoprim (SEPTRA DS) 800-160 MG per tablet    Sig: Take 1 tablet by mouth 2 (two) times daily.    Dispense:  20 tablet    Refill:  0  . HYDROcodone-acetaminophen (NORCO/VICODIN) 5-325 MG per tablet    Sig: Take 1 tablet by mouth every 12 (twelve) hours as needed for moderate pain.    Dispense:  18 tablet    Refill:  0   Other Orders No orders of the defined types were placed in this encounter.

## 2014-11-05 ENCOUNTER — Other Ambulatory Visit: Payer: Self-pay | Admitting: Oncology

## 2014-11-05 MED ORDER — HYDROCODONE-ACETAMINOPHEN 5-325 MG PO TABS: 1.0000 | ORAL_TABLET | Freq: Two times a day (BID) | ORAL | Status: DC | PRN

## 2014-11-05 NOTE — Progress Notes (Signed)
INTERNAL MEDICINE TEACHING ATTENDING ADDENDUM - Adelis Docter, MD: I reviewed and discussed at the time of visit with the resident Dr. Hoffman, the patient's medical history, physical examination, diagnosis and results of pertinent tests and treatment and I agree with the patient's care as documented.  

## 2014-11-06 ENCOUNTER — Encounter: Payer: BC Managed Care – PPO | Admitting: Internal Medicine

## 2014-11-13 ENCOUNTER — Encounter: Payer: BC Managed Care – PPO | Admitting: Internal Medicine

## 2014-11-16 ENCOUNTER — Other Ambulatory Visit: Payer: BC Managed Care – PPO

## 2014-11-23 ENCOUNTER — Encounter: Payer: BC Managed Care – PPO | Admitting: Oncology

## 2014-11-23 ENCOUNTER — Encounter: Payer: Self-pay | Admitting: Oncology

## 2014-11-23 NOTE — Progress Notes (Signed)
Patient ID: Sean Walter, male   DOB: 1971-05-16, 44 y.o.   MRN: [id number] 44 year old man followed for early myelofibrosis on JAKAFI. He failed to report again today for his scheduled visit.

## 2014-11-24 ENCOUNTER — Encounter: Payer: Self-pay | Admitting: Internal Medicine

## 2014-11-24 ENCOUNTER — Ambulatory Visit (INDEPENDENT_AMBULATORY_CARE_PROVIDER_SITE_OTHER): Payer: BLUE CROSS/BLUE SHIELD | Admitting: Internal Medicine

## 2014-11-24 VITALS — BP 125/78 | HR 91 | Temp 98.1°F | Wt 221.9 lb

## 2014-11-24 DIAGNOSIS — M25512 Pain in left shoulder: Secondary | ICD-10-CM

## 2014-11-24 HISTORY — DX: Pain in left shoulder: M25.512

## 2014-11-24 MED ORDER — ACETAMINOPHEN 325 MG PO TABS: 650.0000 mg | ORAL_TABLET | ORAL | Status: AC | PRN

## 2014-11-24 MED ORDER — CYCLOBENZAPRINE HCL 5 MG PO TABS: 5.0000 mg | ORAL_TABLET | Freq: Three times a day (TID) | ORAL | Status: DC | PRN

## 2014-11-24 NOTE — Patient Instructions (Signed)
General Instructions:   Please try to bring all your medicines next time. This will help [place] keep you safe from mistakes.  Alternate Tylenol 650mg  with Flexiril 5mg  every 4 hours.

## 2014-11-27 NOTE — Assessment & Plan Note (Signed)
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 -Feels pain does not allow him to sleep at night -Has tried taking some hydrocodone for his pain but reports that that has not relieved it -Denies any cramping, back pain, falls, trauma, numbness or tingling, or extension of pain to any other part of his arm  Assessment -Likely tendinopathy though he has no pain on exam and may have overexerted himself at work -Adhesive capsulitis or rotator cuff tear is very unlikely given the absence of pain elicited with movements on physical exam  Plan -Start Flexeril 5 mg every 8 hours as needed -Advised to avoid taking Flexeril while operating machinery or while driving -Recommend alternating Flexeril with Tylenol 650 mg every 4 hours as needed -Cautioned patient not to take more than 3 g of Tylenol per day -Follow-up in 1 week for reassessment

## 2014-11-27 NOTE — Progress Notes (Signed)
   Subjective:    Patient ID: Carin Hock, male    DOB: [birth year], 45 y.o.   MRN: [id number]  HPI Mr. Kimberlin is a 44 year old male with enlarged spleen who presents today for left shoulder pain.Please see assessment & plan for documentation of each problem.   Review of Systems  Constitutional: Negative for fever.  Respiratory: Negative for shortness of breath.   Cardiovascular: Negative for chest pain.  Gastrointestinal: Negative for nausea, vomiting, abdominal pain and diarrhea.  Musculoskeletal: Negative for back pain.  Neurological: Negative for dizziness and numbness.       Objective:   Physical Exam Constitutional: He is oriented to person, place, and time. He appears well-developed and well-nourished. No distress.  HENT:  Head: Normocephalic and atraumatic.  Eyes: Conjunctivae are normal. Pupils are equal, round, and reactive to light.  Cardiovascular: Normal rate, regular rhythm and normal heart sounds.  Exam reveals no gallop and no friction rub.   No murmur heard. Shoulder: No pain elicited with active or passive range of motion along with abduction or adduction of left shoulder. No tenderness to palpation Pulmonary/Chest: Effort normal. No respiratory distress. He has no wheezes. He has no rales.  Abdominal: Soft. Bowel sounds are normal. He exhibits no distension. There is no tenderness.  Neurological: He is alert and oriented to person, place, and time. No cranial nerve deficit. Coordination normal.  Skin: Skin is warm and dry. He is not diaphoretic.  Psychiatric: His behavior is normal.          Assessment & Plan:

## 2014-11-30 ENCOUNTER — Telehealth: Payer: Self-pay | Admitting: Internal Medicine

## 2014-11-30 NOTE — Telephone Encounter (Signed)
Call to patient to confirm appointment for 12/01/14 at 8:15 and 9:45. lmtcb

## 2014-12-01 ENCOUNTER — Ambulatory Visit (INDEPENDENT_AMBULATORY_CARE_PROVIDER_SITE_OTHER): Payer: BLUE CROSS/BLUE SHIELD | Admitting: Internal Medicine

## 2014-12-01 ENCOUNTER — Ambulatory Visit (HOSPITAL_COMMUNITY)
Admission: RE | Admit: 2014-12-01 | Discharge: 2014-12-01 | Disposition: A | Discharge status: Home / Self Care | Payer: BLUE CROSS/BLUE SHIELD | Source: Ambulatory Visit | Attending: Internal Medicine | Admitting: Internal Medicine

## 2014-12-01 ENCOUNTER — Ambulatory Visit (INDEPENDENT_AMBULATORY_CARE_PROVIDER_SITE_OTHER): Payer: BLUE CROSS/BLUE SHIELD | Admitting: Oncology

## 2014-12-01 ENCOUNTER — Encounter: Payer: Self-pay | Admitting: Oncology

## 2014-12-01 ENCOUNTER — Encounter: Payer: Self-pay | Admitting: Internal Medicine

## 2014-12-01 VITALS — BP 139/83 | HR 71 | Temp 98.4°F | Ht 72.0 in | Wt 225.0 lb

## 2014-12-01 VITALS — BP 152/81 | HR 85 | Temp 98.4°F | Wt 225.0 lb

## 2014-12-01 DIAGNOSIS — D474 Osteomyelofibrosis: Secondary | ICD-10-CM | POA: Diagnosis not present

## 2014-12-01 DIAGNOSIS — M542 Cervicalgia: Secondary | ICD-10-CM | POA: Diagnosis present

## 2014-12-01 DIAGNOSIS — R161 Splenomegaly, not elsewhere classified: Secondary | ICD-10-CM

## 2014-12-01 DIAGNOSIS — D47Z9 Other specified neoplasms of uncertain behavior of lymphoid, hematopoietic and related tissue: Secondary | ICD-10-CM | POA: Diagnosis not present

## 2014-12-01 DIAGNOSIS — M25512 Pain in left shoulder: Secondary | ICD-10-CM

## 2014-12-01 DIAGNOSIS — D471 Chronic myeloproliferative disease: Secondary | ICD-10-CM

## 2014-12-01 DIAGNOSIS — M47892 Other spondylosis, cervical region: Secondary | ICD-10-CM | POA: Insufficient documentation

## 2014-12-01 DIAGNOSIS — C946 Myelodysplastic disease, not classified: Secondary | ICD-10-CM

## 2014-12-01 LAB — CBC WITH DIFFERENTIAL/PLATELET
Basophils Absolute: 0 10*3/uL (ref 0.0–0.1)
Basophils Relative: 0 % (ref 0–1)
EOS ABS: 0.1 10*3/uL (ref 0.0–0.7)
EOS PCT: 1 % (ref 0–5)
HCT: 39.6 % (ref 39.0–52.0)
Hemoglobin: 13 g/dL (ref 13.0–17.0)
LYMPHS ABS: 0.6 10*3/uL — AB (ref 0.7–4.0)
Lymphocytes Relative: 12 % (ref 12–46)
MCH: 26.3 pg (ref 26.0–34.0)
MCHC: 32.8 g/dL (ref 30.0–36.0)
MCV: 80.2 fL (ref 78.0–100.0)
MONOS PCT: 6 % (ref 3–12)
Monocytes Absolute: 0.3 10*3/uL (ref 0.1–1.0)
Neutro Abs: 4.1 10*3/uL (ref 1.7–7.7)
Neutrophils Relative %: 81 % — ABNORMAL HIGH (ref 43–77)
Platelets: 190 10*3/uL (ref 150–400)
RBC: 4.94 MIL/uL (ref 4.22–5.81)
RDW: 15.8 % — AB (ref 11.5–15.5)
WBC: 5.1 10*3/uL (ref 4.0–10.5)

## 2014-12-01 LAB — COMPREHENSIVE METABOLIC PANEL
ALBUMIN: 4 g/dL (ref 3.5–5.2)
ALK PHOS: 70 U/L (ref 39–117)
ALT: 51 U/L (ref 0–53)
AST: 37 U/L (ref 0–37)
Anion gap: 5 (ref 5–15)
BUN: 10 mg/dL (ref 6–23)
CALCIUM: 9.1 mg/dL (ref 8.4–10.5)
CO2: 28 mmol/L (ref 19–32)
Chloride: 103 mmol/L (ref 96–112)
Creatinine, Ser: 1.43 mg/dL — ABNORMAL HIGH (ref 0.50–1.35)
GFR calc Af Amer: 69 mL/min — ABNORMAL LOW (ref >90)
GFR calc non Af Amer: 59 mL/min — ABNORMAL LOW (ref >90)
Glucose, Bld: 106 mg/dL — ABNORMAL HIGH (ref 70–99)
POTASSIUM: 4 mmol/L (ref 3.5–5.1)
Sodium: 136 mmol/L (ref 135–145)
Total Bilirubin: 0.6 mg/dL (ref 0.3–1.2)
Total Protein: 7.3 g/dL (ref 6.0–8.3)

## 2014-12-01 LAB — URIC ACID: Uric Acid, Serum: 5.3 mg/dL (ref 4.0–7.8)

## 2014-12-01 LAB — LACTATE DEHYDROGENASE: LDH: 272 U/L — AB (ref 94–250)

## 2014-12-01 MED ORDER — DICLOFENAC SODIUM 1 % TD GEL: 2.0000 g | Freq: Four times a day (QID) | TRANSDERMAL | Status: DC

## 2014-12-01 NOTE — Progress Notes (Signed)
Internal Medicine Clinic Attending  Case discussed with Dr. Patel at the time of the visit.  We reviewed the resident's history and exam and pertinent patient test results.  I agree with the assessment, diagnosis, and plan of care documented in the resident's note.  

## 2014-12-01 NOTE — Progress Notes (Signed)
Patient ID: Sean Walter, male   DOB: [birth year], 44 y.o.   MRN: [id number] Hematology and Oncology Follow Up Visit  Sean Walter [id number] [birth year] 44 y.o. 12/01/2014 2:25 PM   Principle Diagnosis: Encounter Diagnoses  Name Primary?  . Primary myelofibrosis Yes  . Spleen enlargement   . Unclassifiable myelodysplastic/myeloproliferative neoplasm      Interim History:    Medications: reviewed  Allergies:  Allergies  Allergen Reactions  . Ibuprofen Other (See Comments)    Indigestion  Clinical Summary: 44 year old man who was initially called myelofibrosis but who  more likely has  a non-BCR-ABL myeloproliferative disorder. He presented with leukocytosis and massive splenomegaly in January 2012. Bone marrow biopsy done in 11/16/10 was hypercellular with prominent proliferation of megakaryocytes many of which had abnormal morphology. Collagen and reticulin stains were focally positive. There were no excess blasts; in fact, on a 500 cell count differential, 0% blasts were recorded. Routine cytogenetics were normal but FISH probes were not done. Peripheral blood was negative for the presence of BCR-ABL transcripts.  He is [name]. (03/07/13)  The patient was started on JAKOFI in February, 2012. Current dose is 25 mg twice daily. He has had a modest response. Stable splenomegaly.  I have now followed him since July of 2013. Blood counts remain overall stable as well as his spleen size as assessed by ultrasound. Most recent ultrasound done 07/10/2014 spleen 15.9 cm in length, 620 cm in volume, unchanged compared with a previous 6 month interval study done 04/17/2014.  Most recent CT scan 12/26/13 spleen vertical length 170.6 cm   compared to a study from January 2012 measured the vertical length of the spleen at 265 cm prior to initiation of JAKOFI. Study done 06/01/2013, spleen vertical length 175.5 cm   He is tolerating the JAKOFI well with no obvious side effects  except for cumulative myelotoxicity requiring a dose reduction from 25 mg twice daily down to 20 mg twice daily in late August 2015.  He continues to have intermittent left upper quadrant abdominal pain and takes Percocet. He is using 90 tablets per month. I have encouraged him to decrease his use of this medication.  Since  December, 2014 he has developed multiple small warts primarily on the back of his neck but also on the front with some larger scattered lesions up to about a half a centimeter on his scalp. He shaves his head. He has seen a dermatologist. Some of the larger lesions were excised. Some of the smaller lesions were frozen. They keep coming back. He wanted to know if they could be from the [facility]. I looked this up for him. Nonmelanoma skin cancers have been seen and possibly associated with the drug. There is no mention of any viral reactivation or condylomata associated with this drug.  Interim history: Overall he is doing well. His wife just K birth to their sixth child. He tells me that he was called back to [facility] for a follow-up appointment. Apparently they don't have his previous records which I find hard to believe since he had a formal consultation there at time of diagnosis when he was still under the care of Dr. Lamonte Sakai and all records were forwarded at that time. I have sent intermittent progress notes to [facility] as well. Abdominal symptoms are minimal at this time. No interim infections. He continues to follow-up with dermatology for the warty lesions on his neck and scalp.   Review of Systems: See HPI  Remaining  ROS negative:   Physical Exam: Blood pressure 139/83, pulse 71, temperature 98.4 F (36.9 C), temperature source Oral, height 6' (1.829 m), weight 225 lb (102.059 kg), SpO2 100 %. Wt Readings from Last 3 Encounters:  12/01/14 225 lb (102.059 kg)  12/01/14 225 lb (102.059 kg)  11/24/14 221 lb 14.4 oz (100.653 kg)     General appearance: Muscular African-American  man HENNT: Pharynx no erythema, exudate, mass, or ulcer. No thyromegaly or thyroid nodules Lymph nodes: No cervical, supraclavicular, or axillary lymphadenopathy Breasts: Lungs: Clear to auscultation, resonant to percussion throughout Heart: Regular rhythm, no murmur, no gallop, no rub, no click, no edema Abdomen: Soft, nontender, normal bowel sounds, no mass, no organomegaly (despite splenomegaly reported on scan) Extremities: No edema, no calf tenderness Musculoskeletal: no joint deformities GU:  Vascular: Carotid pulses 2+, no bruits, distal pulses: Dorsalis pedis 1+ symmetric Neurologic: Alert, oriented, PERRLA, optic discs sharp and vessels normal, no hemorrhage or exudate, cranial nerves grossly normal, motor strength 5 over 5, reflexes 1+ symmetric, upper body coordination normal, gait normal, Skin: Chronic molluscum contagiosum versus simple verrucae skin eruption primarily on the back of his neck and scalp. No rash or ecchymosis. Multiple tattoos  Lab Results: CBC W/Diff    Component Value Date/Time   WBC 5.1 12/01/2014 0850   WBC 7.1 01/05/2014 1035   RBC 4.94 12/01/2014 0850   RBC 4.69 01/05/2014 1035   HGB 13.0 12/01/2014 0850   HGB 12.0* 01/05/2014 1035   HCT 39.6 12/01/2014 0850   HCT 37.1* 01/05/2014 1035   PLT 190 12/01/2014 0850   PLT 134* 01/05/2014 1035   MCV 80.2 12/01/2014 0850   MCV 79.1* 01/05/2014 1035   MCH 26.3 12/01/2014 0850   MCH 25.6* 01/05/2014 1035   MCHC 32.8 12/01/2014 0850   MCHC 32.3 01/05/2014 1035   RDW 15.8* 12/01/2014 0850   RDW 16.8* 01/05/2014 1035   LYMPHSABS 0.6* 12/01/2014 0850   LYMPHSABS 0.7* 01/05/2014 1035   MONOABS 0.3 12/01/2014 0850   MONOABS 0.3 01/05/2014 1035   EOSABS 0.1 12/01/2014 0850   EOSABS 0.1 01/05/2014 1035   BASOSABS 0.0 12/01/2014 0850   BASOSABS 0.1 01/05/2014 1035     Chemistry      Component Value Date/Time   NA 136 12/01/2014 0850   NA 141 01/05/2014 1035   K 4.0 12/01/2014 0850   K 3.9 01/05/2014  1035   CL 103 12/01/2014 0850   CL 106 03/07/2013 0826   CO2 28 12/01/2014 0850   CO2 24 01/05/2014 1035   BUN 10 12/01/2014 0850   BUN 11.3 01/05/2014 1035   CREATININE 1.43* 12/01/2014 0850   CREATININE 1.49* 10/21/2014 1043   CREATININE 1.8* 01/05/2014 1035      Component Value Date/Time   CALCIUM 9.1 12/01/2014 0850   CALCIUM 9.5 01/05/2014 1035   ALKPHOS 70 12/01/2014 0850   ALKPHOS 57 01/05/2014 1035   AST 37 12/01/2014 0850   AST 21 01/05/2014 1035   ALT 51 12/01/2014 0850   ALT 31 01/05/2014 1035   BILITOT 0.6 12/01/2014 0850   BILITOT 0.49 01/05/2014 1035    CBC 12/01/2014: Hemoglobin 13, hematocrit 39.6, MCV 80.2, white count 5100, 81% neutrophils, 12 lymphocytes, 6 monocytes, 1 eosinophil, platelet count 190,000.   Radiological Studies: Dg Cervical Spine Complete  12/01/2014   CLINICAL DATA:  One month history of persistent neck pain. Patient lifts heavy objects at work routinely  EXAM: CERVICAL SPINE  4+ VIEWS  COMPARISON:  None.  FINDINGS: Frontal,  lateral, open-mouth odontoid, and bilateral oblique views were obtained. There is no fracture or spondylolisthesis. Prevertebral soft tissues and predental space regions are normal. There is moderate disc space narrowing at C5-6. Disc spaces appear normal. There is exit foraminal narrowing on the oblique views at C5-6 bilaterally.  IMPRESSION: Osteoarthritic changes C5-6.  No fracture or spondylolisthesis.   Electronically Signed   By: Lowella Grip III M.D.   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  Plan: continue the same  Recent report in the June 25, 2014 [employer] [employer] with very interesting back-to-back articles looking at a new class of antineoplastic agents: Telomerase inhibitors which have shown significant activity in a pilot study on  patients with myelofibrosis and another cohort of patients with essential thrombocythemia including some molecular remissions. This is very interesting and provocative for the future. He is still under consideration for potential bone marrow transplant at [facility]. We'll re-send any records that have been misplaced  #2. Transient elevation of creatinine up to 1.8 now back to his baseline of 1.4 and likely normal for his muscle mass.  #3. 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  #4. Molluscum contagiosum versus simple verrucae skin of neck and scalp ongoing follow-up with dermatology.  CC: Patient Care Team: Charlott Rakes, MD as PCP - General (Internal Medicine)   [name], MD 2/9/20162:25 PM

## 2014-12-01 NOTE — Patient Instructions (Signed)
Continue monthly CBC,diff, CMet, LDH at [facility] Schedule abdominal ultrasound for 01/08/15 @ Elvina Sidle MD visit in April

## 2014-12-01 NOTE — Patient Instructions (Addendum)
Please try to bring all your medicines next time. This will help [place] keep you safe from mistakes.  Diclofenac skin gel What is this medicine? DICLOFENAC (dye KLOE fen ak) is a non-steroidal anti-inflammatory drug (NSAID). The 1% skin gel is used to treat osteoarthritis of the hands or knees. The 3% skin gel is used to treat actinic keratosis. This medicine may be used for other purposes; ask your health care provider or pharmacist if you have questions. COMMON BRAND NAME(S): Solaraze, Voltaren Gel  What should I tell my health care provider before I take this medicine? They need to know if you have any of these conditions: -asthma -bleeding problems -coronary artery bypass graft (CABG) surgery within the past 2 weeks -heart disease -high blood pressure -if you frequently drink alcohol containing drinks -kidney disease -liver disease -open or infected skin -stomach problems -an unusual or allergic reaction to diclofenac, aspirin, other NSAIDs, other medicines, benzyl alcohol (3% gel only), foods, dyes, or preservatives -pregnant or trying to get pregnant -breast-feeding  How should I use this medicine? This medicine is for external use only. Follow the directions on the prescription label. Wash hands before and after use. Do not get this medicine in your eyes. If you do, rinse out with plenty of cool tap water. Use your doses at regular intervals. Do not use your medicine more often than directed. A special MedGuide will be given to you by the pharmacist with each prescription and refill of the 1% gel. Be sure to read this information carefully each time. Talk to your pediatrician regarding the use of this medicine in children. Special care may be needed. The 3% gel is not approved for use in children. Overdosage: If you think you have taken too much of this medicine contact a poison control center or emergency room at once. NOTE: This medicine is only for you. Do not share this medicine  with others.  What if I miss a dose? If you miss a dose, use it as soon as you can. If it is almost time for your next dose, use only that dose. Do not use double or extra doses. What may interact with this medicine? -aspirin -NSAIDs, medicines for pain and inflammation, like ibuprofen or naproxen Do not use any other skin products without telling your doctor or health care professional. This list may not describe all possible interactions. Give your health care provider a list of all the medicines, herbs, non-prescription drugs, or dietary supplements you use. Also tell them if you smoke, drink alcohol, or use illegal drugs. Some items may interact with your medicine.  What should I watch for while using this medicine? Tell your doctor or healthcare professional if your symptoms do not start to get better or if they get worse. You will need to follow up with your health care provider to monitor your progress. You may need to be treated for up to 3 months if you are using the 3% gel, but the full effect may not occur until 1 month after stopping treatment. If you develop a severe skin reaction, contact your doctor or health care professional immediately. This medicine can make you more sensitive to the sun. Keep out of the sun. If you cannot avoid being in the sun, wear protective clothing and use sunscreen. Do not use sun lamps or tanning beds/booths. Do not take medicines such as ibuprofen and naproxen with this medicine. Side effects such as stomach upset, nausea, or ulcers may be more likely to  occur. Many medicines available without a prescription should not be taken with this medicine. This medicine does not prevent heart attack or stroke. In fact, this medicine may increase the chance of a heart attack or stroke. The chance may increase with longer use of this medicine and in people who have heart disease. If you take aspirin to prevent heart attack or stroke, talk with your doctor or health  care professional. This medicine can cause ulcers and bleeding in the stomach and intestines at any time during treatment. Do not smoke cigarettes or drink alcohol. These increase irritation to your stomach and can make it more susceptible to damage from this medicine. Ulcers and bleeding can happen without warning symptoms and can cause death. You may get drowsy or dizzy. Do not drive, use machinery, or do anything that needs mental alertness until you know how this medicine affects you. Do not stand or sit up quickly, especially if you are an older patient. This reduces the risk of dizzy or fainting spells. This medicine can cause you to bleed more easily. Try to avoid damage to your teeth and gums when you brush or floss your teeth.  What side effects may I notice from receiving this medicine? Side effects that you should report to your doctor or health care professional as soon as possible: -allergic reactions like skin rash, itching or hives, swelling of the face, lips, or tongue -black or bloody stools, blood in the urine or vomit -blurred vision -chest pain -difficulty breathing or wheezing -nausea or vomiting -redness, blistering, peeling or loosening of the skin, including inside the mouth -slurred speech or weakness on one side of the body -trouble passing urine or change in the amount of urine -unexplained weight gain or swelling -unusually weak or tired -yellowing of eyes or skin Side effects that usually do not require medical attention (report to your doctor or health care professional if they continue or are bothersome): -dizziness -dry skin -headache -heartburn -increased sensitivity to the sun -stomach pain -tingling at the application site This list may not describe all possible side effects. Call your doctor for medical advice about side effects. You may report side effects to FDA at 1-800-FDA-1088.  Where should I keep my medicine? Keep out of the reach of  children. Store the 1% gel at room temperature between 15 and 30 degrees C (59 and 86 degrees F). Store the 3% gel at room temperature between 20 and 25 degrees C (68 and 77 degrees F). Protect from light. Throw away any unused medicine after the expiration date. NOTE: This sheet is a summary. It may not cover all possible information. If you have questions about this medicine, talk to your doctor, pharmacist, or health care provider.  2015, Elsevier/Gold Standard. (2008-02-10 16:35:07)

## 2014-12-01 NOTE — Assessment & Plan Note (Addendum)
Overview -Reports pain is improved somewhat since last visit.  -Flexeril is of help though it sedates him much more than he would like. -Pain is intermittent and at rest and is not associated with any particular activity though he has noted it when he turns his head to the right while shaving.  -Denies any change in the nature of the pain, recent surgery, recent trauma, any new numbness or tingling, recent changes in his sleeping arrangement (bed, pillow).  Assessment -Spasm suspect though unsure why his pain has continued to persist -As prior visit, pain was more consistent along the distribution of his trapezius muscle though today it appears it is more consistent with involvement of the left lateral neck on his left side -Lack of neurologic symptoms makes any kind of nerve impingement less likely  Plan -Check C-spine x-ray today -Try Voltaren gel 2 g 4 times a day over the affected area -Refer to sports medicine if his pain persists  ADDENDUM 12/02/2014  5:32 PM:  Spoke to patient and he is agreeable with current therapy. Reassured him that x-ray was unremarkable for an acute fracture. Also advised him to follow with sports medicine should his pain continue to persist to which he is agreeable

## 2014-12-01 NOTE — Progress Notes (Signed)
   Subjective:    Patient ID: Carin Hock, male    DOB: [birth year], 44 y.o.   MRN: [id number]  HPI Mr. 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 Please see assessment & plan for documentation of each problem.   Review of Systems  Constitutional: Negative for fever.  Musculoskeletal: Positive for neck pain (Left lateral neck).  Neurological: Negative for dizziness, weakness and numbness.       Objective:   Physical Exam  Constitutional: He appears well-developed and well-nourished. No distress.  Neck: Normal range of motion. Muscular tenderness (With deep palpation) present. No rigidity. Normal range of motion present.    Skin: He is not diaphoretic.          Assessment & Plan:

## 2014-12-01 NOTE — Progress Notes (Signed)
INTERNAL MEDICINE TEACHING ATTENDING ADDENDUM - Jrue Yambao, MD: I reviewed and discussed at the time of visit with the resident Dr. Patel, the patient's medical history, physical examination, diagnosis and results of pertinent tests and treatment and I agree with the patient's care as documented.  

## 2014-12-02 NOTE — Addendum Note (Signed)
Addended by: Riccardo Dubin on: 12/02/2014 05:33 PM   Modules accepted: Miquel Dunn

## 2014-12-04 ENCOUNTER — Telehealth: Payer: Self-pay | Admitting: Internal Medicine

## 2014-12-04 MED ORDER — HYDROCODONE-ACETAMINOPHEN 5-325 MG PO TABS: 1.0000 | ORAL_TABLET | Freq: Two times a day (BID) | ORAL | Status: DC | PRN

## 2014-12-04 NOTE — Telephone Encounter (Signed)
He called me this afternoon requesting an early refill of his pain medication. He reports that the Voltaren gel is helping minimally that his pain is still bothering him in the neck. I replied to him that I would need to speak with the provider who is giving his current pain medication, Dr. Beryle Beams, to see if he is agreeable to giving a short dose of pain medication until he gets his monthly dose refilled on 2/19. Per the [place], I do not see any red flags in the last 6 months As indicated by prescriptions from multiple providers. Upon speaking with Dr. Beryle Beams, we are agreeable to prescribing him a limited apply pain medication of 12 tablets since requires 2 tablets per day until he can get his next refill of medication on the 19th. He is agreeable to this and thanks [place] for our efforts.  I attempted to call this prescription in to his pharmacy, but was informed that Vicodin is now a schedule 2 medication. As a result, I have printed the prescription and will wait for the patient to come pick it up in the next 30 minutes.

## 2014-12-08 ENCOUNTER — Other Ambulatory Visit: Payer: Self-pay | Admitting: *Deleted

## 2014-12-08 NOTE — Telephone Encounter (Signed)
Due on the 19th.

## 2014-12-09 ENCOUNTER — Other Ambulatory Visit: Payer: Self-pay | Admitting: Oncology

## 2014-12-09 MED ORDER — HYDROCODONE-ACETAMINOPHEN 5-325 MG PO TABS: 1.0000 | ORAL_TABLET | Freq: Four times a day (QID) | ORAL | Status: DC | PRN

## 2014-12-09 MED ORDER — HYDROCODONE-ACETAMINOPHEN 5-325 MG PO TABS: 1.0000 | ORAL_TABLET | Freq: Two times a day (BID) | ORAL | Status: DC | PRN

## 2014-12-10 ENCOUNTER — Encounter: Payer: BLUE CROSS/BLUE SHIELD | Admitting: Internal Medicine

## 2014-12-10 ENCOUNTER — Telehealth: Payer: Self-pay | Admitting: Family Medicine

## 2014-12-10 NOTE — Telephone Encounter (Signed)
His prior exams by his PCP suggest it's a neck issue and not a shoulder issue - if that's the case he will not get a cortisone shot.  If it was his shoulder and we did a shot there he should still be able to drive home.

## 2014-12-10 NOTE — Telephone Encounter (Signed)
Spoke with patient and told him that after his exam if it is a neck issue then he would not get a cortisone shot. If it is his shoulder and he gets a cortisone injection, he should be able to drive himself home.

## 2014-12-11 ENCOUNTER — Encounter: Payer: Self-pay | Admitting: Family Medicine

## 2014-12-11 ENCOUNTER — Encounter (INDEPENDENT_AMBULATORY_CARE_PROVIDER_SITE_OTHER): Payer: Self-pay

## 2014-12-11 ENCOUNTER — Ambulatory Visit (INDEPENDENT_AMBULATORY_CARE_PROVIDER_SITE_OTHER): Payer: BLUE CROSS/BLUE SHIELD | Admitting: Family Medicine

## 2014-12-11 VITALS — BP 130/83 | HR 80 | Ht 73.0 in | Wt 220.0 lb

## 2014-12-11 DIAGNOSIS — M5010 Cervical disc disorder with radiculopathy, unspecified cervical region: Secondary | ICD-10-CM

## 2014-12-11 DIAGNOSIS — M542 Cervicalgia: Secondary | ICD-10-CM

## 2014-12-11 MED ORDER — MELOXICAM 15 MG PO TABS: 15.0000 mg | ORAL_TABLET | Freq: Every day | ORAL | Status: DC

## 2014-12-11 MED ORDER — PREDNISONE (PAK) 10 MG PO TABS: ORAL_TABLET | ORAL | Status: DC

## 2014-12-11 NOTE — Patient Instructions (Signed)
You either have cervical radiculopathy (a pinched nerve in the neck) or pure cervical/trapezius spasms - both treated similarly. Prednisone 6 day dose pack to relieve irritation/inflammation of the nerve. Meloxicam 15mg  daily with food for pain and inflammation - start day AFTER finishing prednisone if prescribed this. Tramadol as needed for severe pain (no driving on this medicine). Consider cervical collar if severely painful. Simple range of motion exercises within limits of pain to prevent further stiffness. Start physical therapy for stretching, exercises, traction, and modalities. Heat 15 minutes at a time 3-4 times a day to help with spasms. Watch head position when on computers, texting, when sleeping in bed - should in line with back to prevent further nerve traction and irritation. Consider home traction unit if you get benefit with this in physical therapy. If not improving we will consider an MRI. Follow up with me in 4 weeks. Call me if not improving though over the next week.

## 2014-12-14 ENCOUNTER — Encounter: Payer: Self-pay | Admitting: Family Medicine

## 2014-12-14 ENCOUNTER — Telehealth: Payer: Self-pay | Admitting: *Deleted

## 2014-12-14 ENCOUNTER — Encounter: Payer: Self-pay | Admitting: Internal Medicine

## 2014-12-14 DIAGNOSIS — M542 Cervicalgia: Secondary | ICD-10-CM | POA: Insufficient documentation

## 2014-12-14 HISTORY — DX: Cervicalgia: M54.2

## 2014-12-14 NOTE — Assessment & Plan Note (Signed)
2/2 pure spasms of trapezius/cervical paraspinal muscles vs cervical radiculopathy.  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  Start with conservative treatment.  Prednisone dose pack with transition to meloxicam.  Did not prescribe tramadol or other narcotic after reviewing narcotics database - he filled norco just 2 days ago from Dr. Beryle Beams.  Will start physical therapy and home exercises.  Advised to call me if not improving through the next week and would consider MRI.

## 2014-12-14 NOTE — Telephone Encounter (Signed)
Pt walked in to clinic - needs a note to be out of work 12/13/14 to 12/17/14. Works at [employer] and does lifting. Dr Posey Pronto aware. Hilda Blades Jacinda Kanady RN 12/14/14 12N

## 2014-12-14 NOTE — Telephone Encounter (Signed)
I signed a work note for him. I encouraged him to continue following up with sports medicine for further management of his neck pain. He reports that he has been adherent to the therapy that they prescribed him and is hopeful that things will get better because he is eager to get back to work.

## 2014-12-14 NOTE — Progress Notes (Signed)
PCP and referred by: Charlott Rakes, MD  Subjective:   HPI: Patient is a 44 y.o. male here for neck/left shoulder pain.  Patient reports for about 1 1/2 months he has had left neck/posterior shoulder pain. No known injury. Worse at [employer] and job requires a lot of lifting and twisting. No swelling, bruising. Works about 5 hours a day. Tried flexeril, voltaren gel, tylenol. He thought this came on after sleeping wrong. No numbness/tingling. Bothers him when looking to the right.  Past Medical History  Diagnosis Date  . Asthma, cold induced   . Herpes   . Asthma   . No pertinent past medical history     enlarged spleen  . Spleen enlargement     myofibrosis need bonemarrow transplant  . Myelofibrosis     followed by Dr. Sherryl Manges  . 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 acetaminophen (TYLENOL) 325 MG tablet Take 2 tablets (650 mg total) by mouth every 4 (four) hours as needed. 100 tablet 2  . albuterol (PROVENTIL HFA;VENTOLIN HFA) 108 (90 BASE) MCG/ACT inhaler Inhale 2 puffs into the lungs every 6 (six) hours as needed for wheezing or shortness of breath.     . diclofenac sodium (VOLTAREN) 1 % GEL Apply 2 g topically 4 (four) times daily. 100 g 0  . HYDROcodone-acetaminophen (NORCO/VICODIN) 5-325 MG per tablet Take 1 tablet by mouth every 6 (six) hours as needed for moderate pain. 60 tablet 0  . ruxolitinib phosphate (JAKAFI) 20 MG tablet Take 1 tablet (20 mg total) by mouth 2 (two) times daily. 60 tablet 11  . sulfamethoxazole-trimethoprim (SEPTRA DS) 800-160 MG per tablet Take 1 tablet by mouth 2 (two) times daily. 20 tablet 0   No current facility-administered medications on file prior to visit.    Past Surgical History  Procedure Laterality Date  . Mandible fracture surgery    . Mandibular  hardware removal  04/10/2012    Procedure: MANDIBULAR HARDWARE REMOVAL;  Surgeon: Jodi Marble, MD;  Location: [facility];  Service: ENT;  Laterality: Right;    Allergies  Allergen Reactions  . Ibuprofen Other (See Comments)    Indigestion    History   Social History  . Marital Status: Single    Spouse Name: N/A  . Number of Children: N/A  . Years of Education: N/A   Occupational History  . Not on file.   Social History Main Topics  . Smoking status: Current Some Day Smoker -- 0.20 packs/day    Types: Cigarettes  . Smokeless tobacco: Not on file     Comment: 2 per day  . Alcohol Use: 0.0 oz/week    0 Standard drinks or equivalent per week     Comment: daily   . Drug Use: No  . Sexual Activity: Not on file   Other Topics Concern  . Not on file   Social History Narrative    No family history on file.  BP 130/83 mmHg  Pulse 80  Ht 6\' 1"  (1.854 m)  Wt 220 lb (99.791 kg)  BMI 29.03 kg/m2  Review of Systems: See HPI above.    Objective:  Physical Exam:  Gen: NAD  Neck: No gross deformity, swelling, bruising. TTP left cervical paraspinal region and upper thoracic paraspinal region.  No midline/bony TTP. FROM neck - pain on right lateral rotation. BUE strength  5/5.   Sensation intact to light touch.   1+ equal reflexes in triceps, biceps, brachioradialis tendons. Negative spurlings. NV intact distal BUEs.  Left shoulder: No swelling, ecchymoses.  No gross deformity. No TTP. FROM. Negative Hawkins, Neers. Negative Yergasons. Strength 5/5 with empty can and resisted internal/external rotation. Negative apprehension. NV intact distally.    Assessment & Plan:  1. Neck/upper back pain - 2/2 pure spasms of trapezius/cervical paraspinal muscles vs cervical radiculopathy.  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  Start with conservative treatment.  Prednisone dose pack with transition to meloxicam.  Did not prescribe tramadol or other narcotic after reviewing  narcotics database - he filled norco just 2 days ago from Dr. Beryle Beams.  Will start physical therapy and home exercises.  Advised to call me if not improving through the next week and would consider MRI.

## 2014-12-15 ENCOUNTER — Encounter: Payer: Self-pay | Admitting: Internal Medicine

## 2014-12-15 NOTE — Progress Notes (Signed)
Patient ID: [name], male   DOB: 1971-01-08, 44 y.o.   MRN: [id number]  I received paperwork today from the patient regarding family medical leave. I'm not sure nor aware of any medical condition that he has currently would require time off though he did note to me at his last clinic visit that he often requires time off for his splenic pain. Recently, he did develop some left-sided neck pain which is not improved since his last office visit with sports medicine.   I spoke with his hematologist, Dr. Beryle Beams, who also informed me that he is not clear as to why the patient is requesting this time off. He recently went to [facility] for evaluation of a splenic transplant though there've not been any more details regarding this procedure.   Can we please call the patient to find out why he is requesting this time off? FYI, I'm also going to be out for this week and will be starting night float next week.

## 2014-12-16 NOTE — Progress Notes (Signed)
Patient did have FMLA paperwork done last year.  It can be found under media on 08/12/14. Chronic conditions are mentioned.

## 2014-12-16 NOTE — Progress Notes (Signed)
I talked with Sean Walter and he states there was no specific reason for the [facility] paperwork.   He always gets it filled out once a year so If he is not feeling well he will be covered.

## 2014-12-21 ENCOUNTER — Other Ambulatory Visit: Payer: BC Managed Care – PPO

## 2014-12-21 ENCOUNTER — Telehealth: Payer: Self-pay | Admitting: *Deleted

## 2014-12-21 ENCOUNTER — Encounter: Payer: Self-pay | Admitting: Internal Medicine

## 2014-12-21 ENCOUNTER — Encounter: Payer: BLUE CROSS/BLUE SHIELD | Admitting: Internal Medicine

## 2014-12-21 NOTE — Telephone Encounter (Signed)
Pt walked in to office - needs another note to be out of work due to neck pain.  Pt states continue with PT - pt works at [employer] - problems with lifting. Appt made for today 3:45PM Dr Raelene Bott. Pt states he has FMLA papers for neck pain. Hilda Blades Lashawnda Hancox RN 12/21/14 9:45AM

## 2014-12-25 ENCOUNTER — Ambulatory Visit: Payer: BLUE CROSS/BLUE SHIELD | Attending: Family Medicine

## 2014-12-25 DIAGNOSIS — M25512 Pain in left shoulder: Secondary | ICD-10-CM

## 2014-12-25 DIAGNOSIS — M542 Cervicalgia: Secondary | ICD-10-CM | POA: Diagnosis not present

## 2014-12-25 NOTE — Therapy (Signed)
[place], [place], [address] Phone: [phone]   Fax:  [phone]  Physical Therapy Evaluation  Patient Details  Name: Sean Walter MRN: [id number] Date of Birth: Apr 24, 1971 Referring Provider:  Dene Gentry, MD  Encounter Date: 12/25/2014      PT End of Session - 12/25/14 0951    Visit Number 1   Number of Visits 12   Date for PT Re-Evaluation 02/05/15   PT Start Time 0920   PT Stop Time 1000   PT Time Calculation (min) 40 min   Activity Tolerance Patient limited by pain   Behavior During Therapy [facility] for tasks assessed/performed      Past Medical History  Diagnosis Date  . Asthma, cold induced   . Herpes   . Asthma   . No pertinent past medical history     enlarged spleen  . Spleen enlargement     myofibrosis need bonemarrow transplant  . Myelofibrosis     followed by Dr. Sherryl Manges  . 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 Mandible fracture surgery    . Mandibular hardware removal  04/10/2012    Procedure: MANDIBULAR HARDWARE REMOVAL;  Surgeon: Jodi Marble, MD;  Location: [facility];  Service: ENT;  Laterality: Right;    There were no vitals taken for this visit.  Visit Diagnosis:  Pain in neck - Plan: PT plan of care cert/re-cert  Pain in joint, shoulder region, left - Plan: PT plan of care cert/re-cert      Subjective Assessment - 12/25/14 0921    Symptoms He reports pain in LT traps to back of LT neck.    Pertinent History He reports he woke with pain. 6 weeks ago. He has been out of work for 3 weeks.    Limitations House hold activities  Not working. Limiting involvement with children, wakes due to pain   How long can you walk comfortably? Some times pain increases.    Diagnostic tests Xrays negative   Patient Stated Goals Releive  pain   Currently in Pain? Yes   Pain Score 3    Pain Location Shoulder   Pain Orientation Left   Pain Descriptors / Indicators Aching;Throbbing   Pain Type Acute pain   Pain Onset More than a month ago   Pain Frequency Constant   Aggravating Factors  Maintaining position and activity with arm   Pain Relieving Factors Nothing   Effect of Pain on Daily Activities Limited with all activity   Multiple Pain Sites No          [facility] PT Assessment - 12/25/14 0927    Assessment   Medical Diagnosis Cervical and LT shoulder pain.    Onset Date --  6 weeks   Next MD Visit 12/26/14   Precautions   Precautions None   Restrictions   Weight Bearing Restrictions No   Balance Screen   Has the patient fallen in the past 6 months No   Has the patient had a decrease in activity level because of a fear of falling?  No   Is the patient reluctant to leave their home because of a fear of falling?  No   Posture/Postural Control   Posture Comments Rounded shoulder and    ROM / Strength   AROM / PROM / Strength AROM;Strength;PROM   AROM  Overall AROM Comments Shoulder motion normal   AROM Assessment Site Shoulder;Cervical   Cervical Flexion 55   Cervical Extension 33   Cervical - Right Side Bend 45   Cervical - Left Side Bend 45   Cervical - Right Rotation 75   Cervical - Left Rotation 75   Strength   Overall Strength Comments Strength in normal                  [facility] Adult PT Treatment/Exercise - 12/25/14 0927    Modalities   Modalities Traction   Traction   Type of Traction Cervical   Min (lbs) 5   Max (lbs) 14   Hold Time 60   Rest Time 15   Time 12                PT Education - 12/25/14 0950    Education provided Yes   Education Details POC and awareness of pain later today   Person(s) Educated Patient   Methods Explanation   Comprehension Verbalized understanding          PT Short Term Goals - 12/25/14 0916    PT SHORT TERM GOAL #1   Title He will  be indepndent with inital HEP   Time 3   Period Weeks   Status New   PT SHORT TERM GOAL #2   Title He will report pain decreased 30% or more  with home activity and assisting children   Time 3   Period Weeks   Status New           PT Long Term Goals - 12/25/14 4193    PT LONG TERM GOAL #1   Title He will be independent with all HEP issued as of last visit   Time 6   Period Weeks   Status New   PT LONG TERM GOAL #2   Title He will report pain decreased 75% or more with work tasks    PT [place] #3   Title He will report able to feed child without pain   Time 6   Period Weeks   Status New               Plan - 12/25/14 7902    Clinical Impression Statement No restrictions and good mobility . Pain increased with testing . Tender LT traps and paraspinals. Mild posture changes but dont appear for now an issue related to pain   Pt will benefit from skilled therapeutic intervention in order to improve on the following deficits Postural dysfunction;Pain;Impaired UE functional use;Decreased activity tolerance   Rehab Potential Good   PT Frequency 2x / week   PT Duration 4 weeks  6 weeks if improving   PT Treatment/Interventions Moist Heat;Ultrasound;Electrical Stimulation;Traction;Therapeutic exercise;Manual techniques;Patient/family education   PT Next Visit Plan Modalities, manual, postural education   PT Home Exercise Plan Posture ed.    Consulted and Agree with Plan of Care Patient         Problem List Patient Active Problem List   Diagnosis Date Noted  . Neck pain 12/14/2014  . Left shoulder pain 11/24/2014  . Folliculitis 40/97/3532  . Lower back pain 10/27/2014  . 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 Primary myelofibrosis   . Spleen enlargement     Darrel Hoover PT 12/25/2014, 9:57 AM  [facility] [address] [place], [place], [address] Phone: [phone]   Fax:  [phone]

## 2014-12-29 ENCOUNTER — Other Ambulatory Visit (INDEPENDENT_AMBULATORY_CARE_PROVIDER_SITE_OTHER): Payer: BLUE CROSS/BLUE SHIELD

## 2014-12-29 DIAGNOSIS — D47Z9 Other specified neoplasms of uncertain behavior of lymphoid, hematopoietic and related tissue: Secondary | ICD-10-CM

## 2014-12-29 DIAGNOSIS — R161 Splenomegaly, not elsewhere classified: Secondary | ICD-10-CM

## 2014-12-29 DIAGNOSIS — D471 Chronic myeloproliferative disease: Secondary | ICD-10-CM

## 2014-12-29 DIAGNOSIS — C946 Myelodysplastic disease, not classified: Secondary | ICD-10-CM

## 2014-12-29 LAB — CBC WITH DIFFERENTIAL/PLATELET
BASOS ABS: 0 10*3/uL (ref 0.0–0.1)
BASOS PCT: 0 % (ref 0–1)
EOS ABS: 0.1 10*3/uL (ref 0.0–0.7)
Eosinophils Relative: 1 % (ref 0–5)
HEMATOCRIT: 40.9 % (ref 39.0–52.0)
HEMOGLOBIN: 13.5 g/dL (ref 13.0–17.0)
LYMPHS ABS: 0.3 10*3/uL — AB (ref 0.7–4.0)
Lymphocytes Relative: 5 % — ABNORMAL LOW (ref 12–46)
MCH: 26.2 pg (ref 26.0–34.0)
MCHC: 33 g/dL (ref 30.0–36.0)
MCV: 79.4 fL (ref 78.0–100.0)
MONOS PCT: 3 % (ref 3–12)
Monocytes Absolute: 0.2 10*3/uL (ref 0.1–1.0)
NEUTROS ABS: 6 10*3/uL (ref 1.7–7.7)
NEUTROS PCT: 91 % — AB (ref 43–77)
Platelets: 224 10*3/uL (ref 150–400)
RBC: 5.15 MIL/uL (ref 4.22–5.81)
RDW: 17.7 % — AB (ref 11.5–15.5)
WBC: 6.6 10*3/uL (ref 4.0–10.5)

## 2014-12-29 LAB — COMPREHENSIVE METABOLIC PANEL
ALBUMIN: 4.5 g/dL (ref 3.5–5.2)
ALT: 33 U/L (ref 0–53)
AST: 20 U/L (ref 0–37)
Alkaline Phosphatase: 60 U/L (ref 39–117)
BUN: 12 mg/dL (ref 6–23)
CO2: 26 meq/L (ref 19–32)
CREATININE: 1.22 mg/dL (ref 0.50–1.35)
Calcium: 9.2 mg/dL (ref 8.4–10.5)
Chloride: 100 mEq/L (ref 96–112)
Glucose, Bld: 111 mg/dL — ABNORMAL HIGH (ref 70–99)
POTASSIUM: 4.2 meq/L (ref 3.5–5.3)
SODIUM: 136 meq/L (ref 135–145)
TOTAL PROTEIN: 7.3 g/dL (ref 6.0–8.3)
Total Bilirubin: 0.5 mg/dL (ref 0.2–1.2)

## 2014-12-29 LAB — URIC ACID: URIC ACID, SERUM: 6.1 mg/dL (ref 4.0–7.8)

## 2014-12-29 LAB — LACTATE DEHYDROGENASE: LDH: 256 U/L — ABNORMAL HIGH (ref 94–250)

## 2014-12-30 ENCOUNTER — Ambulatory Visit: Payer: BLUE CROSS/BLUE SHIELD

## 2014-12-30 DIAGNOSIS — M542 Cervicalgia: Secondary | ICD-10-CM | POA: Diagnosis not present

## 2014-12-30 NOTE — Patient Instructions (Signed)
Issued and instructed in level 2 stabilization with arm movements 10-20 reps 1-2x/day

## 2014-12-30 NOTE — Progress Notes (Signed)
Patient ID: Sean Walter, male   DOB: [birth year], 44 y.o.   MRN: [id number]  Upon speaking with Dr. Luciano Cutter, I am comfortable completing his FMLA paperwork in anticipation for possible splenic transplant sometime this year.   Edd Fabian, I will leave this paperwork with you as I'm not sure if anything else needs to be mailed along with it in the provided envelope.  Please let me know if you need anything else. Thank you.

## 2014-12-30 NOTE — Therapy (Signed)
[place], [place], [address] Phone: [phone]   Fax:  [phone]  Physical Therapy Treatment  Patient Details  Name: Sean Walter MRN: [id number] Date of Birth: 07-31-1971 Referring Provider:  Riccardo Dubin, MD  Encounter Date: 12/30/2014      PT End of Session - 12/30/14 0737    Visit Number 2   Number of Visits 12   Date for PT Re-Evaluation 02/05/15   PT Start Time 0704   PT Stop Time 0808   PT Time Calculation (min) 64 min   Activity Tolerance Patient tolerated treatment well   Behavior During Therapy [facility] for tasks assessed/performed      Past Medical History  Diagnosis Date  . Asthma, cold induced   . Herpes   . Asthma   . No pertinent past medical history     enlarged spleen  . Spleen enlargement     myofibrosis need bonemarrow transplant  . Myelofibrosis     followed by Dr. Sherryl Manges  . 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 Mandible fracture surgery    . Mandibular hardware removal  04/10/2012    Procedure: MANDIBULAR HARDWARE REMOVAL;  Surgeon: Jodi Marble, MD;  Location: [facility];  Service: ENT;  Laterality: Right;    There were no vitals taken for this visit.  Visit Diagnosis:  Pain in neck      Subjective Assessment - 12/30/14 0707    Symptoms No problems with traction no changes.    Currently in Pain? Yes   Pain Score 3    Multiple Pain Sites No                    [facility] Adult PT Treatment/Exercise - 12/30/14 0707    Exercises   Exercises Neck   Neck Exercises: Supine   Other Supine Exercise Level 2 stab exercises   Modalities   Modalities Ultrasound   Ultrasound   Ultrasound Location LT neck ,,    Ultrasound Parameters 100% , 1.6Wcm2, 1MHz   Ultrasound Goals Pain   Traction   Type of Traction Cervical   Min (lbs) 5   Max (lbs) 16   Hold Time 60sec   Rest Time 15 sec   Time 15   Manual Therapy   Manual Therapy Massage;Myofascial release                PT Education - 12/30/14 0738    Education Details posture ed with use of back support and reason for neck posture          PT Short Term Goals - 12/25/14 0916    PT SHORT TERM GOAL #1   Title He will be indepndent with inital HEP   Time 3   Period Weeks   Status New   PT SHORT TERM GOAL #2   Title He will report pain decreased 30% or more  with home activity and assisting children   Time 3   Period Weeks   Status New           PT Long Term Goals - 12/25/14 4008    PT LONG TERM GOAL #1   Title He will be independent with all HEP issued as of last visit   Time 6   Period Weeks   Status New   PT LONG TERM GOAL #  2   Title He will report pain decreased 75% or more with work tasks    PT [place] #3   Title He will report able to feed child without pain   Time 6   Period Weeks   Status New               Plan - 12/30/14 0737    Clinical Impression Statement No changes but tolerated treatment without increased pain.    PT Next Visit Plan Continue modalities and manual   Consulted and Agree with Plan of Care Patient        Problem List Patient Active Problem List   Diagnosis Date Noted  . Neck pain 12/14/2014  . Left shoulder pain 11/24/2014  . Folliculitis 27/61/4709  . Lower back pain 10/27/2014  . 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 Primary myelofibrosis   . Spleen enlargement     Darrel Hoover PT 12/30/2014, 7:40 AM  [facility] [address] [place], [place], [address] Phone: [phone]   Fax:  [phone]

## 2014-12-31 ENCOUNTER — Telehealth: Payer: Self-pay | Admitting: *Deleted

## 2014-12-31 NOTE — Telephone Encounter (Signed)
-----   Message from [name], MD sent at 12/30/2014  1:57 PM EST ----- Call pt: blood counts good; stay on current dose of Jakafi

## 2014-12-31 NOTE — Telephone Encounter (Signed)
Pt called / informed labs are good and to continue current dose of Jakafi per Dr Beryle Beams. Pt voiced understanding.

## 2015-01-01 ENCOUNTER — Encounter: Payer: Self-pay | Admitting: Internal Medicine

## 2015-01-01 ENCOUNTER — Ambulatory Visit (INDEPENDENT_AMBULATORY_CARE_PROVIDER_SITE_OTHER): Payer: BLUE CROSS/BLUE SHIELD | Admitting: Internal Medicine

## 2015-01-01 VITALS — BP 147/90 | HR 85 | Temp 98.0°F | Wt 220.9 lb

## 2015-01-01 DIAGNOSIS — M25512 Pain in left shoulder: Secondary | ICD-10-CM | POA: Diagnosis not present

## 2015-01-01 NOTE — Progress Notes (Signed)
Case discussed with Dr. Kennerly at the time of the visit.  We reviewed the resident's history and exam and pertinent patient test results.  I agree with the assessment, diagnosis, and plan of care documented in the resident's note. 

## 2015-01-01 NOTE — Patient Instructions (Signed)
-  Continue physical therapy as scheduled.  -Follow up with Dr. Posey Pronto as needed.

## 2015-01-01 NOTE — Assessment & Plan Note (Addendum)
His pain is persistent but it is improving with physical therapy. Short-term disability form filled today and given to pt (copy to be scanned into Epic).   He will call for more refills on   Vicodin 5-325mg  q6hr PRN for pain

## 2015-01-01 NOTE — Progress Notes (Signed)
   Subjective:    Patient ID: Sean Walter, male    DOB: 1971-07-26, 44 y.o.   MRN: [id number]  HPI Mr. 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 He sates that since last month he has had this left shoulder pain that is constant and worse with lifting/arm raise and when looking to the right. He works at [employer] and states that he has not been back to work since then, unable to lift items due to the pain. The pain is described as having coming on after "sleeping wrong". He has no numbness or tingling. He has been seen by Sports Medicine and PT/Rehab.   Review of Systems     Objective:   Physical Exam        Assessment & Plan:

## 2015-01-04 ENCOUNTER — Other Ambulatory Visit: Payer: Self-pay | Admitting: *Deleted

## 2015-01-04 ENCOUNTER — Ambulatory Visit: Payer: BLUE CROSS/BLUE SHIELD

## 2015-01-04 DIAGNOSIS — M542 Cervicalgia: Secondary | ICD-10-CM

## 2015-01-04 NOTE — Therapy (Signed)
[place], [place], [address] Phone: [phone]   Fax:  [phone]  Physical Therapy Treatment  Patient Details  Name: Sean Walter MRN: [id number] Date of Birth: 31-Dec-1970 Referring Provider:  Riccardo Dubin, MD  Encounter Date: 01/04/2015      PT End of Session - 01/04/15 0829    Visit Number 3   Number of Visits 12   Date for PT Re-Evaluation 02/05/15   PT Start Time 0755   PT Stop Time 0855   PT Time Calculation (min) 60 min   Activity Tolerance Patient tolerated treatment well   Behavior During Therapy [facility] for tasks assessed/performed      Past Medical History  Diagnosis Date  . Asthma, cold induced   . Herpes   . Asthma   . No pertinent past medical history     enlarged spleen  . Spleen enlargement     myofibrosis need bonemarrow transplant  . Myelofibrosis     followed by Dr. Sherryl Manges  . 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 Mandible fracture surgery    . Mandibular hardware removal  04/10/2012    Procedure: MANDIBULAR HARDWARE REMOVAL;  Surgeon: Jodi Marble, MD;  Location: [facility];  Service: ENT;  Laterality: Right;    There were no vitals filed for this visit.  Visit Diagnosis:  Pain in neck      Subjective Assessment - 01/04/15 0800    Symptoms He rpeorts some better. OK with treatment last visit   Currently in Pain? Yes   Pain Score 2    Multiple Pain Sites No                       [facility] Adult PT Treatment/Exercise - 01/04/15 0801    Moist Heat Therapy   Number Minutes Moist Heat 15 Minutes   Moist Heat Location --  neck   Ultrasound   Ultrasound Location LT neck   Ultrasound Parameters 100% 1.6 Wcm2, 1MHZ   Ultrasound Goals Pain   Traction   Type of Traction Cervical   Min (lbs) 5   Max (lbs) 18   Hold Time 60   Rest Time 15 sec   Time 15   Manual Therapy   Manual Therapy Massage;Myofascial release   Myofascial Release STW to LT neck and trap    PA mobs Gr 3 LT cervical spine              PT Short Term Goals - 12/25/14 0916    PT SHORT TERM GOAL #1   Title He will be indepndent with inital HEP   Time 3   Period Weeks   Status New   PT SHORT TERM GOAL #2   Title He will report pain decreased 30% or more  with home activity and assisting children   Time 3   Period Weeks   Status New           PT Long Term Goals - 12/25/14 9357    PT LONG TERM GOAL #1   Title He will be independent with all HEP issued as of last visit   Time 6   Period Weeks   Status New   PT LONG TERM GOAL #2   Title He will report pain decreased 75% or more with work tasks  PT LONG TERM GOAL #3   Title He will report able to feed child without pain   Time 6   Period Weeks   Status New               Plan - 01/04/15 0830    Clinical Impression Statement Pain levels better. Appears to be responding to treatment   PT Frequency 2x / week   PT Duration 3 weeks   PT Next Visit Plan Continue same treatment. May add stab exercises   Consulted and Agree with Plan of Care Patient        Problem List Patient Active Problem List   Diagnosis Date Noted  . Neck pain 12/14/2014  . Left shoulder pain 11/24/2014  . Folliculitis 75/64/3329  . Lower back pain 10/27/2014  . 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 Primary myelofibrosis   . Spleen enlargement     Darrel Hoover PT 01/04/2015, 8:32 AM  [facility] [address] [place], [place], [address] Phone: [phone]   Fax:  [phone]

## 2015-01-05 MED ORDER — HYDROCODONE-ACETAMINOPHEN 5-325 MG PO TABS: 1.0000 | ORAL_TABLET | Freq: Four times a day (QID) | ORAL | Status: DC | PRN

## 2015-01-05 NOTE — Telephone Encounter (Signed)
As this patient was seen by Dr. Beryle Beams on 12/01/14 and deemed necessary for their treatment as documented in the EHR, I will refill this prescription for Norco 5/325mg  x 60 tabs.

## 2015-01-06 ENCOUNTER — Ambulatory Visit: Payer: BLUE CROSS/BLUE SHIELD

## 2015-01-06 DIAGNOSIS — M542 Cervicalgia: Secondary | ICD-10-CM | POA: Diagnosis not present

## 2015-01-06 NOTE — Telephone Encounter (Signed)
Pt.notified

## 2015-01-06 NOTE — Therapy (Signed)
[place], [place], [address] Phone: [phone]   Fax:  [phone]  Physical Therapy Treatment  Patient Details  Name: DAYVIN ABER MRN: [id number] Date of Birth: 1971-01-14 Referring Provider:  Riccardo Dubin, MD  Encounter Date: 01/06/2015      PT End of Session - 01/06/15 0830    Visit Number 4   Number of Visits 12   Date for PT Re-Evaluation 02/05/15   PT Start Time 0755   PT Stop Time 0900   PT Time Calculation (min) 65 min   Activity Tolerance Patient tolerated treatment well   Behavior During Therapy [facility] for tasks assessed/performed      Past Medical History  Diagnosis Date  . Asthma, cold induced   . Herpes   . Asthma   . No pertinent past medical history     enlarged spleen  . Spleen enlargement     myofibrosis need bonemarrow transplant  . Myelofibrosis     followed by Dr. Sherryl Manges  . 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 Mandible fracture surgery    . Mandibular hardware removal  04/10/2012    Procedure: MANDIBULAR HARDWARE REMOVAL;  Surgeon: Jodi Marble, MD;  Location: [facility];  Service: ENT;  Laterality: Right;    There were no vitals filed for this visit.  Visit Diagnosis:  Pain in neck      Subjective Assessment - 01/06/15 0800    Symptoms OK today still with pain   Currently in Pain? Yes   Pain Score 2    Multiple Pain Sites No                       [facility] Adult PT Treatment/Exercise - 01/06/15 0828    Neck Exercises: Machines for Strengthening   UBE (Upper Arm Bike) L@  min then pain increased   Moist Heat Therapy   Number Minutes Moist Heat 15 Minutes   Moist Heat Location --  neck   Ultrasound   Ultrasound Location LT neck and traps   Ultrasound Parameters 100% , 1.7 Wcm2, 1 MHz   Ultrasound  Goals Pain   Traction   Type of Traction Cervical   Min (lbs) 5   Max (lbs) 18   Hold Time 60   Rest Time 15 sec   Time 15   Manual Therapy   Myofascial Release STW to LT neck and trap                  PT Short Term Goals - 12/25/14 0916    PT SHORT TERM GOAL #1   Title He will be indepndent with inital HEP   Time 3   Period Weeks   Status New   PT SHORT TERM GOAL #2   Title He will report pain decreased 30% or more  with home activity and assisting children   Time 3   Period Weeks   Status New           PT Long Term Goals - 12/25/14 7893    PT LONG TERM GOAL #1   Title He will be independent with all HEP issued as of last visit   Time 6   Period Weeks   Status New   PT LONG TERM GOAL #2   Title He will report pain decreased  75% or more with work tasks    PT [place] #3   Title He will report able to feed child without pain   Time 6   Period Weeks   Status New               Plan - 01/06/15 0830    Clinical Impression Statement Still wth decreased tolerance to UE use  though generally pain improved. Will monitor activity tolerance with UBE   PT Next Visit Plan Continue manual and modalities . TRY NEURAL GLIDES   Consulted and Agree with Plan of Care Patient        Problem List Patient Active Problem List   Diagnosis Date Noted  . Neck pain 12/14/2014  . Left shoulder pain 11/24/2014  . Folliculitis 34/91/7915  . Lower back pain 10/27/2014  . 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 Primary myelofibrosis   . Spleen enlargement     Darrel Hoover PT 01/06/2015, 8:33 AM  [facility] [address] [place], [place], [address] Phone: [phone]   Fax:  [phone]

## 2015-01-08 ENCOUNTER — Ambulatory Visit (HOSPITAL_COMMUNITY)
Admission: RE | Admit: 2015-01-08 | Discharge: 2015-01-08 | Disposition: A | Discharge status: Home / Self Care | Payer: BLUE CROSS/BLUE SHIELD | Source: Ambulatory Visit | Attending: Oncology | Admitting: Oncology

## 2015-01-08 ENCOUNTER — Ambulatory Visit: Payer: BLUE CROSS/BLUE SHIELD | Admitting: Family Medicine

## 2015-01-08 ENCOUNTER — Ambulatory Visit (HOSPITAL_COMMUNITY): Payer: BLUE CROSS/BLUE SHIELD

## 2015-01-08 DIAGNOSIS — R161 Splenomegaly, not elsewhere classified: Secondary | ICD-10-CM

## 2015-01-08 DIAGNOSIS — D47Z9 Other specified neoplasms of uncertain behavior of lymphoid, hematopoietic and related tissue: Secondary | ICD-10-CM

## 2015-01-08 DIAGNOSIS — D471 Chronic myeloproliferative disease: Secondary | ICD-10-CM

## 2015-01-08 DIAGNOSIS — Z8619 Personal history of other infectious and parasitic diseases: Secondary | ICD-10-CM | POA: Diagnosis not present

## 2015-01-08 DIAGNOSIS — C946 Myelodysplastic disease, not classified: Secondary | ICD-10-CM

## 2015-01-08 DIAGNOSIS — D7581 Myelofibrosis: Secondary | ICD-10-CM | POA: Diagnosis not present

## 2015-01-13 ENCOUNTER — Ambulatory Visit: Payer: BLUE CROSS/BLUE SHIELD

## 2015-01-18 ENCOUNTER — Ambulatory Visit: Payer: BLUE CROSS/BLUE SHIELD

## 2015-01-18 ENCOUNTER — Other Ambulatory Visit: Payer: BC Managed Care – PPO

## 2015-01-20 ENCOUNTER — Telehealth: Payer: Self-pay | Admitting: *Deleted

## 2015-01-20 ENCOUNTER — Ambulatory Visit: Payer: BLUE CROSS/BLUE SHIELD

## 2015-01-20 NOTE — Telephone Encounter (Signed)
  Call from [facility] asking for medication clarification on pt's jakafi rx.  Per pharmacy, pt informed them that he was taking 10 mg tabs twice daily and not the 20 mg tabs twice daily.  Pharmacy is requesting a new prescription to reflect the new dose if appropriate.  Please advise.Despina Hidden Cassady3/30/20163:02 PM

## 2015-01-20 NOTE — Progress Notes (Signed)
   Subjective:    Patient ID: Sean Walter, male    DOB: December 20, 1970, 44 y.o.   MRN: [id number]  HPI    Review of Systems  Constitutional: Negative for fever.  Respiratory: Negative for cough and shortness of breath.   Musculoskeletal: Positive for arthralgias. Negative for gait problem.  Psychiatric/Behavioral: Negative for agitation.       Objective:   Physical Exam  Constitutional: He is oriented to person, place, and time. He appears well-developed and well-nourished. No distress.  Cardiovascular: Normal rate.   Pulmonary/Chest: Effort normal. No respiratory distress.  Musculoskeletal: He exhibits tenderness.  TTP over left shoulder but no crepitus with motion  Neurological: He is alert and oriented to person, place, and time.  Skin: Skin is warm and dry. He is not diaphoretic.  Psychiatric: He has a normal mood and affect.  Nursing note and vitals reviewed.         Assessment & Plan:

## 2015-01-20 NOTE — Addendum Note (Signed)
Addended by: Adele Barthel D on: 01/20/2015 03:36 PM   Modules accepted: Level of Service

## 2015-01-21 NOTE — Telephone Encounter (Signed)
He should be taking 20 mg twice daily

## 2015-01-22 ENCOUNTER — Other Ambulatory Visit: Payer: Self-pay | Admitting: Oncology

## 2015-01-25 NOTE — Telephone Encounter (Signed)
Jakafi rx written 10/22/14 was electronic sent to [facility] instead of [facility] ([employer]) who is assisting pt w/paying for this medication. Verbal order given to Jonni Sanger, pharmacist, at [facility] - qty#60 x 8 refills.

## 2015-01-25 NOTE — Telephone Encounter (Signed)
-----   Message from [name], MD sent at 01/22/2015  9:36 AM EDT ----- Please call patient:  He is supposed to be taking 20 mg twice daily of JAKOFI not 10 mg BID. Spleen is enlarging again.

## 2015-01-25 NOTE — Telephone Encounter (Signed)
Pt called and instructed to take 20 mg twice daily of Jakafi (which he repeated back) and informed spleen is enlarging again per Dr Beryle Beams .

## 2015-01-26 ENCOUNTER — Ambulatory Visit: Payer: BLUE CROSS/BLUE SHIELD | Attending: Family Medicine

## 2015-01-26 ENCOUNTER — Emergency Department (HOSPITAL_COMMUNITY)
Admission: EM | Admit: 2015-01-26 | Discharge: 2015-01-26 | Disposition: A | Discharge status: Home / Self Care | Payer: BLUE CROSS/BLUE SHIELD | Attending: Emergency Medicine | Admitting: Emergency Medicine

## 2015-01-26 ENCOUNTER — Encounter (HOSPITAL_COMMUNITY): Payer: Self-pay | Admitting: *Deleted

## 2015-01-26 DIAGNOSIS — Z720 Tobacco use: Secondary | ICD-10-CM | POA: Diagnosis not present

## 2015-01-26 DIAGNOSIS — Z79899 Other long term (current) drug therapy: Secondary | ICD-10-CM | POA: Insufficient documentation

## 2015-01-26 DIAGNOSIS — N492 Inflammatory disorders of scrotum: Secondary | ICD-10-CM | POA: Diagnosis not present

## 2015-01-26 DIAGNOSIS — J45909 Unspecified asthma, uncomplicated: Secondary | ICD-10-CM | POA: Insufficient documentation

## 2015-01-26 DIAGNOSIS — M25512 Pain in left shoulder: Secondary | ICD-10-CM | POA: Diagnosis not present

## 2015-01-26 DIAGNOSIS — M542 Cervicalgia: Secondary | ICD-10-CM

## 2015-01-26 DIAGNOSIS — Y9389 Activity, other specified: Secondary | ICD-10-CM | POA: Diagnosis not present

## 2015-01-26 DIAGNOSIS — Y9289 Other specified places as the place of occurrence of the external cause: Secondary | ICD-10-CM | POA: Insufficient documentation

## 2015-01-26 DIAGNOSIS — Y288XXA Contact with other sharp object, undetermined intent, initial encounter: Secondary | ICD-10-CM | POA: Insufficient documentation

## 2015-01-26 DIAGNOSIS — Y998 Other external cause status: Secondary | ICD-10-CM | POA: Insufficient documentation

## 2015-01-26 DIAGNOSIS — Z791 Long term (current) use of non-steroidal anti-inflammatories (NSAID): Secondary | ICD-10-CM | POA: Insufficient documentation

## 2015-01-26 DIAGNOSIS — Z862 Personal history of diseases of the blood and blood-forming organs and certain disorders involving the immune mechanism: Secondary | ICD-10-CM | POA: Insufficient documentation

## 2015-01-26 DIAGNOSIS — Z8619 Personal history of other infectious and parasitic diseases: Secondary | ICD-10-CM | POA: Diagnosis not present

## 2015-01-26 DIAGNOSIS — S30813A Abrasion of scrotum and testes, initial encounter: Secondary | ICD-10-CM | POA: Insufficient documentation

## 2015-01-26 DIAGNOSIS — S3994XA Unspecified injury of external genitals, initial encounter: Secondary | ICD-10-CM | POA: Diagnosis present

## 2015-01-26 MED ORDER — CEPHALEXIN 500 MG PO CAPS: 500.0000 mg | ORAL_CAPSULE | Freq: Four times a day (QID) | ORAL | Status: DC

## 2015-01-26 MED ORDER — LIDOCAINE-EPINEPHRINE (PF) 2 %-1:200000 IJ SOLN
20.0000 mL | Freq: Once | INTRAMUSCULAR | Status: DC
Start: 2015-01-26 — End: 2015-01-26

## 2015-01-26 MED ORDER — SULFAMETHOXAZOLE-TRIMETHOPRIM 800-160 MG PO TABS: 1.0000 | ORAL_TABLET | Freq: Two times a day (BID) | ORAL | Status: AC

## 2015-01-26 NOTE — Discharge Instructions (Signed)
Take both antibiotics to completion. It was suggested today that you have this abscess drained. Apply warm compresses. Follow-up with your primary care physician in 2 days for recheck, if you cannot follow-up with your primary care physician or did not have one, follow-up at the wellness clinic. Abscess An abscess is an infected area that contains a collection of pus and debris.It can occur in almost any part of the body. An abscess is also known as a furuncle or boil. CAUSES  An abscess occurs when tissue gets infected. This can occur from blockage of oil or sweat glands, infection of hair follicles, or a minor injury to the skin. As the body tries to fight the infection, pus collects in the area and creates pressure under the skin. This pressure causes pain. People with weakened immune systems have difficulty fighting infections and get certain abscesses more often.  SYMPTOMS Usually an abscess develops on the skin and becomes a painful mass that is red, warm, and tender. If the abscess forms under the skin, you may feel a moveable soft area under the skin. Some abscesses break open (rupture) on their own, but most will continue to get worse without care. The infection can spread deeper into the body and eventually into the bloodstream, causing you to feel ill.  DIAGNOSIS  Your caregiver will take your medical history and perform a physical exam. A sample of fluid may also be taken from the abscess to determine what is causing your infection. TREATMENT  Your caregiver may prescribe antibiotic medicines to fight the infection. However, taking antibiotics alone usually does not cure an abscess. Your caregiver may need to make a small cut (incision) in the abscess to drain the pus. In some cases, gauze is packed into the abscess to reduce pain and to continue draining the area. HOME CARE INSTRUCTIONS   Only take over-the-counter or prescription medicines for pain, discomfort, or fever as directed by  your caregiver.  If you were prescribed antibiotics, take them as directed. Finish them even if you start to feel better.  If gauze is used, follow your caregiver's directions for changing the gauze.  To avoid spreading the infection:  Keep your draining abscess covered with a bandage.  Wash your hands well.  Do not share personal care items, towels, or whirlpools with others.  Avoid skin contact with others.  Keep your skin and clothes clean around the abscess.  Keep all follow-up appointments as directed by your caregiver. SEEK MEDICAL CARE IF:   You have increased pain, swelling, redness, fluid drainage, or bleeding.  You have muscle aches, chills, or a general ill feeling.  You have a fever. MAKE SURE YOU:   Understand these instructions.  Will watch your condition.  Will get help right away if you are not doing well or get worse. Document Released: 07/19/2005 Document Revised: 04/09/2012 Document Reviewed: 12/22/2011 [facility] Patient Information 2015 [place], [place]. This information is not intended to replace advice given to you by your health care provider. Make sure you discuss any questions you have with your health care provider.

## 2015-01-26 NOTE — ED Notes (Signed)
Declined W/C at D/C and was escorted to lobby by RN. 

## 2015-01-26 NOTE — Therapy (Signed)
[place], [place], [address] Phone: [phone]   Fax:  [phone]  Physical Therapy Treatment  Patient Details  Name: Sean Walter MRN: [id number] Date of Birth: 1970/12/30 Referring Provider:  Dene Gentry, MD  Encounter Date: 01/26/2015      PT End of Session - 01/26/15 0835    Visit Number 5   Number of Visits 12   Date for PT Re-Evaluation 02/05/15   PT Start Time 0757   PT Stop Time 0835   PT Time Calculation (min) 38 min   Activity Tolerance Patient tolerated treatment well  very mild soreness after UBE but this did not get worse with sitting exercise   Behavior During Therapy [facility] for tasks assessed/performed      Past Medical History  Diagnosis Date  . Asthma, cold induced   . Herpes   . Asthma   . No pertinent past medical history     enlarged spleen  . Spleen enlargement     myofibrosis need bonemarrow transplant  . Myelofibrosis     followed by Dr. Sherryl Manges  . 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 Mandible fracture surgery    . Mandibular hardware removal  04/10/2012    Procedure: MANDIBULAR HARDWARE REMOVAL;  Surgeon: Jodi Marble, MD;  Location: [place];  Service: ENT;  Laterality: Right;    There were no vitals filed for this visit.  Visit Diagnosis:  Pain in neck  Pain in joint, shoulder region, left      Subjective Assessment - 01/26/15 0803    Subjective Now feeling Ok . LAst night neck was hurting .      Currently in Pain? No/denies   Multiple Pain Sites No            [facility] PT Assessment - 01/26/15 0817    AROM   Cervical - Right Side Bend 43   Cervical - Left Side Bend 45   Cervical - Right Rotation 75   Cervical - Left Rotation 75                   [facility] Adult PT Treatment/Exercise - 01/26/15  0811    Neck Exercises: Machines for Strengthening   [employer] for Upper Extremity Motion L2 and he was able to complete 5 min with mild soreness   Neck Exercises: Seated   Other Seated Exercise Back and head to wall  withpilow to head to help alignment  with arm lifts , added to HEP   Neck Exercises: Supine   Other Supine Exercise L2 stab exercises 12 reps good cervical postion                PT Education - 01/26/15 0813    Education provided Yes   Education Details Level 2 cervical stab exer, and sitting or standing with chin tucked and arm raies into flexion and into ER x 12   Person(s) Educated Patient   Methods Explanation;Verbal cues;Handout   Comprehension Returned demonstration          PT Short Term Goals - 01/26/15 0842    PT SHORT TERM GOAL #1   Title He will be indepndent with inital HEP   Status Achieved   PT SHORT TERM GOAL #2   Title He will report pain decreased 30% or more  with home activity  and assisting children   Status Achieved           PT Long Term Goals - 12/25/14 0917    PT LONG TERM GOAL #1   Title He will be independent with all HEP issued as of last visit   Time 6   Period Weeks   Status New   PT LONG TERM GOAL #2   Title He will report pain decreased 75% or more with work tasks    PT [place] #3   Title He will report able to feed child without pain   Time 6   Period Weeks   Status New               Plan - 01/26/15 8250    Clinical Impression Statement improved with tolerance to exercise and decreased pain . Progressing toward goals   PT Next Visit Plan Stab exercise progression with bands?,    Consulted and Agree with Plan of Care Patient        Problem List Patient Active Problem List   Diagnosis Date Noted  . Neck pain 12/14/2014  . Left shoulder pain 11/24/2014  . Folliculitis 53/97/6734  . Lower back pain 10/27/2014  . 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 HSV-1  infection 05/08/2012  . Primary myelofibrosis   . Spleen enlargement     Darrel Hoover PT 01/26/2015, 8:42 AM  [facility] [address] [place], [place], [address] Phone: [phone]   Fax:  [phone]

## 2015-01-26 NOTE — ED Notes (Signed)
Pt cut open the abscess on own. . The site continues to drain after Pt cut open the site . Abscess is located on Lt test.

## 2015-01-26 NOTE — ED Provider Notes (Signed)
CSN: [id number]     Arrival date & time 01/26/15  0844 History   First MD Initiated Contact with Patient 01/26/15 [phone]     Chief Complaint  Patient presents with  . Abscess     (Consider location/radiation/quality/duration/timing/severity/associated sxs/prior Treatment) HPI Comments: 44 year old male complaining of an abscess to his left scrotum times a week and a half. Reports he tried to cut the abscess on his own, and has been allowing it to drain. He has been placing tissue in the area and states when he places a tissue it is "sticky". The area continues to drain and is tender. Denies fevers. Denies penile pain, swelling or discharge, testicular pain. No new sexual partners. Reports he is married and is in a monogamous sexual relationship with his wife. Reports he had an abscess to this area in the past and was advised to not shave there anymore.  Patient is a 44 y.o. male presenting with abscess. The history is provided by the patient.  Abscess   Past Medical History  Diagnosis Date  . Asthma, cold induced   . Herpes   . Asthma   . No pertinent past medical history     enlarged spleen  . Spleen enlargement     myofibrosis need bonemarrow transplant  . Myelofibrosis     followed by Dr. Sherryl Manges  . 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 Mandible fracture surgery    . Mandibular hardware removal  04/10/2012    Procedure: MANDIBULAR HARDWARE REMOVAL;  Surgeon: Jodi Marble, MD;  Location: [place];  Service: ENT;  Laterality: Right;   History reviewed. No pertinent family history. History  Substance Use Topics  . Smoking status: Current Some Day Smoker -- 0.20 packs/day    Types: Cigarettes  . Smokeless tobacco: Not on file     Comment: 2 per day  . Alcohol Use: 0.0 oz/week    0 Standard drinks or equivalent per week     Comment: daily       Review of Systems  Skin: Positive for wound (abscess).  All other systems reviewed and are negative.     Allergies  Ibuprofen  Home Medications   Prior to Admission medications   Medication Sig Start Date End Date Taking? Authorizing Provider  acetaminophen (TYLENOL) 325 MG tablet Take 2 tablets (650 mg total) by mouth every 4 (four) hours as needed. 11/24/14 11/24/15  Riccardo Dubin, MD  albuterol (PROVENTIL HFA;VENTOLIN HFA) 108 (90 BASE) MCG/ACT inhaler Inhale 2 puffs into the lungs every 6 (six) hours as needed for wheezing or shortness of breath.     Historical Provider, MD  cephALEXin (KEFLEX) 500 MG capsule Take 1 capsule (500 mg total) by mouth 4 (four) times daily. 01/26/15   Carman Ching, PA-C  diclofenac sodium (VOLTAREN) 1 % GEL Apply 2 g topically 4 (four) times daily. 12/01/14   Riccardo Dubin, MD  HYDROcodone-acetaminophen (NORCO/VICODIN) 5-325 MG per tablet Take 1 tablet by mouth every 6 (six) hours as needed for moderate pain. 01/05/15   Rushil Sherrye Payor, MD  imiquimod [place]) 5 % cream  11/05/14   Historical Provider, MD  meloxicam (MOBIC) 15 MG tablet Take 1 tablet (15 mg total) by mouth daily. 12/11/14   Dene Gentry, MD  predniSONE (STERAPRED UNI-PAK) 10 MG tablet 6 tabs po day 1, 5 tabs po day 2, 4 tabs  po day 3, 3 tabs po day 4, 2 tabs po day 5, 1 tab po day 6 12/11/14   Dene Gentry, MD  ruxolitinib phosphate (JAKAFI) 20 MG tablet Take 1 tablet (20 mg total) by mouth 2 (two) times daily. 10/22/14   [name], MD  sulfamethoxazole-trimethoprim (BACTRIM DS,SEPTRA DS) 800-160 MG per tablet Take 1 tablet by mouth 2 (two) times daily. 01/26/15 02/02/15  Jomes Giraldo M Deja Pisarski, PA-C   BP 134/78 mmHg  Pulse 88  Temp(Src) 98.1 F (36.7 C) (Oral)  Resp 18  SpO2 100% Physical Exam  Constitutional: He is oriented to person, place, and time. He appears well-developed and well-nourished. No distress.  HENT:  Head: Normocephalic and atraumatic.  Eyes: Conjunctivae and EOM are  normal.  Neck: Normal range of motion. Neck supple.  Cardiovascular: Normal rate, regular rhythm and normal heart sounds.   Pulmonary/Chest: Effort normal and breath sounds normal.  Genitourinary:     Musculoskeletal: Normal range of motion. He exhibits no edema.  Neurological: He is alert and oriented to person, place, and time.  Skin: Skin is warm and dry.  Psychiatric: He has a normal mood and affect. His behavior is normal.  Nursing note and vitals reviewed.   ED Course  Procedures (including critical care time)     Labs Review Labs Reviewed - No data to display  Imaging Review No results found.   EKG Interpretation None      MDM   Final diagnoses:  Abrasion of scrotum, initial encounter   Nontoxic appearing, NAD. AF VSS. I discussed with patient that the appropriate treatment is drainage of the abscess, however he is refusing this being done I will start patient on antibiotics, and discussed importance of follow-up with PCP in 2 days for reevaluation. No cellulitis. Stable for discharge. Return precautions given. Patient states understanding of treatment care plan and is agreeable.  Discussed with attending Dr. Leonides Schanz who agrees with plan of care.    Carman Ching, PA-C 01/26/15 [place], DO 01/26/15 1010

## 2015-01-26 NOTE — Patient Instructions (Addendum)
Level 2 cervical stabilization exercises issued 12-15 reps 1x/day. Also sit or stand stab exercises with arm lifts into flexion and into ER

## 2015-01-28 ENCOUNTER — Ambulatory Visit: Payer: BLUE CROSS/BLUE SHIELD

## 2015-01-28 ENCOUNTER — Telehealth: Payer: Self-pay | Admitting: Internal Medicine

## 2015-01-28 DIAGNOSIS — M542 Cervicalgia: Secondary | ICD-10-CM

## 2015-01-28 DIAGNOSIS — M25512 Pain in left shoulder: Secondary | ICD-10-CM

## 2015-01-28 NOTE — Telephone Encounter (Signed)
Call to patient to confirm appointment for 02/01/15 at 8:45 lmtcb

## 2015-01-28 NOTE — Therapy (Signed)
[place], [place], [address] Phone: [phone]   Fax:  [phone]  Physical Therapy Treatment  Patient Details  Name: Sean Walter MRN: [id number] Date of Birth: 1970-12-29 Referring Provider:  Dene Gentry, MD  Encounter Date: 01/28/2015      PT End of Session - 01/28/15 0854    Visit Number 6   Number of Visits 12   Date for PT Re-Evaluation 02/05/15   PT Start Time 0805   PT Stop Time 0855   PT Time Calculation (min) 50 min   Activity Tolerance Patient tolerated treatment well   Behavior During Therapy [facility] for tasks assessed/performed      Past Medical History  Diagnosis Date  . Asthma, cold induced   . Herpes   . Asthma   . No pertinent past medical history     enlarged spleen  . Spleen enlargement     myofibrosis need bonemarrow transplant  . Myelofibrosis     followed by Dr. Sherryl Manges  . 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 Mandible fracture surgery    . Mandibular hardware removal  04/10/2012    Procedure: MANDIBULAR HARDWARE REMOVAL;  Surgeon: Jodi Marble, MD;  Location: [place];  Service: ENT;  Laterality: Right;    There were no vitals filed for this visit.  Visit Diagnosis:  Pain in neck - Plan: PT plan of care cert/re-cert  Pain in joint, shoulder region, left - Plan: PT plan of care cert/re-cert      Subjective Assessment - 01/28/15 0810    Subjective No pain to start today   Currently in Pain? No/denies   Multiple Pain Sites No                       [facility] Adult PT Treatment/Exercise - 01/28/15 0810    Neck Exercises: Standing   Wall Push Ups 20 reps   Lift / Chop --  12 reps RT and left lifts 10 pounds.    Other Standing Exercises head to wall with chin tuck and shoulder flexion x 15, abduct with EER  x15   Other Standing Exercises body blade lateral and up and down x 30 sec 2 sets    Neck Exercises: Supine   Shoulder ABduction Both;15 reps   Shoulder Abduction Limitations red band with chin tuck   Upper Extremity D1 Flexion;Extension;15 reps   Other Supine Exercise also ER x15 with chin tuck   Moist Heat Therapy   Number Minutes Moist Heat 12 Minutes   Moist Heat Location --  neck   Ultrasound   Ultrasound Location LT neck   Ultrasound Parameters 100% 1.5Wcm2, 1MHZ   Ultrasound Goals Pain                  PT Short Term Goals - 01/28/15 2355    PT SHORT TERM GOAL #1   Title He will be indepndent with inital HEP   Status Achieved   PT SHORT TERM GOAL #2   Title He will report pain decreased 30% or more  with home activity and assisting children   Status Achieved           PT Long Term Goals - 01/28/15 7322    PT LONG TERM GOAL #1   Title He will be independent with all HEP  issued as of last visit   Status On-going   PT LONG TERM GOAL #2   Title He will report pain decreased 75% or more with work tasks    Status On-going   PT LONG TERM GOAL #3   Title He will report able to feed child without pain   Status On-going               Plan - 01/28/15 0855    Clinical Impression Statement He is improved with activity tolerance and generally decreased pain. He did have some incr pain after MHP  may be related to  weight of pack. He was able to rotate neck without incr pain.    Rehab Potential Good   PT Frequency 2x / week   PT Duration 4 weeks   PT Next Visit Plan Cont stab exercises modalities/manual as needed   PT Home Exercise Plan Add band to HEP   Consulted and Agree with Plan of Care Patient        Problem List Patient Active Problem List   Diagnosis Date Noted  . Neck pain 12/14/2014  . Left shoulder pain 11/24/2014  . Folliculitis 72/53/6644  . Lower back pain 10/27/2014  . Unclassifiable myelodysplastic/myeloproliferative neoplasm  11/10/2012  . 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 Primary myelofibrosis   . Spleen enlargement     Darrel Hoover PT 01/28/2015, 9:01 AM  [facility] [address] [place], [place], [address] Phone: [phone]   Fax:  [phone]

## 2015-02-01 ENCOUNTER — Ambulatory Visit: Payer: BLUE CROSS/BLUE SHIELD | Admitting: Internal Medicine

## 2015-02-02 ENCOUNTER — Ambulatory Visit (INDEPENDENT_AMBULATORY_CARE_PROVIDER_SITE_OTHER): Payer: BLUE CROSS/BLUE SHIELD | Admitting: Oncology

## 2015-02-02 ENCOUNTER — Other Ambulatory Visit: Payer: BLUE CROSS/BLUE SHIELD

## 2015-02-02 ENCOUNTER — Encounter: Payer: Self-pay | Admitting: Oncology

## 2015-02-02 VITALS — BP 123/82 | HR 86 | Temp 98.5°F | Ht 72.0 in | Wt 224.2 lb

## 2015-02-02 DIAGNOSIS — D471 Chronic myeloproliferative disease: Secondary | ICD-10-CM

## 2015-02-02 DIAGNOSIS — R161 Splenomegaly, not elsewhere classified: Secondary | ICD-10-CM

## 2015-02-02 DIAGNOSIS — D47Z9 Other specified neoplasms of uncertain behavior of lymphoid, hematopoietic and related tissue: Secondary | ICD-10-CM

## 2015-02-02 DIAGNOSIS — D474 Osteomyelofibrosis: Secondary | ICD-10-CM | POA: Diagnosis not present

## 2015-02-02 DIAGNOSIS — C946 Myelodysplastic disease, not classified: Secondary | ICD-10-CM

## 2015-02-02 LAB — COMPREHENSIVE METABOLIC PANEL
ALBUMIN: 4.1 g/dL (ref 3.5–5.2)
ALT: 34 U/L (ref 0–53)
AST: 30 U/L (ref 0–37)
Alkaline Phosphatase: 64 U/L (ref 39–117)
Anion gap: 8 (ref 5–15)
BUN: 8 mg/dL (ref 6–23)
CALCIUM: 9.4 mg/dL (ref 8.4–10.5)
CO2: 24 mmol/L (ref 19–32)
Chloride: 104 mmol/L (ref 96–112)
Creatinine, Ser: 1.19 mg/dL (ref 0.50–1.35)
GFR calc Af Amer: 85 mL/min — ABNORMAL LOW (ref >90)
GFR, EST NON AFRICAN AMERICAN: 73 mL/min — AB (ref >90)
Glucose, Bld: 93 mg/dL (ref 70–99)
Potassium: 4.2 mmol/L (ref 3.5–5.1)
Sodium: 136 mmol/L (ref 135–145)
Total Bilirubin: 0.9 mg/dL (ref 0.3–1.2)
Total Protein: 7.1 g/dL (ref 6.0–8.3)

## 2015-02-02 LAB — CBC WITH DIFFERENTIAL/PLATELET
BASOS PCT: 1 % (ref 0–1)
Basophils Absolute: 0 10*3/uL (ref 0.0–0.1)
Eosinophils Absolute: 0 10*3/uL (ref 0.0–0.7)
Eosinophils Relative: 1 % (ref 0–5)
HCT: 43.9 % (ref 39.0–52.0)
HEMOGLOBIN: 14.2 g/dL (ref 13.0–17.0)
Lymphocytes Relative: 7 % — ABNORMAL LOW (ref 12–46)
Lymphs Abs: 0.6 10*3/uL — ABNORMAL LOW (ref 0.7–4.0)
MCH: 26.1 pg (ref 26.0–34.0)
MCHC: 32.3 g/dL (ref 30.0–36.0)
MCV: 80.6 fL (ref 78.0–100.0)
MONOS PCT: 3 % (ref 3–12)
Monocytes Absolute: 0.2 10*3/uL (ref 0.1–1.0)
NEUTROS ABS: 6.9 10*3/uL (ref 1.7–7.7)
Neutrophils Relative %: 89 % — ABNORMAL HIGH (ref 43–77)
PLATELETS: 244 10*3/uL (ref 150–400)
RBC: 5.45 MIL/uL (ref 4.22–5.81)
RDW: 16.5 % — ABNORMAL HIGH (ref 11.5–15.5)
WBC: 7.7 10*3/uL (ref 4.0–10.5)

## 2015-02-02 LAB — LACTATE DEHYDROGENASE: LDH: 433 U/L — ABNORMAL HIGH (ref 94–250)

## 2015-02-02 LAB — URIC ACID: Uric Acid, Serum: 7.6 mg/dL (ref 4.0–7.8)

## 2015-02-02 NOTE — Patient Instructions (Signed)
Continue JAKOFI 20 mg twice daily Continue monthly blood counts Call [facility] for a follow up appointment with transplant service Visit with Dr Darnell Level in 3 months Ultrasound of abdomen 1 week before visit

## 2015-02-02 NOTE — Progress Notes (Signed)
Patient ID: Sean Walter, male   DOB: 01-03-1971, 44 y.o.   MRN: [id number] Hematology and Oncology Follow Up Visit  Sean Walter [id number] 29-Jun-1971 44 y.o. 02/02/2015 7:29 PM   Principle Diagnosis: Encounter Diagnoses  Name Primary?  . Primary myelofibrosis Yes  . Spleen enlargement   . Unclassifiable myelodysplastic/myeloproliferative neoplasm    clinical summary: 43 year old man who was initially called myelofibrosis but who more likely has a non-BCR-ABL myeloproliferative disorder. He presented with leukocytosis and massive splenomegaly in January 2012. Bone marrow biopsy done in 11/16/10 was hypercellular with prominent proliferation of megakaryocytes many of which had abnormal morphology. Collagen and reticulin stains were focally positive. There were no excess blasts; in fact, on a 500 cell count differential, 0% blasts were recorded. Routine cytogenetics were normal but FISH probes were not done. Peripheral blood was negative for the presence of BCR-ABL transcripts. He is [name]. (03/07/13)  The patient was started on JAKOFI in February, 2012. Current dose is 20 mg twice daily. He has had doses as high as 25 mg twice daily.  I have  followed him since July of 2013. Blood counts initially stable and there was a significant decrease in his spleen size although he continued to complain of pain and requested Percocet on a regular basis.  He developed cumulative myelotoxicity requiring a dose reduction from 25 mg twice daily down to 20 mg twice daily in late August 2015. There was a discrepancy in my notes where I said I was decreasing him to 50 mg twice daily in August 2015 and then brought a prescription for 20 mg twice daily. He recently came to my attention that the patient was actually only taking 10 mg twice daily. It is no surprise that his blood counts completely recovered but also that his spleen started to enlarge again at time of most recent ultrasound  done on 01/08/2015 with spleen dimensions 17 x 6 x 17 cm and volume 1189 mL cubed. I wrote a  new prescription for 20 mg twice daily but he only started taking it 2 days ago on 01/31/2015.  Since December, 2014 he has developed multiple small warts primarily on the back of his neck but also on the front with some larger scattered lesions up to about a half a centimeter on his scalp. He shaves his head. He has seen a dermatologist. Some of the larger lesions were excised. Some of the smaller lesions were frozen. They keep coming back. He wanted to know if they could be from the [facility]. I looked this up for him. Nonmelanoma skin cancers have been seen and possibly associated with the drug. There is no mention of any viral reactivation or condylomata associated with this drug. He is now seeing a dermatologist who prescribed a topical antiviral lotion which is helping. ([place])   Interim History: He is had no interim infections. He did develop a musculoskeletal problem with his left shoulder after lifting and throwing a heavy box at work. He is currently receiving physical therapy which is helping. Cutaneous warty growths are regressing with topical antiviral treatment.  Medications:   Current outpatient prescriptions:  .  acetaminophen (TYLENOL) 325 MG tablet, Take 2 tablets (650 mg total) by mouth every 4 (four) hours as needed., Disp: 100 tablet, Rfl: 2 .  albuterol (PROVENTIL HFA;VENTOLIN HFA) 108 (90 BASE) MCG/ACT inhaler, Inhale 2 puffs into the lungs every 6 (six) hours as needed for wheezing or shortness of breath. , Disp: , Rfl:  .  cephALEXin (KEFLEX) 500 MG capsule, Take 1 capsule (500 mg total) by mouth 4 (four) times daily., Disp: 28 capsule, Rfl: 0 .  diclofenac sodium (VOLTAREN) 1 % GEL, Apply 2 g topically 4 (four) times daily., Disp: 100 g, Rfl: 0 .  HYDROcodone-acetaminophen (NORCO/VICODIN) 5-325 MG per tablet, Take 1 tablet by mouth every 6 (six) hours as needed for moderate pain.,  Disp: 60 tablet, Rfl: 0 .  imiquimod (ALDARA) 5 % cream, , Disp: , Rfl: 6 .  meloxicam (MOBIC) 15 MG tablet, Take 1 tablet (15 mg total) by mouth daily., Disp: 30 tablet, Rfl: 2 .  predniSONE (STERAPRED UNI-PAK) 10 MG tablet, 6 tabs po day 1, 5 tabs po day 2, 4 tabs po day 3, 3 tabs po day 4, 2 tabs po day 5, 1 tab po day 6, Disp: 21 tablet, Rfl: 0 .  ruxolitinib phosphate (JAKAFI) 20 MG tablet, Take 1 tablet (20 mg total) by mouth 2 (two) times daily., Disp: 60 tablet, Rfl: 11 .  sulfamethoxazole-trimethoprim (BACTRIM DS,SEPTRA DS) 800-160 MG per tablet, Take 1 tablet by mouth 2 (two) times daily., Disp: 14 tablet, Rfl: 0  Allergies:  Allergies  Allergen Reactions  . Ibuprofen Other (See Comments)    Indigestion    Review of Systems: See HPI Remaining ROS negative:   Physical Exam: Blood pressure 123/82, pulse 86, temperature 98.5 F (36.9 C), temperature source Oral, height 6' (1.829 m), weight 224 lb 3.2 oz (101.696 kg), SpO2 98 %. Wt Readings from Last 3 Encounters:  02/02/15 224 lb 3.2 oz (101.696 kg)  01/01/15 220 lb 14.4 oz (100.2 kg)  12/11/14 220 lb (99.791 kg)     General appearance: muscular, healthy appearing, AA man HENNT: Pharynx no erythema, exudate, mass, or ulcer. No thyromegaly or thyroid nodules Lymph nodes: No cervical, supraclavicular, or axillary lymphadenopathy Breasts:  Lungs: Clear to auscultation, resonant to percussion throughout Heart: Regular rhythm, no murmur, no gallop, no rub, no click, no edema Abdomen: Soft, nontender, normal bowel sounds, no mass, no organomegaly Extremities: No edema, no calf tenderness Musculoskeletal: no joint deformities GU:  Vascular: Carotid pulses 2+, no bruits, distal pulses: Dorsalis pedis 1+ symmetric Neurologic: Alert, oriented, PERRLA, optic discs sharp and vessels normal, no hemorrhage or exudate, cranial nerves grossly normal, motor strength 5 over 5, reflexes 1+ symmetric, upper body coordination normal, gait  normal, Skin: No rash or ecchymosis  Lab Results: CBC W/Diff    Component Value Date/Time   WBC 7.7 02/02/2015 0946   WBC 7.1 01/05/2014 1035   RBC 5.45 02/02/2015 0946   RBC 4.69 01/05/2014 1035   HGB 14.2 02/02/2015 0946   HGB 12.0* 01/05/2014 1035   HCT 43.9 02/02/2015 0946   HCT 37.1* 01/05/2014 1035   PLT 244 02/02/2015 0946   PLT 134* 01/05/2014 1035   MCV 80.6 02/02/2015 0946   MCV 79.1* 01/05/2014 1035   MCH 26.1 02/02/2015 0946   MCH 25.6* 01/05/2014 1035   MCHC 32.3 02/02/2015 0946   MCHC 32.3 01/05/2014 1035   RDW 16.5* 02/02/2015 0946   RDW 16.8* 01/05/2014 1035   LYMPHSABS 0.6* 02/02/2015 0946   LYMPHSABS 0.7* 01/05/2014 1035   MONOABS 0.2 02/02/2015 0946   MONOABS 0.3 01/05/2014 1035   EOSABS 0.0 02/02/2015 0946   EOSABS 0.1 01/05/2014 1035   BASOSABS 0.0 02/02/2015 0946   BASOSABS 0.1 01/05/2014 1035     Chemistry      Component Value Date/Time   NA 136 02/02/2015 0946   NA 141  01/05/2014 1035   K 4.2 02/02/2015 0946   K 3.9 01/05/2014 1035   CL 104 02/02/2015 0946   CL 106 03/07/2013 0826   CO2 24 02/02/2015 0946   CO2 24 01/05/2014 1035   BUN 8 02/02/2015 0946   BUN 11.3 01/05/2014 1035   CREATININE 1.19 02/02/2015 0946   CREATININE 1.22 12/29/2014 0903   CREATININE 1.8* 01/05/2014 1035      Component Value Date/Time   CALCIUM 9.4 02/02/2015 0946   CALCIUM 9.5 01/05/2014 1035   ALKPHOS 64 02/02/2015 0946   ALKPHOS 57 01/05/2014 1035   AST 30 02/02/2015 0946   AST 21 01/05/2014 1035   ALT 34 02/02/2015 0946   ALT 31 01/05/2014 1035   BILITOT 0.9 02/02/2015 0946   BILITOT 0.49 01/05/2014 1035       Radiological Studies: US Abdomen Limited  01/08/2015   CLINICAL DATA:  Patient with known myelofibrosis. Follow-up splenomegaly. History of HSV-1 infection.  EXAM: US ABDOMEN LIMITED  COMPARISON:  Ultrasound, 07/10/2014.  CT, 01/05/2014.  FINDINGS: Spleen:  Volume calculated at 1,189.2 mL. Spleen measures 16.8 cm x 6 cm x 17 cm. There is a  hyperechoic lesion along the inferior margin of the spleen measuring 16 mm x 13 mm x 16 mm. This is likely a hemangioma. It is stable. No other splenic lesions.  IMPRESSION: 1. Enlarged spleen. Volume measures greater than it did on the prior ultrasound, currently 1189 mL and previously 620 mL. 2. Stable small hyperechoic lesion along the inferior margin of the spleen measuring 16 mm likely a hemangioma. No other splenic lesions.   Electronically Signed   By: Lajean Manes M.D.   On: 01/08/2015 16:47    Impression:  #1.JAK-2 positive myeloproliferative disease with early marrow fibrosis and associated splenomegaly Initial response to [place] but regrowth of the spleen following dose reduction for myelotoxicity in August 2015. Due to a miscommunication as outlined above, he decreased the dose to 10 mg twice daily instead of 20 mg twice daily. 20 mg twice daily reinstituted on 01/31/2015. 2 early to assess effect.I will continue to monitor blood counts monthly. Reassess spleen size by ultrasound in 3 months.  He had an initial consultation at [facility] with respect to bone marrow transplantation which is still the only curative treatment available for this disorder. Once again I strongly reinforced that he needs to pursue this and I asked him to reschedule a follow-up appointment with the doctors at [facility].  #2. Transient elevation of creatinine up to 1.8 now back to his baseline of 1.2 and likely normal for his muscle mass.  #3. 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  #4. Molluscum contagiosum versus simple verrucae skin of neck and scalp ongoing follow-up with dermatology.   CC: Patient Care Team: Riccardo Dubin, MD as PCP - General (Internal Medicine)   [name], MD 4/12/20167:29 PM

## 2015-02-03 ENCOUNTER — Other Ambulatory Visit: Payer: Self-pay | Admitting: *Deleted

## 2015-02-04 MED ORDER — HYDROCODONE-ACETAMINOPHEN 5-325 MG PO TABS: 1.0000 | ORAL_TABLET | Freq: Four times a day (QID) | ORAL | Status: DC | PRN

## 2015-02-04 NOTE — Telephone Encounter (Signed)
As this patient was seen by Dr. Beryle Beams on 02/02/15 and deemed necessary for their treatment as documented in the EHR, I will refill this prescription for Norco 5/325 mg x 50 tablets in line with taper documented in the office visit.

## 2015-02-08 ENCOUNTER — Ambulatory Visit: Payer: BLUE CROSS/BLUE SHIELD

## 2015-02-08 DIAGNOSIS — M542 Cervicalgia: Secondary | ICD-10-CM | POA: Diagnosis not present

## 2015-02-08 NOTE — Therapy (Signed)
[place], [place], [address] Phone: [phone]   Fax:  [phone]  Physical Therapy Treatment  Patient Details  Name: NOVA EVETT MRN: [id number] Date of Birth: [birth year] Referring Provider:  Riccardo Dubin, MD  Encounter Date: 02/08/2015      PT End of Session - 02/08/15 0829    Visit Number 7   Number of Visits 12   Date for PT Re-Evaluation 02/19/15   PT Start Time 0800   PT Stop Time 0845   PT Time Calculation (min) 45 min   Activity Tolerance Patient tolerated treatment well   Behavior During Therapy [facility] for tasks assessed/performed      Past Medical History  Diagnosis Date  . Asthma, cold induced   . Herpes   . Asthma   . No pertinent past medical history     enlarged spleen  . Spleen enlargement     myofibrosis need bonemarrow transplant  . Myelofibrosis     followed by Dr. Sherryl Manges  . 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 Mandible fracture surgery    . Mandibular hardware removal  04/10/2012    Procedure: MANDIBULAR HARDWARE REMOVAL;  Surgeon: Jodi Marble, MD;  Location: [place];  Service: ENT;  Laterality: Right;    There were no vitals filed for this visit.  Visit Diagnosis:  Pain in neck      Subjective Assessment - 02/08/15 0800    Subjective Pain today 3/10.[name] Kitchen Pain return 2 days ago.  No specific changes in activity. Pain returned without incident. Have been doing HEP. MD appointment next month.   Currently in Pain? Yes   Pain Score 3    Multiple Pain Sites No                       [facility] Adult PT Treatment/Exercise - 02/08/15 0811    Neck Exercises: Machines for Strengthening   UBE (Upper Arm Bike) 90 RPM 5 min no change in pain   Neck Exercises: Theraband   Horizontal ABduction Limitations Green band 2x  10 reps   Other Theraband Exercises flexion x 10 RT and LT green band.    Moist Heat Therapy   Number Minutes Moist Heat 15 Minutes   Moist Heat Location --  neck   Ultrasound   Ultrasound Location LT neck   Ultrasound Parameters 100%, 1 MHZ, 1.6Wcm2   Ultrasound Goals Pain                PT Education - 02/08/15 0832    Education provided Yes   Education Details Band with exercises   Methods Explanation   Comprehension Verbalized understanding;Returned demonstration          PT Short Term Goals - 01/28/15 0858    PT SHORT TERM GOAL #1   Title He will be indepndent with inital HEP   Status Achieved   PT SHORT TERM GOAL #2   Title He will report pain decreased 30% or more  with home activity and assisting children   Status Achieved           PT Long Term Goals - 02/08/15 7253    PT LONG TERM GOAL #1   Title He will be independent with all HEP issued as of last visit   Status On-going   PT LONG  TERM GOAL #2   Title He will report pain decreased 75% or more with work tasks    Status On-going   PT [place] #3   Title He will report able to feed child without pain   Status Partially Met               Plan - 02/08/15 0830    Clinical Impression Statement Contuinued improved and though pain returned it is not limiting activity. He is not lifting  so we need to try this with him and see if this will incr pain.    PT Next Visit Plan Trial of lifting floor to waist and waist to head or over  head level.  Cont modalities   PT Home Exercise Plan Green band issued for HEP stab exercises   Consulted and Agree with Plan of Care Patient        Problem List Patient Active Problem List   Diagnosis Date Noted  . Neck pain 12/14/2014  . Left shoulder pain 11/24/2014  . Folliculitis 74/60/0298  . Lower back pain 10/27/2014  . 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 Primary myelofibrosis    . Spleen enlargement     Darrel Hoover PT 02/08/2015, 8:33 AM  [facility] [address] [place], [place], [address] Phone: [phone]   Fax:  [phone]

## 2015-02-10 ENCOUNTER — Ambulatory Visit: Payer: BLUE CROSS/BLUE SHIELD

## 2015-02-10 DIAGNOSIS — M25512 Pain in left shoulder: Secondary | ICD-10-CM

## 2015-02-10 DIAGNOSIS — M542 Cervicalgia: Secondary | ICD-10-CM

## 2015-02-10 NOTE — Therapy (Signed)
[place], [place], [address] Phone: [phone]   Fax:  [phone]  Physical Therapy Treatment  Patient Details  Name: Sean Walter MRN: [id number] Date of Birth: 12-Aug-1971 Referring Provider:  Riccardo Dubin, MD  Encounter Date: 02/10/2015      PT End of Session - 02/10/15 0846    Visit Number 8   Number of Visits 12   Date for PT Re-Evaluation 02/19/15   PT Start Time 0800   PT Stop Time 0840   PT Time Calculation (min) 40 min   Activity Tolerance Patient tolerated treatment well   Behavior During Therapy [facility] for tasks assessed/performed      Past Medical History  Diagnosis Date  . Asthma, cold induced   . Herpes   . Asthma   . No pertinent past medical history     enlarged spleen  . Spleen enlargement     myofibrosis need bonemarrow transplant  . Myelofibrosis     followed by Dr. Sherryl Manges  . 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 Mandible fracture surgery    . Mandibular hardware removal  04/10/2012    Procedure: MANDIBULAR HARDWARE REMOVAL;  Surgeon: Jodi Marble, MD;  Location: [place];  Service: ENT;  Laterality: Right;    There were no vitals filed for this visit.  Visit Diagnosis:  Pain in neck  Pain in joint, shoulder region, left      Subjective Assessment - 02/10/15 0806    Subjective Pain earlier with lying dwn . No pain now                         [facility] Adult PT Treatment/Exercise - 02/10/15 0808    Neck Exercises: Standing   Other Standing Exercises Worked with pt on lifting mechanics using squat, hip hinge/deadlift tech and going to one knee. His maz liffting wihtout pain was 25 pounds and he wa able to do this 15 x to overhead level.  He had a twinge of pain on waling out but declinded heat.   Moist Heat  Therapy   Number Minutes Moist Heat 15 Minutes   Moist Heat Location --  neck   Ultrasound   Ultrasound Location LT neck    Ultrasound Parameters 100% , ! MHz , 1.6 Wcm2   Ultrasound Goals Pain                PT Education - 02/10/15 0845    Education provided Yes   Education Details lifting techniques   Person(s) Educated Patient   Methods Explanation;Demonstration;Tactile cues;Verbal cues   Comprehension Verbalized understanding;Returned demonstration          PT Short Term Goals - 01/28/15 3291    PT SHORT TERM GOAL #1   Title He will be indepndent with inital HEP   Status Achieved   PT SHORT TERM GOAL #2   Title He will report pain decreased 30% or more  with home activity and assisting children   Status Achieved           PT Long Term Goals - 02/08/15 9166    PT LONG TERM GOAL #1   Title He will be independent with all HEP issued as of last visit   Status On-going   PT LONG TERM GOAL #2   Title He  will report pain decreased 75% or more with work tasks    Status On-going   PT [place] #3   Title He will report able to feed child without pain   Status Partially Met               Plan - 02/10/15 0846    Clinical Impression Statement He is limited with weeight he is able to lift without pain. This does not match level needed to RTW. Will push within pain tolerance and reenforce good mechanics.    Rehab Potential Good   PT Frequency 2x / week   PT Duration 2 weeks   PT Next Visit Plan Progress lifting as able. Stability and modalities as needed   Consulted and Agree with Plan of Care Patient        Problem List Patient Active Problem List   Diagnosis Date Noted  . Neck pain 12/14/2014  . Left shoulder pain 11/24/2014  . Folliculitis 12/03/1733  . Lower back pain 10/27/2014  . 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 Primary myelofibrosis   . Spleen enlargement      Darrel Hoover PT 02/10/2015, 8:49 AM  [facility] [address] [place], [place], [address] Phone: [phone]   Fax:  [phone]

## 2015-02-10 NOTE — Patient Instructions (Signed)
Liftiing mechanics from floor. Mostly with hip hinge  With cues to keep chest up and buttock down with pin upper arms to body.

## 2015-02-15 ENCOUNTER — Ambulatory Visit (HOSPITAL_COMMUNITY): Payer: BLUE CROSS/BLUE SHIELD

## 2015-02-15 ENCOUNTER — Other Ambulatory Visit: Payer: BC Managed Care – PPO

## 2015-02-15 ENCOUNTER — Ambulatory Visit: Payer: BLUE CROSS/BLUE SHIELD

## 2015-02-15 DIAGNOSIS — M542 Cervicalgia: Secondary | ICD-10-CM

## 2015-02-15 DIAGNOSIS — M25512 Pain in left shoulder: Secondary | ICD-10-CM

## 2015-02-15 NOTE — Therapy (Signed)
[facility] [address] [place], [place], [address] Phone: [phone]   Fax:  [phone]  Physical Therapy Treatment  Patient Details  Name: Sean Walter MRN: [id number] Date of Birth: 07/30/1971 Referring Provider:  Patel, Rushil V, MD  Encounter Date: 02/15/2015      PT End of Session - 02/15/15 0841    Visit Number 9   Number of Visits 12   Date for PT Re-Evaluation 02/19/15   PT Start Time 0800   PT Stop Time 0900   PT Time Calculation (min) 60 min   Activity Tolerance Patient tolerated treatment well   Behavior During Therapy W[facility] for tasks assessed/performed      Past Medical History  Diagnosis Date  . Asthma, cold induced   . Herpes   . Asthma   . No pertinent past medical history     enlarged spleen  . Spleen enlargement     myofibrosis need bonemarrow transplant  . Myelofibrosis     followed by Dr. Huan Ha  . 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 Mandible fracture surgery    . Mandibular hardware removal  04/10/2012    Procedure: MANDIBULAR HARDWARE REMOVAL;  Surgeon: Karol Wolicki, MD;  Location: [facility];  Service: ENT;  Laterality: Right;    There were no vitals filed for this visit.  Visit Diagnosis:  Pain in neck  Pain in joint, shoulder region, left      Subjective Assessment - 02/15/15 0804    Subjective Pain 4-5/10. LAst painfree day 2-3 days ago   Currently in Pain? Yes   Pain Score 5    Pain Location Neck   Pain Orientation Left   Pain Type Chronic pain   Pain Onset More than a month ago   Pain Frequency Intermittent   Aggravating Factors  Pain comes out of blue sometimes.    Pain Relieving Factors Nothing in particular   Multiple Pain Sites No                         [facility] Adult PT Treatment/Exercise - 02/15/15 0807    Neck Exercises: Machines for Strengthening   UBE (Upper Arm Bike) 90 RPM 5 min no change in pain   Neck Exercises: Standing   Other Standing Exercises Worked with pt on lifting mechanics using squat, hip hinge/deadlift tech and going to one knee. His max liffting without increased pain from 4/10 pain was 35 pounds and he wa able to do this 10x to overhead level. He had 7/10 pain at 6 reps that decr to 4 and he finished 10 reps. He had a twinge of pain on lifting 40 pounds that did not increase overall pain so pulled back to 35 pounds for lifts/. PAin at end was 5/10   Moist Heat Therapy   Number Minutes Moist Heat 15 Minutes   Moist Heat Location --  neck   Ultrasound   Ultrasound Location Lt neck   Ultrasound Parameters 100% ,1Mhz , 1.7Wcm2   Ultrasound Goals Pain                  PT Short Term Goals - 01/28/15 0858    PT SHORT TERM GOAL #1   Title He will be indepndent with inital HEP   Status Achieved   PT SHORT TERM GOAL #2     Title He will report pain decreased 30% or more  with home activity and assisting children   Status Achieved           PT Long Term Goals - 02/08/15 0832    PT LONG TERM GOAL #1   Title He will be independent with all HEP issued as of last visit   Status On-going   PT LONG TERM GOAL #2   Title He will report pain decreased 75% or more with work tasks    Status On-going   PT LONG TERM GOAL #3   Title He will report able to feed child without pain   Status Partially Met               Plan - 02/15/15 0842    Clinical Impression Statement He was able to lift 35 pounds without sustained increased pain 10 reps but he must lift at least 60-70 pounds at work  with 100 's of reps in 4-5 hour shift. It does not appear he will be ale to tolerate that much work but it is hard to say. Will push as painlevels permit.    PT Next Visit Plan Progress lifting as able. Stability and modalities as needed   Consulted and Agree with Plan of Care  Patient        Problem List Patient Active Problem List   Diagnosis Date Noted  . Neck pain 12/14/2014  . Left shoulder pain 11/24/2014  . Folliculitis 11/04/2014  . Lower back pain 10/27/2014  . 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 Primary myelofibrosis   . Spleen enlargement     Chasse, Stephen M PT 02/15/2015, 8:47 AM  [facility] [address] [place], [place], [address] Phone: [phone]   Fax:  [phone]      

## 2015-02-17 ENCOUNTER — Ambulatory Visit: Payer: BLUE CROSS/BLUE SHIELD

## 2015-02-22 ENCOUNTER — Encounter: Payer: Self-pay | Admitting: Internal Medicine

## 2015-02-22 ENCOUNTER — Ambulatory Visit: Payer: BLUE CROSS/BLUE SHIELD | Attending: Family Medicine

## 2015-02-22 DIAGNOSIS — M542 Cervicalgia: Secondary | ICD-10-CM | POA: Diagnosis not present

## 2015-02-22 DIAGNOSIS — M25512 Pain in left shoulder: Secondary | ICD-10-CM | POA: Diagnosis not present

## 2015-02-22 NOTE — Therapy (Signed)
[place], [place], [address] Phone: [phone]   Fax:  [phone]  Physical Therapy Treatment  Patient Details  Name: Sean Walter MRN: [id number] Date of Birth: 12/01/1970 Referring Provider:  Riccardo Dubin, MD  Encounter Date: 02/22/2015      PT End of Session - 02/22/15 0911    Visit Number 10   Number of Visits 12   Date for PT Re-Evaluation 03/05/15   PT Start Time 0800   PT Stop Time 0900   PT Time Calculation (min) 60 min   Activity Tolerance Patient tolerated treatment well   Behavior During Therapy [facility] for tasks assessed/performed      Past Medical History  Diagnosis Date  . Asthma, cold induced   . Herpes   . Asthma   . No pertinent past medical history     enlarged spleen  . Spleen enlargement     myofibrosis need bonemarrow transplant  . Myelofibrosis     followed by Dr. Sherryl Manges  . 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 Mandible fracture surgery    . Mandibular hardware removal  04/10/2012    Procedure: MANDIBULAR HARDWARE REMOVAL;  Surgeon: Jodi Marble, MD;  Location: [facility];  Service: ENT;  Laterality: Right;    There were no vitals filed for this visit.  Visit Diagnosis:  Pain in neck      Subjective Assessment - 02/22/15 0805    Subjective No pain today   Currently in Pain? No/denies   Multiple Pain Sites No                         [facility] Adult PT Treatment/Exercise - 02/22/15 0806    Neck Exercises: Machines for Strengthening   UBE (Upper Arm Bike) Sci fit L2 6  min 1/2 forward and 1/2 back   Neck Exercises: Standing   Other Standing Exercises Worked with pt on lifting mechanics using squat, hip hinge/deadlift tech and going to one knee. His max liffting without increased pain from 4/10 pain was 35  pounds and he was able to do this 5x to overhead level. He had 3/10 pain . He then lifted  45 pounds that did not increase pain . Then lifted 55 pounds with pain level 0/10.  He then did 20 pounds lift and turn to waist level  25 reps with starting pain 2/10  and end pain 2/10. HR  85 and BP 145/90 at end. Pain level on leaving 4/10 but declined modalities.    Other Standing Exercises Push and pull sled with 4 10 pound discs on sled. 50 feet then with 2 plates 5/25 feet forward and back. with pain 2/10 post.                 PT Education - 02/22/15 0910    Education provided Yes   Education Details PAin variation after activitya nd after no activity and pain can fluctuate. Discussed inflamation and pain modulation of nervous system.    Person(s) Educated Patient   Methods Explanation   Comprehension Verbalized understanding          PT Short Term Goals - 01/28/15 0858    PT SHORT TERM GOAL #1   Title He will be indepndent with inital HEP   Status Achieved   PT SHORT  TERM GOAL #2   Title He will report pain decreased 30% or more  with home activity and assisting children   Status Achieved           PT Long Term Goals - 02/22/15 0915    PT LONG TERM GOAL #1   Title He will be independent with all HEP issued as of last visit   Status On-going   PT LONG TERM GOAL #2   Title He will report pain decreased 75% or more with work tasks    Status On-going   PT [place] #3   Title He will report able to feed child without pain   PT LONG TERM GOAL #4   Status Partially Met               Plan - 02/22/15 0912    Clinical Impression Statement He is tolerating increased weight with miinimal pain changes but these are not predicatble as the activity most times eases pain with increased pain later. He appears to have lifted weight to return to regualr position at work  but cannot be [employer] how repitition will affect him. He is able to push and pull at level to return to  worlk.   PT Next Visit Plan continue to progress lifting toward 100 pounds to lift in max of job description. Monitor BP and HR  FOTO before next visit.    Consulted and Agree with Plan of Care Patient        Problem List Patient Active Problem List   Diagnosis Date Noted  . Neck pain 12/14/2014  . Left shoulder pain 11/24/2014  . Folliculitis 01/01/8117  . Lower back pain 10/27/2014  . 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 Primary myelofibrosis   . Spleen enlargement     Darrel Hoover PT 02/22/2015, 9:16 AM  [facility] [address] [place], [place], [address] Phone: [phone]   Fax:  [phone]

## 2015-02-22 NOTE — Progress Notes (Unsigned)
Patient ID: Sean Walter, male   DOB: 10-28-1970, 44 y.o.   MRN: [id number]  I am completing paperwork today from [employer] that requires my signature for short-term disability covering the dates during which he has received PT. Per his most recent note, he is to be reassessed on 03/05/15, so I will put his estimated return to work date as 03/06/15.

## 2015-02-24 ENCOUNTER — Ambulatory Visit: Payer: BLUE CROSS/BLUE SHIELD

## 2015-02-24 VITALS — BP 165/100 | HR 88

## 2015-02-24 DIAGNOSIS — M542 Cervicalgia: Secondary | ICD-10-CM

## 2015-02-24 NOTE — Patient Instructions (Signed)
Discussed implications of high BP with work and possible need for medicaiton to ease BP with work tasks

## 2015-02-24 NOTE — Therapy (Signed)
[place], [place], [address] Phone: [phone]   Fax:  [phone]  Physical Therapy Treatment  Patient Details  Name: Sean Walter MRN: [id number] Date of Birth: [birth year] Referring Provider:  Riccardo Dubin, MD  Encounter Date: 02/24/2015      PT End of Session - 02/24/15 0900    Visit Number 11   Number of Visits 12   Date for PT Re-Evaluation 03/05/15   PT Start Time 0805   PT Stop Time 0850   PT Time Calculation (min) 45 min   Activity Tolerance Patient tolerated treatment well   Behavior During Therapy [facility] for tasks assessed/performed      Past Medical History  Diagnosis Date  . Asthma, cold induced   . Herpes   . Asthma   . No pertinent past medical history     enlarged spleen  . Spleen enlargement     myofibrosis need bonemarrow transplant  . Myelofibrosis     followed by Dr. Sherryl Manges  . 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 Mandible fracture surgery    . Mandibular hardware removal  04/10/2012    Procedure: MANDIBULAR HARDWARE REMOVAL;  Surgeon: Jodi Marble, MD;  Location: [facility];  Service: ENT;  Laterality: Right;    Filed Vitals:   02/24/15 0813  BP: 165/100  Pulse: 88    Visit Diagnosis:  Pain in neck      Subjective Assessment - 02/24/15 0813    Subjective No pain to start   Currently in Pain? No/denies   Multiple Pain Sites No            [facility] PT Assessment - 02/24/15 0817    Observation/Other Assessments   Focus on Therapeutic Outcomes (FOTO)  20%                     [facility] Adult PT Treatment/Exercise - 02/24/15 0814    Neck Exercises: Machines for Strengthening   UBE (Upper Arm Bike) Sci fit L2 5 min    Neck Exercises: Standing   Other Standing Exercises . His max liffting without increased pain  from 4/10 after UBE was 100 pounds and he was able to do this 5x to head  level. He had no pain .His blood pressure was 200/100. After rest BP was 170 /90. Then lifted  50 pounds to shoulder level x 20 reps at own pace.  BP post was 208/108 and HR 147. Post 3 min rest BP 165/90. His pain  did not increase .                 PT Education - 02/24/15 0900    Education provided Yes   Education Details BP implications   Person(s) Educated Patient   Methods Explanation   Comprehension Verbalized understanding          PT Short Term Goals - 01/28/15 0858    PT SHORT TERM GOAL #1   Title He will be indepndent with inital HEP   Status Achieved   PT SHORT TERM GOAL #2   Title He will report pain decreased 30% or more  with home activity and assisting children   Status Achieved           PT Long Term Goals - 02/22/15 0915    PT LONG TERM GOAL #1  Title He will be independent with all HEP issued as of last visit   Status On-going   PT LONG TERM GOAL #2   Title He will report pain decreased 75% or more with work tasks    Status On-going   PT [place] #3   Title He will report able to feed child without pain   PT LONG TERM GOAL #4   Status Partially Met               Plan - 02/24/15 0900    Clinical Impression Statement Lifting max of job descriiption at 100 pounds with good tech nique with some decreased box control. Blood pressure is very high with lifting and this may be concern for RTW. Will monittor and work on lifting again next week.    PT Frequency 2x / week   PT Next Visit Plan Continue for lifting 20-50 pounds at diffeerent levels        Problem List Patient Active Problem List   Diagnosis Date Noted  . Neck pain 12/14/2014  . Left shoulder pain 11/24/2014  . Folliculitis 24/06/7352  . Lower back pain 10/27/2014  . 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 Primary myelofibrosis    . Spleen enlargement     Darrel Hoover PT 02/24/2015, 9:07 AM  [facility] [address] [place], [place], [address] Phone: [phone]   Fax:  [phone]

## 2015-02-26 ENCOUNTER — Ambulatory Visit (HOSPITAL_COMMUNITY): Payer: BLUE CROSS/BLUE SHIELD

## 2015-03-01 ENCOUNTER — Telehealth: Payer: Self-pay | Admitting: Oncology

## 2015-03-01 ENCOUNTER — Ambulatory Visit: Payer: BLUE CROSS/BLUE SHIELD

## 2015-03-01 VITALS — BP 155/90 | HR 103

## 2015-03-01 DIAGNOSIS — M542 Cervicalgia: Secondary | ICD-10-CM | POA: Diagnosis not present

## 2015-03-01 NOTE — Telephone Encounter (Signed)
Call to patient to confirm appointment for 03/02/15 at 9:30 lmtcb

## 2015-03-01 NOTE — Therapy (Signed)
[place], [place], [address] Phone: [phone]   Fax:  [phone]  Physical Therapy Treatment  Patient Details  Name: Sean Walter MRN: [id number] Date of Birth: [birth year] Referring Provider:  Riccardo Dubin, MD  Encounter Date: 03/01/2015      PT End of Session - 03/01/15 0810    Visit Number 12   Number of Visits 13   Date for PT Re-Evaluation 03/05/15   PT Start Time 0800   PT Stop Time 0845   PT Time Calculation (min) 45 min   Activity Tolerance Patient tolerated treatment well   Behavior During Therapy [facility] for tasks assessed/performed      Past Medical History  Diagnosis Date  . Asthma, cold induced   . Herpes   . Asthma   . No pertinent past medical history     enlarged spleen  . Spleen enlargement     myofibrosis need bonemarrow transplant  . Myelofibrosis     followed by Dr. Sherryl Manges  . 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 Mandible fracture surgery    . Mandibular hardware removal  04/10/2012    Procedure: MANDIBULAR HARDWARE REMOVAL;  Surgeon: Jodi Marble, MD;  Location: [facility];  Service: ENT;  Laterality: Right;    Filed Vitals:   03/01/15 0808  BP: 155/90  Pulse: 103    Visit Diagnosis:  Pain in neck      Subjective Assessment - 03/01/15 0803    Subjective No pain . Doing ok.    Currently in Pain? No/denies   Multiple Pain Sites No                         [facility] Adult PT Treatment/Exercise - 03/01/15 0805    Neck Exercises: Machines for Strengthening   UBE (Upper Arm Bike) Sci fit L2 1/2 forward , 1/2 back   Neck Exercises: Standing   Other Standing Exercises Lifting 30 pounds to start x 5 reps to head level, waist level, ankle level and floor , HR 131    BP  1900/92. After 3 min rest HR 97, BP 165/95.    Then same circuit x5 with 30 pounds , HR  137 BP 170/95.  Then  50 pounds x5 reps  HR 135  BP 165/90, then 20 pounds x 5  HR 125  BP 155/90, then 50 pounds x 5  HR 145  BP  200/92.  After 3 min rest  HR  98  , BP 158/98.                     PT Short Term Goals - 01/28/15 6269    PT SHORT TERM GOAL #1   Title He will be indepndent with inital HEP   Status Achieved   PT SHORT TERM GOAL #2   Title He will report pain decreased 30% or more  with home activity and assisting children   Status Achieved           PT Long Term Goals - 02/22/15 0915    PT LONG TERM GOAL #1   Title He will be independent with all HEP issued as of last visit   Status On-going   PT LONG TERM GOAL #2   Title He will report pain decreased 75% or more  with work tasks    Status On-going   PT [place] #3   Title He will report able to feed child without pain   PT LONG TERM GOAL #4   Status Partially Met               Plan - 03/01/15 0840    Clinical Impression Statement His pain was 0/10 for session but HR andBP were elevated and i asked pt to follow up with MD for assessment.  He appears at risk with heavy lifting for elevated HR and BP. He did 100 lifts in 40 min from 20-50 pounds.      I discussed need to follow up with MD about RTW with elevated BP   Problem List Patient Active Problem List   Diagnosis Date Noted  . Neck pain 12/14/2014  . Left shoulder pain 11/24/2014  . Folliculitis 26/41/5830  . Lower back pain 10/27/2014  . 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 Primary myelofibrosis   . Spleen enlargement     Darrel Hoover PT 03/01/2015, 8:48 AM  [facility] [address] [place], [place], [address] Phone: [phone]   Fax:  [phone]

## 2015-03-02 ENCOUNTER — Other Ambulatory Visit: Payer: BLUE CROSS/BLUE SHIELD

## 2015-03-03 ENCOUNTER — Ambulatory Visit: Payer: BLUE CROSS/BLUE SHIELD

## 2015-03-04 ENCOUNTER — Other Ambulatory Visit: Payer: Self-pay | Admitting: *Deleted

## 2015-03-04 ENCOUNTER — Ambulatory Visit: Payer: BLUE CROSS/BLUE SHIELD | Admitting: Internal Medicine

## 2015-03-04 MED ORDER — HYDROCODONE-ACETAMINOPHEN 5-325 MG PO TABS: 1.0000 | ORAL_TABLET | Freq: Four times a day (QID) | ORAL | Status: DC | PRN

## 2015-03-04 NOTE — Telephone Encounter (Signed)
Last refill 02/05/15. Has an appt w/Dr Eula Fried on 5/13.

## 2015-03-04 NOTE — Telephone Encounter (Signed)
As this patient was seen by Dr. Beryle Beams on 02/02/15 and deemed necessary for their treatment as documented in the EHR, I will refill this prescription for Norco 5/325 x 40 tablets as we are currently tapering his medication. He can pick up his prescription tomorrow at the time of his follow-up appointment.  As the printer malfunctioned, I printed it three times. The first two were invalidated and discarded in a shredder.

## 2015-03-05 ENCOUNTER — Encounter: Payer: Self-pay | Admitting: Internal Medicine

## 2015-03-05 ENCOUNTER — Ambulatory Visit (INDEPENDENT_AMBULATORY_CARE_PROVIDER_SITE_OTHER): Payer: BLUE CROSS/BLUE SHIELD | Admitting: Internal Medicine

## 2015-03-05 ENCOUNTER — Other Ambulatory Visit: Payer: BLUE CROSS/BLUE SHIELD

## 2015-03-05 VITALS — BP 141/79 | HR 75 | Temp 98.5°F | Wt 223.0 lb

## 2015-03-05 DIAGNOSIS — D474 Osteomyelofibrosis: Secondary | ICD-10-CM | POA: Diagnosis not present

## 2015-03-05 DIAGNOSIS — R030 Elevated blood-pressure reading, without diagnosis of hypertension: Secondary | ICD-10-CM

## 2015-03-05 DIAGNOSIS — R161 Splenomegaly, not elsewhere classified: Secondary | ICD-10-CM | POA: Diagnosis not present

## 2015-03-05 DIAGNOSIS — IMO0001 Reserved for inherently not codable concepts without codable children: Secondary | ICD-10-CM

## 2015-03-05 DIAGNOSIS — C946 Myelodysplastic disease, not classified: Secondary | ICD-10-CM

## 2015-03-05 DIAGNOSIS — M25512 Pain in left shoulder: Secondary | ICD-10-CM | POA: Diagnosis not present

## 2015-03-05 DIAGNOSIS — D47Z9 Other specified neoplasms of uncertain behavior of lymphoid, hematopoietic and related tissue: Secondary | ICD-10-CM

## 2015-03-05 DIAGNOSIS — D471 Chronic myeloproliferative disease: Secondary | ICD-10-CM | POA: Diagnosis not present

## 2015-03-05 LAB — COMPREHENSIVE METABOLIC PANEL
ALT: 32 U/L (ref 0–53)
AST: 19 U/L (ref 0–37)
Albumin: 4.3 g/dL (ref 3.5–5.2)
Alkaline Phosphatase: 59 U/L (ref 39–117)
BUN: 9 mg/dL (ref 6–23)
CO2: 29 meq/L (ref 19–32)
Calcium: 9.1 mg/dL (ref 8.4–10.5)
Chloride: 100 mEq/L (ref 96–112)
Creat: 1.34 mg/dL (ref 0.50–1.35)
Glucose, Bld: 94 mg/dL (ref 70–99)
Potassium: 3.9 mEq/L (ref 3.5–5.3)
SODIUM: 139 meq/L (ref 135–145)
TOTAL PROTEIN: 7.6 g/dL (ref 6.0–8.3)
Total Bilirubin: 0.6 mg/dL (ref 0.2–1.2)

## 2015-03-05 LAB — CBC WITH DIFFERENTIAL/PLATELET
Basophils Absolute: 0.1 10*3/uL (ref 0.0–0.1)
Basophils Relative: 1 % (ref 0–1)
Eosinophils Absolute: 0.1 10*3/uL (ref 0.0–0.7)
Eosinophils Relative: 1 % (ref 0–5)
HEMATOCRIT: 38.3 % — AB (ref 39.0–52.0)
HEMOGLOBIN: 12.8 g/dL — AB (ref 13.0–17.0)
LYMPHS PCT: 11 % — AB (ref 12–46)
Lymphs Abs: 0.7 10*3/uL (ref 0.7–4.0)
MCH: 26.1 pg (ref 26.0–34.0)
MCHC: 33.4 g/dL (ref 30.0–36.0)
MCV: 78 fL (ref 78.0–100.0)
MONO ABS: 0.2 10*3/uL (ref 0.1–1.0)
Monocytes Relative: 3 % (ref 3–12)
NEUTROS ABS: 5.2 10*3/uL (ref 1.7–7.7)
NEUTROS PCT: 84 % — AB (ref 43–77)
Platelets: 215 10*3/uL (ref 150–400)
RBC: 4.91 MIL/uL (ref 4.22–5.81)
RDW: 17.6 % — ABNORMAL HIGH (ref 11.5–15.5)
WBC: 6.2 10*3/uL (ref 4.0–10.5)

## 2015-03-05 LAB — URIC ACID: Uric Acid, Serum: 6.4 mg/dL (ref 4.0–7.8)

## 2015-03-05 LAB — LACTATE DEHYDROGENASE: LDH: 282 U/L — ABNORMAL HIGH (ref 94–250)

## 2015-03-05 NOTE — Patient Instructions (Signed)
Dear Sean Walter   Please follow up with Dr. Posey Pronto next week and we will try to touch base with PT in regards to your progress.   Continue to work with PT in the meantime

## 2015-03-05 NOTE — Progress Notes (Signed)
Subjective:   Patient ID: Sean Walter male   DOB: 11/10/1970 44 y.o.   MRN: [id number]  HPI: [name] is a 44 y.o. male with PMH as listed below presenting to clinic for follow up visit in regards to his neck pain.   L shoulder and neck pain--s/p work related injury February 2016 that is improving with PT. He is currently on short term disability and out of work from [place] [employer] but is wondering if he is ready to go back to work.  However, he is concerned that he still has pain in that area once he completes working with PT and doing exercises and is worried if he returns he may have similar injury. He denies any numbness or tingling in his extremities and feels like he may be 70-80% better.  He is also stressed because he has 6 children and needs to be working to help provide for his family and says he is due for a bone marrow transplant. He has not followed up with sports medicine recently and also missed his last PT appointment and has not rescheduled another visit yet.   Past Medical History  Diagnosis Date  . Asthma, cold induced   . Herpes   . Asthma   . No pertinent past medical history     enlarged spleen  . Spleen enlargement     myofibrosis need bonemarrow transplant  . Myelofibrosis     followed by Dr. Sherryl Manges  . 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 acetaminophen (TYLENOL) 325 MG tablet Take 2 tablets (650 mg total) by mouth every 4 (four) hours as needed. 100 tablet 2  . albuterol (PROVENTIL HFA;VENTOLIN HFA) 108 (90 BASE) MCG/ACT inhaler Inhale 2 puffs into the lungs every 6 (six) hours as needed for wheezing or shortness of breath.     . cephALEXin (KEFLEX) 500 MG capsule Take 1 capsule (500 mg total) by mouth 4 (four) times daily. 28 capsule 0  . diclofenac sodium (VOLTAREN) 1 % GEL Apply 2 g topically 4  (four) times daily. 100 g 0  . HYDROcodone-acetaminophen (NORCO/VICODIN) 5-325 MG per tablet Take 1 tablet by mouth every 6 (six) hours as needed for moderate pain. 40 tablet 0  . imiquimod (ALDARA) 5 % cream   6  . meloxicam (MOBIC) 15 MG tablet Take 1 tablet (15 mg total) by mouth daily. 30 tablet 2  . predniSONE (STERAPRED UNI-PAK) 10 MG tablet 6 tabs po day 1, 5 tabs po day 2, 4 tabs po day 3, 3 tabs po day 4, 2 tabs po day 5, 1 tab po day 6 21 tablet 0  . ruxolitinib phosphate (JAKAFI) 20 MG tablet Take 1 tablet (20 mg total) by mouth 2 (two) times daily. 60 tablet 11   No current facility-administered medications for this visit.   No family history on file. History   Social History  . Marital Status: Single    Spouse Name: N/A  . Number of Children: N/A  . Years of Education: N/A   Social History Main Topics  . Smoking status: Current Some Day Smoker -- 0.20 packs/day    Types: Cigarettes  . Smokeless tobacco: Not on file     Comment: 1 2 cigs per day  . Alcohol Use: 0.0 oz/week    0 Standard drinks or equivalent per week     Comment:  daily   . Drug Use: No  . Sexual Activity: Not on file   Other Topics Concern  . None   Social History Narrative   Review of Systems:  Constitutional:  Denies fever, chills   HEENT:  +neck pain  Respiratory:  Denies SOB   Cardiovascular:  Denies chest pain  Gastrointestinal:  Occasional abdominal pain  Genitourinary:  Denies dysuria  Musculoskeletal:  L shoulder and neck pain   Objective:  Physical Exam: Filed Vitals:   03/05/15 1346 03/05/15 1426  BP: 158/85 141/79  Pulse: 85 75  Temp: 98.5 F (36.9 C)   TempSrc: Oral   Weight: 223 lb (101.152 kg)   SpO2: 100%    Vitals reviewed. General: sitting in chair, NAD HEENT: mild scleral icterus Cardiac: RRR Pulm: clear to auscultation bilaterally, no wheezes, rales, or rhonchi Abd: soft, nontender, nondistended, BS present Ext: warm and well perfused, moving all extremities,  good ROM of b/l upper extremities, able to raise arms above head, flex and extend without pain. No tenderness to palpation of neck or L shoulder.He was able to raise a weight of 6.5lb's above his head at least 30 times on both sides and reported mild discomfort in the end on the left shoulder/neck that spontaneously resolved.   Neuro: alert and oriented X3, strength and sensation to light touch equal in bilateral upper and lower extremities  Assessment & Plan:  Discussed with Dr. Lynnae January

## 2015-03-07 DIAGNOSIS — I10 Essential (primary) hypertension: Secondary | ICD-10-CM | POA: Insufficient documentation

## 2015-03-07 NOTE — Assessment & Plan Note (Signed)
Future labs were ordered by Dr. Beryle Beams and drawn by lab this office visit.   Follow up with Dr. Beryle Beams and labs to be reviewed by him as well.  I will let him know patient did get his lab work done this visit.

## 2015-03-07 NOTE — Assessment & Plan Note (Addendum)
Sean Walter continues to complain of some pain at the site of his original injury lateral neck and L shoulder region after working with PT and doing the exercises. He would like to return to work as he needs the income, however, he is concerned given that he still has some pain and he wonders if he goes back to work if it will be the same thing again. He works for [employer] and has to do heavy lifting. He is currently on short term disability with his PCP. Last PT note from 5/9 (visit 12/13), notes him lifting 20-50 pounds and no pain however increase in HR and BP while lifting. He says he has not scheduled another appointment with them yet and he also did not follow up with sports medicine since February.  Today in clinic, his ROM is intact and he has no pain at baseline or on palpation. The only weight available we had in clinic was 6.5lbs and he was able to raise that above his head at least 30 times with no difficulty, however, he did endorse some mild discomfort on the left side of his neck and L shoulder near the end that resolved.   Given that Sean Walter still reports pain in the same area after working with PT, I am not sure if he is able to return back to work at this time. I will defer this decision to PCP who is more familiar with the patient and has been completing his short term disability. In the meantime, I do recommend that he return for his next PT visit and to discuss with PT if they feel like he has met his goals.  He should also likely follow up with sports medicine since he is overdue and they may be able to provide some further insight as well.  Finally, when I discussed Sean Walter visit with Dr. Posey Pronto in clinic briefly, he mentioned that he has to lift up to ~100lb's of weight while at work, but per PT note he has been lifting 20-50lbs in his exercises, thus again, not sure if he can indeed do such heavy lifting as required by his job--this will need further clarification by PT.  -I have also  asked Sean Walter to check with his employer if light duty is a possibility as this may be another option for PCP and him to discuss and try to be able to return to work. He initially in clinic mentioned that is likely not possible given the nature of his job, however, he said he would ask.  If he is able to work with restrictions on heavy weight lifting, that may be a reasonable option if PCP agrees.  -monitor BP and HR while lifting and at rest, if persistently elevated, he may need to be started on medication -follow up in ~1 week with pcp and discuss clearance to return to work at that time ( or pcp may wish to make that decision without another follow up visit) -pcp filled pain medication prior to office visit for patient pick up

## 2015-03-07 NOTE — Assessment & Plan Note (Signed)
abd u/s ordered by Dr. Beryle Beams and scheduled by front desk

## 2015-03-07 NOTE — Assessment & Plan Note (Addendum)
Initially 158/85 but improved to 141/79 on repeat. BP may be elevated in setting of stress in regards to current situation with his pain and returning back to work. He is also noted to have increase in HR and BP while working with PT and looking back, he has had some elevated values on other visits as well (past elevated BP recordings on epic appear to be during PT sessions with heavy lifting)  -will monitor for now and recheck on follow up visit. If remains elevated, consider initiating treatment.

## 2015-03-07 NOTE — Assessment & Plan Note (Signed)
Follows with Dr. Beryle Beams. He reports having splenomegaly and in need of bone marrow transplant and has been in touch with [facility].   We did not address this issue in detail during this office visit as he was here for an acute visit in regards to his neck/shoulder pain. However, he had future labs placed by Dr. Beryle Beams that he had drawn and I will let Dr. Beryle Beams know to follow results.   -Follow up with Dr. Beryle Beams

## 2015-03-09 NOTE — Progress Notes (Signed)
Internal Medicine Clinic Attending  Case discussed with Dr. Qureshi soon after the resident saw the patient.  We reviewed the resident's history and exam and pertinent patient test results.  I agree with the assessment, diagnosis, and plan of care documented in the resident's note. 

## 2015-03-10 ENCOUNTER — Ambulatory Visit: Payer: BLUE CROSS/BLUE SHIELD

## 2015-03-10 ENCOUNTER — Encounter: Payer: Self-pay | Admitting: Internal Medicine

## 2015-03-10 NOTE — Progress Notes (Addendum)
Patient ID: Sean Walter, male   DOB: 1970-12-18, 44 y.o.   MRN: [id number]  I'm receiving paperwork for short-term disability and am somewhat puzzled as to why he has not recovered with regards to his muscle pain.   His most recent PT session was 5/9 at which time there was concern for elevated blood pressure and tachycardia, but it is unclear if these vitals were taken after his therapy. Values are consistent with the session before that as noted below.  5/9: BP 155/90, HR 103 5/4: BP 165/100, HR 88  As noted on his office visit 5/13, blood pressure was 141/79 on repeat. May be elevated in the setting of psychological stress.  I then spoke to him by phone, and he is in agreement with return to work on Monday, 5/23. He expressed concerns to me about his pain regimen. For his pain, he is currently taking 1 pill/day but has required 2-4 pills/day in the past because he feels his twisting/moving irritates his spleen at home. He knows that we cannot give him an excessive amount but would like to be preemptive as he knows he will be exerting himself at his job. I explained to him that I will touch base with his hematologist since it was decided at her prior office visit to taper his medication.   I wanted to ensure him that we were on the same page about his plan, and he acknowledged agreement in making sure we were acting on his behalf.  I have a feeling he will ask for premature refill this medication but figured it would be better that he goes back to work and than to continue to sit home. Looking at his pain requirement, he is asking for somewhere between 60-160 tablets per month assuming 2-4 pills/day  30 days. As you have had more experience with patients who suffer from splenomegaly, I trust your judgment when it comes to the pain they describe. I was considering refilling 50-60 tablets for an impending refill request which would put him back at the number he was before we started the taper back  in January.

## 2015-03-10 NOTE — Progress Notes (Signed)
I agree with the 50-60/month limit.  Most of my splenomegaly patients are not on narcotic analgesics. He has taken to using the percocet for his shoulder problems. I don't want him to get used to taking them for every ache and pain. DrG

## 2015-03-11 ENCOUNTER — Ambulatory Visit: Payer: BLUE CROSS/BLUE SHIELD

## 2015-03-11 VITALS — BP 163/100 | HR 86

## 2015-03-11 DIAGNOSIS — M542 Cervicalgia: Secondary | ICD-10-CM | POA: Diagnosis not present

## 2015-03-11 NOTE — Therapy (Signed)
[place], [place], [address] Phone: [phone]   Fax:  [phone]  Physical Therapy Treatment  Patient Details  Name: Sean Walter MRN: [id number] Date of Birth: Jan 12, 1971 Referring Provider:  Riccardo Dubin, MD  Encounter Date: 03/11/2015      PT End of Session - 03/11/15 1139    Visit Number 13   Number of Visits 13   PT Start Time 1103   PT Stop Time 1145   PT Time Calculation (min) 42 min   Activity Tolerance Patient tolerated treatment well   Behavior During Therapy [facility] for tasks assessed/performed      Past Medical History  Diagnosis Date  . Asthma, cold induced   . Herpes   . Asthma   . No pertinent past medical history     enlarged spleen  . Spleen enlargement     myofibrosis need bonemarrow transplant  . Myelofibrosis     followed by Dr. Sherryl Walter  . 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 Mandible fracture surgery    . Mandibular hardware removal  04/10/2012    Procedure: MANDIBULAR HARDWARE REMOVAL;  Surgeon: Sean Marble, MD;  Location: [facility];  Service: ENT;  Laterality: Right;    Filed Vitals:   03/11/15 1117  BP: 163/100  Pulse: 86    Visit Diagnosis:  Pain in neck      Subjective Assessment - 03/11/15 1103    Subjective No pain . Doing ok. He will RTW next week.    Currently in Pain? No/denies            [facility] PT Assessment - 03/11/15 0001    AROM   Cervical - Right Side Bend 47   Cervical - Left Side Bend 47   Cervical - Right Rotation 75   Cervical - Left Rotation 75                     [facility] Adult PT Treatment/Exercise - 03/11/15 1111    Neck Exercises: Machines for Strengthening   UBE (Upper Arm Bike) Sci fit L2 1/2 forward , 1/2 back   Neck Exercises: Standing   Other Standing Exercises  Lifting 30 pounds from 6 inch and 12 inc to waist height and to shoulder height x 5, HR  111  BP 180/105 , aafter 2 min rest HR 82  and BP  140/90,   Then lifted 50 pounds 5 sets as with 30 pounds  HR  125  BP 185/102, after 2 min rest  HR 90   BP  145/98  then 5 sets with 30 pounds  HR 125  BP  172 98, 2 min rest  HR 90  BP  140/100 and one last set 5 sets with 50 pounds  HR  125 BP 175/100, then after 2 min rest  HR 95  BP  150/98                  PT Short Term Goals - 03/11/15 1152    PT SHORT TERM GOAL #1   Title He will be indepndent with inital HEP   Status Achieved   PT SHORT TERM GOAL #2   Title He will report pain decreased 30% or more  with home activity and assisting children   Status Achieved  PT Long Term Goals - 03/11/15 1152    PT LONG TERM GOAL #1   Title He will be independent with all HEP issued as of last visit   Status Achieved   PT LONG TERM GOAL #2   Title He will report pain decreased 75% or more with work tasks    Status Achieved   PT LONG TERM GOAL #3   Title He will report able to feed child without pain   Status Achieved               Plan - 03/11/15 1144    Clinical Impression Statement Continues with elevated BP though he recovesrs to lower BP and HR though both still high with 2 min rest. He did not have pain after this. Again BP may be concern with continuous heavy lifting.  Will discharge to RTW next week   PT Next Visit Plan  Discharge today   Consulted and Agree with Plan of Care Patient        Problem List Patient Active Problem List   Diagnosis Date Noted  . Elevated blood pressure 03/07/2015  . Neck pain 12/14/2014  . Left shoulder pain 11/24/2014  . Folliculitis 53/61/4431  . Lower back pain 10/27/2014  . 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 Primary myelofibrosis   . Spleen enlargement     Sean Walter PT 03/11/2015, 11:52  AM  [facility] [address] [place], [place], [address] Phone: [phone]   Fax:  [phone]   PHYSICAL THERAPY DISCHARGE SUMMARY  Visits from Start of Care: 13  Current functional level related to goals / functional outcomes: See above   Remaining deficits: He has occasional neck pain and his BP is elevated and a potential concern for repetitive heavy lifting. He should be able to handle the weight physically but we are not able to mimic a full  Day work   Scientist, physiological / Equipment: HEP. Lifting technique and neck range and stability Plan: Patient agrees to discharge.  Patient goals were met. Patient is being discharged due to meeting the stated rehab goals.  ?????

## 2015-03-11 NOTE — Progress Notes (Signed)
Patient ID: Sean Walter, male   DOB: 1971-02-03, 44 y.o.   MRN: [id number]  I just wanted to be sure, and I appreciate the insight. I will have the triage RN convey this message to him accordingly when he calls for his refill.

## 2015-03-16 ENCOUNTER — Ambulatory Visit (HOSPITAL_COMMUNITY): Admission: RE | Admit: 2015-03-16 | Payer: BLUE CROSS/BLUE SHIELD | Source: Ambulatory Visit

## 2015-03-23 ENCOUNTER — Other Ambulatory Visit: Payer: BC Managed Care – PPO

## 2015-04-01 ENCOUNTER — Other Ambulatory Visit: Payer: Self-pay | Admitting: *Deleted

## 2015-04-01 MED ORDER — HYDROCODONE-ACETAMINOPHEN 5-325 MG PO TABS: 1.0000 | ORAL_TABLET | Freq: Four times a day (QID) | ORAL | Status: DC | PRN

## 2015-04-01 NOTE — Addendum Note (Signed)
Addended by: Riccardo Dubin on: 04/01/2015 05:47 PM   Modules accepted: Orders

## 2015-04-01 NOTE — Telephone Encounter (Addendum)
Per my last conversation with him on 5/18, I will refill his Norco 5/325mg  x 40 tablets. If he requests within the next 30 days, I will adjust my supply as I understand he is back to work though no more than 50-60 tablets per 30-day period.  Bonnita Nasuti, I will leave prescription in my mailbox for pickup.

## 2015-04-05 NOTE — Telephone Encounter (Signed)
Pt notified, no answer, left message

## 2015-04-06 ENCOUNTER — Other Ambulatory Visit (INDEPENDENT_AMBULATORY_CARE_PROVIDER_SITE_OTHER): Payer: BLUE CROSS/BLUE SHIELD

## 2015-04-06 DIAGNOSIS — C946 Myelodysplastic disease, not classified: Secondary | ICD-10-CM

## 2015-04-06 DIAGNOSIS — D471 Chronic myeloproliferative disease: Secondary | ICD-10-CM | POA: Diagnosis not present

## 2015-04-06 DIAGNOSIS — R161 Splenomegaly, not elsewhere classified: Secondary | ICD-10-CM

## 2015-04-06 DIAGNOSIS — D47Z9 Other specified neoplasms of uncertain behavior of lymphoid, hematopoietic and related tissue: Secondary | ICD-10-CM

## 2015-04-06 LAB — CBC WITH DIFFERENTIAL/PLATELET
BASOS PCT: 1 % (ref 0–1)
Basophils Absolute: 0.1 10*3/uL (ref 0.0–0.1)
EOS ABS: 0.1 10*3/uL (ref 0.0–0.7)
EOS PCT: 1 % (ref 0–5)
HEMATOCRIT: 40.2 % (ref 39.0–52.0)
HEMOGLOBIN: 13.4 g/dL (ref 13.0–17.0)
Lymphocytes Relative: 11 % — ABNORMAL LOW (ref 12–46)
Lymphs Abs: 0.7 10*3/uL (ref 0.7–4.0)
MCH: 26.5 pg (ref 26.0–34.0)
MCHC: 33.3 g/dL (ref 30.0–36.0)
MCV: 79.4 fL (ref 78.0–100.0)
Monocytes Absolute: 0.2 10*3/uL (ref 0.1–1.0)
Monocytes Relative: 4 % (ref 3–12)
NEUTROS PCT: 83 % — AB (ref 43–77)
Neutro Abs: 5.1 10*3/uL (ref 1.7–7.7)
Platelets: 224 10*3/uL (ref 150–400)
RBC: 5.06 MIL/uL (ref 4.22–5.81)
RDW: 18 % — ABNORMAL HIGH (ref 11.5–15.5)
WBC: 6.1 10*3/uL (ref 4.0–10.5)

## 2015-04-06 LAB — COMPREHENSIVE METABOLIC PANEL
ALBUMIN: 5 g/dL (ref 3.5–5.2)
ALT: 28 U/L (ref 0–53)
AST: 24 U/L (ref 0–37)
Alkaline Phosphatase: 58 U/L (ref 39–117)
BUN: 10 mg/dL (ref 6–23)
CALCIUM: 9.6 mg/dL (ref 8.4–10.5)
CHLORIDE: 102 meq/L (ref 96–112)
CO2: 25 mEq/L (ref 19–32)
Creat: 1.47 mg/dL — ABNORMAL HIGH (ref 0.50–1.35)
Glucose, Bld: 93 mg/dL (ref 70–99)
POTASSIUM: 3.8 meq/L (ref 3.5–5.3)
SODIUM: 135 meq/L (ref 135–145)
Total Bilirubin: 0.7 mg/dL (ref 0.2–1.2)
Total Protein: 7.9 g/dL (ref 6.0–8.3)

## 2015-04-06 LAB — URIC ACID: Uric Acid, Serum: 6.1 mg/dL (ref 4.0–7.8)

## 2015-04-06 LAB — LACTATE DEHYDROGENASE: LDH: 313 U/L — ABNORMAL HIGH (ref 94–250)

## 2015-04-08 ENCOUNTER — Telehealth: Payer: Self-pay | Admitting: *Deleted

## 2015-04-08 NOTE — Telephone Encounter (Signed)
Pt called / informed blood counts are good and to stay on current dose of Jakofi per Dr Beryle Beams. Pt voiced understanding.

## 2015-04-08 NOTE — Telephone Encounter (Signed)
-----   Message from [name], MD sent at 04/07/2015  3:17 PM EDT ----- Call pt: blood counts good; stay on current dose of JAKOFI

## 2015-04-19 ENCOUNTER — Other Ambulatory Visit: Payer: BC Managed Care – PPO

## 2015-04-29 ENCOUNTER — Other Ambulatory Visit: Payer: Self-pay | Admitting: Oncology

## 2015-04-29 NOTE — Telephone Encounter (Signed)
Last refill 6/14 for # 40

## 2015-04-29 NOTE — Telephone Encounter (Signed)
Patient calling asking for refills for monthly meds that he picks up from here

## 2015-04-30 ENCOUNTER — Other Ambulatory Visit (INDEPENDENT_AMBULATORY_CARE_PROVIDER_SITE_OTHER): Payer: BLUE CROSS/BLUE SHIELD

## 2015-04-30 DIAGNOSIS — D47Z9 Other specified neoplasms of uncertain behavior of lymphoid, hematopoietic and related tissue: Secondary | ICD-10-CM

## 2015-04-30 DIAGNOSIS — R161 Splenomegaly, not elsewhere classified: Secondary | ICD-10-CM | POA: Diagnosis not present

## 2015-04-30 DIAGNOSIS — C946 Myelodysplastic disease, not classified: Secondary | ICD-10-CM

## 2015-04-30 DIAGNOSIS — D471 Chronic myeloproliferative disease: Secondary | ICD-10-CM | POA: Diagnosis not present

## 2015-04-30 LAB — CBC WITH DIFFERENTIAL/PLATELET
Basophils Absolute: 0.1 10*3/uL (ref 0.0–0.1)
Basophils Relative: 1 % (ref 0–1)
EOS ABS: 0.1 10*3/uL (ref 0.0–0.7)
EOS PCT: 1 % (ref 0–5)
HEMATOCRIT: 41.3 % (ref 39.0–52.0)
HEMOGLOBIN: 13.5 g/dL (ref 13.0–17.0)
LYMPHS ABS: 0.5 10*3/uL — AB (ref 0.7–4.0)
Lymphocytes Relative: 7 % — ABNORMAL LOW (ref 12–46)
MCH: 26.6 pg (ref 26.0–34.0)
MCHC: 32.7 g/dL (ref 30.0–36.0)
MCV: 81.3 fL (ref 78.0–100.0)
MONO ABS: 0.2 10*3/uL (ref 0.1–1.0)
MONOS PCT: 3 % (ref 3–12)
NEUTROS PCT: 88 % — AB (ref 43–77)
Neutro Abs: 6.7 10*3/uL (ref 1.7–7.7)
Platelets: 256 10*3/uL (ref 150–400)
RBC: 5.08 MIL/uL (ref 4.22–5.81)
RDW: 17 % — AB (ref 11.5–15.5)
WBC: 7.6 10*3/uL (ref 4.0–10.5)

## 2015-04-30 LAB — COMPREHENSIVE METABOLIC PANEL
ALT: 29 U/L (ref 0–53)
AST: 23 U/L (ref 0–37)
Albumin: 4.5 g/dL (ref 3.5–5.2)
Alkaline Phosphatase: 60 U/L (ref 39–117)
BUN: 7 mg/dL (ref 6–23)
CO2: 26 meq/L (ref 19–32)
Calcium: 9.2 mg/dL (ref 8.4–10.5)
Chloride: 102 mEq/L (ref 96–112)
Creat: 1.21 mg/dL (ref 0.50–1.35)
Glucose, Bld: 112 mg/dL — ABNORMAL HIGH (ref 70–99)
Potassium: 3.7 mEq/L (ref 3.5–5.3)
Sodium: 139 mEq/L (ref 135–145)
TOTAL PROTEIN: 7.7 g/dL (ref 6.0–8.3)
Total Bilirubin: 0.5 mg/dL (ref 0.2–1.2)

## 2015-04-30 LAB — URIC ACID: Uric Acid, Serum: 6.1 mg/dL (ref 4.0–7.8)

## 2015-04-30 LAB — LACTATE DEHYDROGENASE: LDH: 334 U/L — AB (ref 94–250)

## 2015-04-30 MED ORDER — HYDROCODONE-ACETAMINOPHEN 5-325 MG PO TABS: 1.0000 | ORAL_TABLET | Freq: Four times a day (QID) | ORAL | Status: DC | PRN

## 2015-04-30 NOTE — Telephone Encounter (Signed)
Rx written and pt informed

## 2015-04-30 NOTE — Telephone Encounter (Signed)
Pt came by clinic to get Rx.   [place] and wanted to get it early.  I told him he may not be able to get it filled in [place] Will you fill for him?

## 2015-05-03 ENCOUNTER — Encounter: Payer: Self-pay | Admitting: Oncology

## 2015-05-03 ENCOUNTER — Telehealth: Payer: Self-pay | Admitting: *Deleted

## 2015-05-03 ENCOUNTER — Ambulatory Visit: Payer: BLUE CROSS/BLUE SHIELD | Admitting: Oncology

## 2015-05-03 NOTE — Telephone Encounter (Signed)
Pt called - no answer; left message blood counts are good and to continue same dose of Jakofi per Dr Beryle Beams. And to call if he has any questions.

## 2015-05-03 NOTE — Telephone Encounter (Signed)
-----   Message from [name], MD sent at 05/03/2015  8:08 AM EDT ----- Call pt: blood counts good; continue current dose of [place]

## 2015-05-03 NOTE — Progress Notes (Signed)
Patient ID: Sean Walter, male   DOB: 1971/07/30, 44 y.o.   MRN: [id number] 44 year old man with a myeloproliferative disorder/early myelofibrosis with associated splenomegaly on treatment with Jakofi. He came in for labs on Friday but failed to report for his visit today. Lab work all looks very good. I will continue his current dose of 20 mg twice daily. Reschedule his M.D. Appointment.

## 2015-05-26 NOTE — Telephone Encounter (Signed)
finished

## 2015-05-31 ENCOUNTER — Other Ambulatory Visit: Payer: Self-pay | Admitting: *Deleted

## 2015-05-31 NOTE — Telephone Encounter (Signed)
It appears he did not make it to his last appointment with Dr. Beryle Beams. Please inform that he needs an appointment scheduled before we refill as we are both managing his pain medication.

## 2015-05-31 NOTE — Telephone Encounter (Signed)
Last appt 5/13; no future appt. Last refill - 04/30/15.

## 2015-05-31 NOTE — Telephone Encounter (Signed)
Pt called/informed he needs to schedule an appt.

## 2015-06-01 MED ORDER — HYDROCODONE-ACETAMINOPHEN 5-325 MG PO TABS: 1.0000 | ORAL_TABLET | Freq: Four times a day (QID) | ORAL | Status: DC | PRN

## 2015-06-01 NOTE — Telephone Encounter (Signed)
Called pt to cancel his appt tomorrow 8/10 and to schedule appt w/Dr Beryle Beams - no answer; left message. I will send message to Josephina Shih.

## 2015-06-01 NOTE — Telephone Encounter (Signed)
Talked to Dr Posey Pronto - stated pt needs to schedule and keep his appts w/Dr Beryle Beams and when he schedule the appt, he will refill his medication for this one time only. Message relayed to pt ;stated he doesn't mind making an appt w/Dr Beryle Beams.and voiced understanding this is a one time occurrence; importance of keeping his appts. Awared Josephina Shih will call him.

## 2015-06-01 NOTE — Telephone Encounter (Signed)
Appt has been scheduled on 8/10.

## 2015-06-01 NOTE — Telephone Encounter (Signed)
I spoke with Dr. Beryle Beams, and we need to schedule a follow-up appointment with him given the current agent he is on for his myelofibrosis which requires close monitoring. He does not need to follow-up in the [facility] before we refill his pain medication.

## 2015-06-01 NOTE — Telephone Encounter (Signed)
I will go ahead and leave the signed prescription in my mailbox. When you have confirmed he has scheduled an appointment with Dr. Beryle Beams in our system, please dispense to him.

## 2015-06-01 NOTE — Addendum Note (Signed)
Addended by: Riccardo Dubin on: 06/01/2015 06:15 PM   Modules accepted: Orders

## 2015-06-02 ENCOUNTER — Other Ambulatory Visit: Payer: Self-pay | Admitting: Internal Medicine

## 2015-06-02 ENCOUNTER — Ambulatory Visit: Payer: BLUE CROSS/BLUE SHIELD | Admitting: Internal Medicine

## 2015-06-02 NOTE — Telephone Encounter (Signed)
Pt called requesting the nurse to call back about pain med.

## 2015-06-02 NOTE — Telephone Encounter (Signed)
An appt has been scheduled w/Dr Beryle Beams on 8/29. Rx ready - pt called/informed.

## 2015-06-02 NOTE — Telephone Encounter (Signed)
Rx given to pt. 

## 2015-06-21 ENCOUNTER — Encounter: Payer: Self-pay | Admitting: Oncology

## 2015-06-21 ENCOUNTER — Ambulatory Visit (INDEPENDENT_AMBULATORY_CARE_PROVIDER_SITE_OTHER): Payer: BLUE CROSS/BLUE SHIELD | Admitting: Oncology

## 2015-06-21 VITALS — BP 128/78 | HR 64 | Temp 98.2°F | Ht 72.0 in | Wt 219.3 lb

## 2015-06-21 DIAGNOSIS — C946 Myelodysplastic disease, not classified: Secondary | ICD-10-CM

## 2015-06-21 DIAGNOSIS — B081 Molluscum contagiosum: Secondary | ICD-10-CM

## 2015-06-21 DIAGNOSIS — F1920 Other psychoactive substance dependence, uncomplicated: Secondary | ICD-10-CM

## 2015-06-21 DIAGNOSIS — D471 Chronic myeloproliferative disease: Secondary | ICD-10-CM

## 2015-06-21 DIAGNOSIS — R161 Splenomegaly, not elsewhere classified: Secondary | ICD-10-CM

## 2015-06-21 DIAGNOSIS — R7989 Other specified abnormal findings of blood chemistry: Secondary | ICD-10-CM

## 2015-06-21 DIAGNOSIS — D474 Osteomyelofibrosis: Secondary | ICD-10-CM | POA: Diagnosis not present

## 2015-06-21 DIAGNOSIS — D47Z9 Other specified neoplasms of uncertain behavior of lymphoid, hematopoietic and related tissue: Secondary | ICD-10-CM
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
## 2015-06-21 NOTE — Patient Instructions (Signed)
Return for lab tomorrow at 9 AM then lab every month Return visit with Dr Darnell Level in 3-4 months Continue Jakofi at current dose

## 2015-06-22 ENCOUNTER — Other Ambulatory Visit (INDEPENDENT_AMBULATORY_CARE_PROVIDER_SITE_OTHER): Payer: BLUE CROSS/BLUE SHIELD

## 2015-06-22 ENCOUNTER — Telehealth: Payer: Self-pay | Admitting: *Deleted

## 2015-06-22 DIAGNOSIS — C946 Myelodysplastic disease, not classified: Secondary | ICD-10-CM

## 2015-06-22 DIAGNOSIS — D471 Chronic myeloproliferative disease: Secondary | ICD-10-CM | POA: Diagnosis not present

## 2015-06-22 DIAGNOSIS — D47Z9 Other specified neoplasms of uncertain behavior of lymphoid, hematopoietic and related tissue: Secondary | ICD-10-CM

## 2015-06-22 NOTE — Telephone Encounter (Signed)
Pt called, no answer - left message Abd U/S appt 9/9 @ 0830AM here at [facility]; arrive @ 0815AM and NPO 6 hrs prior to test. And to call [phone] if need to change/cancel this appt.

## 2015-06-23 LAB — CBC WITH DIFFERENTIAL/PLATELET
BASOS ABS: 0 10*3/uL (ref 0.0–0.2)
Basos: 0 %
EOS (ABSOLUTE): 0.1 10*3/uL (ref 0.0–0.4)
Eos: 1 %
Hematocrit: 43 % (ref 37.5–51.0)
Hemoglobin: 13.8 g/dL (ref 12.6–17.7)
IMMATURE GRANULOCYTES: 0 %
Immature Grans (Abs): 0 10*3/uL (ref 0.0–0.1)
LYMPHS: 12 %
Lymphocytes Absolute: 0.6 10*3/uL — ABNORMAL LOW (ref 0.7–3.1)
MCH: 26.1 pg — ABNORMAL LOW (ref 26.6–33.0)
MCHC: 32.1 g/dL (ref 31.5–35.7)
MCV: 81 fL (ref 79–97)
MONOCYTES: 5 %
Monocytes Absolute: 0.3 10*3/uL (ref 0.1–0.9)
NEUTROS ABS: 4.2 10*3/uL (ref 1.4–7.0)
Neutrophils: 82 %
PLATELETS: 248 10*3/uL (ref 150–379)
RBC: 5.28 x10E6/uL (ref 4.14–5.80)
RDW: 15.2 % (ref 12.3–15.4)
WBC: 5.1 10*3/uL (ref 3.4–10.8)

## 2015-06-23 LAB — COMPREHENSIVE METABOLIC PANEL
ALBUMIN: 4.4 g/dL (ref 3.5–5.5)
ALT: 27 IU/L (ref 0–44)
AST: 21 IU/L (ref 0–40)
Albumin/Globulin Ratio: 1.6 (ref 1.1–2.5)
Alkaline Phosphatase: 61 IU/L (ref 39–117)
BUN / CREAT RATIO: 7 — AB (ref 9–20)
BUN: 10 mg/dL (ref 6–24)
Bilirubin Total: 0.4 mg/dL (ref 0.0–1.2)
CALCIUM: 9 mg/dL (ref 8.7–10.2)
CO2: 22 mmol/L (ref 18–29)
CREATININE: 1.34 mg/dL — AB (ref 0.76–1.27)
Chloride: 96 mmol/L — ABNORMAL LOW (ref 97–108)
GFR, EST AFRICAN AMERICAN: 74 mL/min/{1.73_m2} (ref >59)
GFR, EST NON AFRICAN AMERICAN: 64 mL/min/{1.73_m2} (ref >59)
GLUCOSE: 99 mg/dL (ref 65–99)
Globulin, Total: 2.7 g/dL (ref 1.5–4.5)
Potassium: 4.1 mmol/L (ref 3.5–5.2)
Sodium: 137 mmol/L (ref 134–144)
TOTAL PROTEIN: 7.1 g/dL (ref 6.0–8.5)

## 2015-06-23 LAB — LACTATE DEHYDROGENASE: LDH: 277 IU/L — ABNORMAL HIGH (ref 121–224)

## 2015-06-23 NOTE — Telephone Encounter (Signed)
Pt called / stated he will be out of town on  9/9. Appt changed to Friday 9/16 @ 0830AM - pt informed.

## 2015-06-29 ENCOUNTER — Other Ambulatory Visit: Payer: Self-pay | Admitting: Internal Medicine

## 2015-06-29 NOTE — Telephone Encounter (Signed)
Last filled at [facility] [address] 06/02/2015 Last appt 8/29, next appt w/ dr Beryle Beams 10/2015 Cannot find UDS

## 2015-06-29 NOTE — Telephone Encounter (Signed)
Pt called requesting hydrocodone to be filled. Pt will go to [place] on Thursday.

## 2015-06-30 NOTE — Telephone Encounter (Signed)
Sean Walter, I was taking an exam all day and will be out of town starting tomorrow.   Any way you can fill this medication Dr. Darnell Level?

## 2015-07-01 MED ORDER — HYDROCODONE-ACETAMINOPHEN 5-325 MG PO TABS: 1.0000 | ORAL_TABLET | Freq: Four times a day (QID) | ORAL | Status: DC | PRN

## 2015-07-01 NOTE — Telephone Encounter (Signed)
Rx ready - pt called, no answer; left message he can pick up rx tomorrow@ 0930AM.

## 2015-07-02 ENCOUNTER — Ambulatory Visit (HOSPITAL_COMMUNITY): Payer: BLUE CROSS/BLUE SHIELD

## 2015-07-08 ENCOUNTER — Ambulatory Visit (HOSPITAL_COMMUNITY)
Admission: RE | Admit: 2015-07-08 | Discharge: 2015-07-08 | Disposition: A | Discharge status: Home / Self Care | Payer: BLUE CROSS/BLUE SHIELD | Source: Ambulatory Visit | Attending: Oncology | Admitting: Oncology

## 2015-07-08 ENCOUNTER — Other Ambulatory Visit: Payer: Self-pay | Admitting: Oncology

## 2015-07-08 DIAGNOSIS — C946 Myelodysplastic disease, not classified: Secondary | ICD-10-CM

## 2015-07-08 DIAGNOSIS — D47Z9 Other specified neoplasms of uncertain behavior of lymphoid, hematopoietic and related tissue: Secondary | ICD-10-CM

## 2015-07-08 DIAGNOSIS — D471 Chronic myeloproliferative disease: Secondary | ICD-10-CM

## 2015-07-08 DIAGNOSIS — R161 Splenomegaly, not elsewhere classified: Secondary | ICD-10-CM | POA: Diagnosis not present

## 2015-07-09 ENCOUNTER — Ambulatory Visit (HOSPITAL_COMMUNITY): Payer: BLUE CROSS/BLUE SHIELD

## 2015-07-13 ENCOUNTER — Telehealth: Payer: Self-pay | Admitting: *Deleted

## 2015-07-13 NOTE — Telephone Encounter (Signed)
Pt called / informed of Abd [place] result - spleen slightly smaller than last time per Dr Beryle Beams.

## 2015-07-13 NOTE — Telephone Encounter (Signed)
-----   Message from [name], MD sent at 07/12/2015  5:34 PM EDT ----- Call pt: spleen slightly smaller than last time

## 2015-07-23 ENCOUNTER — Other Ambulatory Visit (INDEPENDENT_AMBULATORY_CARE_PROVIDER_SITE_OTHER): Payer: BLUE CROSS/BLUE SHIELD

## 2015-07-23 DIAGNOSIS — D47Z9 Other specified neoplasms of uncertain behavior of lymphoid, hematopoietic and related tissue: Secondary | ICD-10-CM

## 2015-07-23 DIAGNOSIS — C946 Myelodysplastic disease, not classified: Secondary | ICD-10-CM

## 2015-07-23 DIAGNOSIS — D471 Chronic myeloproliferative disease: Secondary | ICD-10-CM

## 2015-07-24 LAB — CBC WITH DIFFERENTIAL/PLATELET
BASOS ABS: 0.1 10*3/uL (ref 0.0–0.2)
Basos: 1 %
EOS (ABSOLUTE): 0.1 10*3/uL (ref 0.0–0.4)
Eos: 1 %
Hematocrit: 41.1 % (ref 37.5–51.0)
Hemoglobin: 13.4 g/dL (ref 12.6–17.7)
IMMATURE GRANS (ABS): 0 10*3/uL (ref 0.0–0.1)
IMMATURE GRANULOCYTES: 0 %
LYMPHS: 11 %
Lymphocytes Absolute: 0.7 10*3/uL (ref 0.7–3.1)
MCH: 25.7 pg — ABNORMAL LOW (ref 26.6–33.0)
MCHC: 32.6 g/dL (ref 31.5–35.7)
MCV: 79 fL (ref 79–97)
Monocytes Absolute: 0.2 10*3/uL (ref 0.1–0.9)
Monocytes: 2 %
NEUTROS PCT: 85 %
Neutrophils Absolute: 5.2 10*3/uL (ref 1.4–7.0)
PLATELETS: 275 10*3/uL (ref 150–379)
RBC: 5.21 x10E6/uL (ref 4.14–5.80)
RDW: 16.1 % — ABNORMAL HIGH (ref 12.3–15.4)
WBC: 6.2 10*3/uL (ref 3.4–10.8)

## 2015-07-24 LAB — COMPREHENSIVE METABOLIC PANEL
ALBUMIN: 4.5 g/dL (ref 3.5–5.5)
ALT: 33 IU/L (ref 0–44)
AST: 25 IU/L (ref 0–40)
Albumin/Globulin Ratio: 1.7 (ref 1.1–2.5)
Alkaline Phosphatase: 63 IU/L (ref 39–117)
BUN / CREAT RATIO: 10 (ref 9–20)
BUN: 12 mg/dL (ref 6–24)
Bilirubin Total: 0.3 mg/dL (ref 0.0–1.2)
CALCIUM: 8.9 mg/dL (ref 8.7–10.2)
CO2: 24 mmol/L (ref 18–29)
CREATININE: 1.26 mg/dL (ref 0.76–1.27)
Chloride: 100 mmol/L (ref 97–108)
GFR, EST AFRICAN AMERICAN: 80 mL/min/{1.73_m2} (ref >59)
GFR, EST NON AFRICAN AMERICAN: 69 mL/min/{1.73_m2} (ref >59)
GLOBULIN, TOTAL: 2.6 g/dL (ref 1.5–4.5)
Glucose: 76 mg/dL (ref 65–99)
Potassium: 3.9 mmol/L (ref 3.5–5.2)
SODIUM: 140 mmol/L (ref 134–144)
TOTAL PROTEIN: 7.1 g/dL (ref 6.0–8.5)

## 2015-07-24 LAB — LACTATE DEHYDROGENASE: LDH: 318 IU/L — ABNORMAL HIGH (ref 121–224)

## 2015-07-29 ENCOUNTER — Other Ambulatory Visit: Payer: Self-pay | Admitting: Oncology

## 2015-07-29 MED ORDER — HYDROCODONE-ACETAMINOPHEN 5-325 MG PO TABS: 1.0000 | ORAL_TABLET | Freq: Four times a day (QID) | ORAL | Status: DC | PRN

## 2015-07-30 ENCOUNTER — Other Ambulatory Visit: Payer: Self-pay | Admitting: Internal Medicine

## 2015-07-30 NOTE — Telephone Encounter (Signed)
Pt called requesting Vicodin to be filled.

## 2015-07-30 NOTE — Telephone Encounter (Signed)
Pt called, informed.

## 2015-08-20 ENCOUNTER — Other Ambulatory Visit: Payer: BLUE CROSS/BLUE SHIELD

## 2015-08-26 ENCOUNTER — Other Ambulatory Visit: Payer: Self-pay | Admitting: Internal Medicine

## 2015-08-26 ENCOUNTER — Other Ambulatory Visit: Payer: Self-pay | Admitting: *Deleted

## 2015-08-26 NOTE — Telephone Encounter (Signed)
Pt requesting hydrocodone to be filled.  °

## 2015-08-26 NOTE — Telephone Encounter (Signed)
Lat refill 07/29/15. Last appt w/Dr G 8/29; next appt 10/2015. Last appt Dr Eula Fried 03/05/15.

## 2015-08-27 ENCOUNTER — Other Ambulatory Visit (INDEPENDENT_AMBULATORY_CARE_PROVIDER_SITE_OTHER): Payer: BLUE CROSS/BLUE SHIELD

## 2015-08-27 DIAGNOSIS — D471 Chronic myeloproliferative disease: Secondary | ICD-10-CM

## 2015-08-27 DIAGNOSIS — D47Z9 Other specified neoplasms of uncertain behavior of lymphoid, hematopoietic and related tissue: Secondary | ICD-10-CM

## 2015-08-27 DIAGNOSIS — C946 Myelodysplastic disease, not classified: Secondary | ICD-10-CM

## 2015-08-28 LAB — COMPREHENSIVE METABOLIC PANEL
ALK PHOS: 58 IU/L (ref 39–117)
ALT: 33 IU/L (ref 0–44)
AST: 22 IU/L (ref 0–40)
Albumin/Globulin Ratio: 1.6 (ref 1.1–2.5)
Albumin: 4.7 g/dL (ref 3.5–5.5)
BILIRUBIN TOTAL: 0.5 mg/dL (ref 0.0–1.2)
BUN/Creatinine Ratio: 8 — ABNORMAL LOW (ref 9–20)
BUN: 10 mg/dL (ref 6–24)
CHLORIDE: 99 mmol/L (ref 97–106)
CO2: 23 mmol/L (ref 18–29)
CREATININE: 1.22 mg/dL (ref 0.76–1.27)
Calcium: 9.3 mg/dL (ref 8.7–10.2)
GFR calc Af Amer: 83 mL/min/{1.73_m2} (ref >59)
GFR calc non Af Amer: 72 mL/min/{1.73_m2} (ref >59)
GLOBULIN, TOTAL: 3 g/dL (ref 1.5–4.5)
GLUCOSE: 105 mg/dL — AB (ref 65–99)
Potassium: 4.3 mmol/L (ref 3.5–5.2)
SODIUM: 139 mmol/L (ref 136–144)
Total Protein: 7.7 g/dL (ref 6.0–8.5)

## 2015-08-28 LAB — CBC WITH DIFFERENTIAL/PLATELET
BASOS ABS: 0 10*3/uL (ref 0.0–0.2)
Basos: 0 %
EOS (ABSOLUTE): 0.1 10*3/uL (ref 0.0–0.4)
Eos: 1 %
Hematocrit: 41.6 % (ref 37.5–51.0)
Hemoglobin: 13.9 g/dL (ref 12.6–17.7)
Immature Grans (Abs): 0 10*3/uL (ref 0.0–0.1)
Immature Granulocytes: 0 %
LYMPHS ABS: 0.4 10*3/uL — AB (ref 0.7–3.1)
Lymphs: 5 %
MCH: 26.2 pg — ABNORMAL LOW (ref 26.6–33.0)
MCHC: 33.4 g/dL (ref 31.5–35.7)
MCV: 79 fL (ref 79–97)
MONOS ABS: 0.2 10*3/uL (ref 0.1–0.9)
Monocytes: 3 %
Neutrophils Absolute: 6.7 10*3/uL (ref 1.4–7.0)
Neutrophils: 91 %
Platelets: 234 10*3/uL (ref 150–379)
RBC: 5.3 x10E6/uL (ref 4.14–5.80)
RDW: 16.3 % — AB (ref 12.3–15.4)
WBC: 7.4 10*3/uL (ref 3.4–10.8)

## 2015-08-28 LAB — LACTATE DEHYDROGENASE: LDH: 293 IU/L — AB (ref 121–224)

## 2015-08-28 MED ORDER — HYDROCODONE-ACETAMINOPHEN 5-325 MG PO TABS: 1.0000 | ORAL_TABLET | Freq: Four times a day (QID) | ORAL | Status: DC | PRN

## 2015-08-28 NOTE — Telephone Encounter (Signed)
As this patient was seen by Dr. Beryle Beams on 06/21/15 and deemed necessary for their treatment as documented in the EHR, I will refill this prescription for Norco. Per review of the [place], this medication was last refilled 07/30/15 and has not been refilled over the time interval.  Since he is to begin a taper, I will refill for 30 tablets. He has been filling 40 tablets for the past 3 months and did indicate he would begin a taper at his last appointment.

## 2015-08-30 NOTE — Telephone Encounter (Signed)
Called to inform RX ready for pick-up

## 2015-08-31 ENCOUNTER — Telehealth: Payer: Self-pay | Admitting: *Deleted

## 2015-08-31 NOTE — Telephone Encounter (Signed)
-----   Message from Sean Belt, MD sent at 08/30/2015 12:32 PM EST ----- Call pt: counts stable; stay on current dose of Jakafi

## 2015-08-31 NOTE — Telephone Encounter (Signed)
Pt called / informed counts are stable and to stay on current dose of Jakafi per Dr Beryle Beams. Stated he needs paperwork completed in order to continue receiving the medication.

## 2015-09-21 ENCOUNTER — Other Ambulatory Visit: Payer: Self-pay | Admitting: Oncology

## 2015-09-21 MED ORDER — HYDROCODONE-ACETAMINOPHEN 5-325 MG PO TABS: 1.0000 | ORAL_TABLET | Freq: Four times a day (QID) | ORAL | Status: DC | PRN

## 2015-09-22 ENCOUNTER — Other Ambulatory Visit: Payer: BLUE CROSS/BLUE SHIELD

## 2015-09-28 ENCOUNTER — Other Ambulatory Visit: Payer: Self-pay | Admitting: Internal Medicine

## 2015-09-28 NOTE — Telephone Encounter (Signed)
Pt requesting hydrocodone to be filled.  °

## 2015-09-28 NOTE — Telephone Encounter (Signed)
Last OV with Dr. Darnell Level 8/29 Multiple no show since. Last refill 11/7 No UDS found Future apt with Dr. Darnell Level 12/30 No PCP appointments scheduled

## 2015-09-29 ENCOUNTER — Other Ambulatory Visit: Payer: Self-pay | Admitting: Internal Medicine

## 2015-09-29 MED ORDER — HYDROCODONE-ACETAMINOPHEN 5-325 MG PO TABS: 1.0000 | ORAL_TABLET | Freq: Four times a day (QID) | ORAL | Status: DC | PRN

## 2015-09-29 NOTE — Telephone Encounter (Signed)
Attempted to call pt, lvm for rtc

## 2015-09-29 NOTE — Telephone Encounter (Signed)
Per [place], he last refilled Vicodin 5/325 30 tablets on 08/30/15. I will refill this amount to last him through his next appointment on 12/30 with Dr. Beryle Beams.  I accidentally reprinted his prescription with the wrong information and thus voided it before reprinting a new one to reflect the duplicate prescriptions in the system.

## 2015-09-29 NOTE — Telephone Encounter (Signed)
Please call pt back.l

## 2015-09-29 NOTE — Telephone Encounter (Signed)
Informed script ready.

## 2015-10-01 ENCOUNTER — Other Ambulatory Visit (INDEPENDENT_AMBULATORY_CARE_PROVIDER_SITE_OTHER): Payer: BLUE CROSS/BLUE SHIELD

## 2015-10-01 DIAGNOSIS — D471 Chronic myeloproliferative disease: Secondary | ICD-10-CM | POA: Diagnosis not present

## 2015-10-01 DIAGNOSIS — D47Z9 Other specified neoplasms of uncertain behavior of lymphoid, hematopoietic and related tissue: Secondary | ICD-10-CM

## 2015-10-01 DIAGNOSIS — C946 Myelodysplastic disease, not classified: Secondary | ICD-10-CM

## 2015-10-02 LAB — COMPREHENSIVE METABOLIC PANEL
ALBUMIN: 4.3 g/dL (ref 3.5–5.5)
ALK PHOS: 74 IU/L (ref 39–117)
ALT: 22 IU/L (ref 0–44)
AST: 22 IU/L (ref 0–40)
Albumin/Globulin Ratio: 1.7 (ref 1.1–2.5)
BILIRUBIN TOTAL: 0.5 mg/dL (ref 0.0–1.2)
BUN / CREAT RATIO: 7 — AB (ref 9–20)
BUN: 10 mg/dL (ref 6–24)
CHLORIDE: 100 mmol/L (ref 97–106)
CO2: 23 mmol/L (ref 18–29)
CREATININE: 1.45 mg/dL — AB (ref 0.76–1.27)
Calcium: 9 mg/dL (ref 8.7–10.2)
GFR calc non Af Amer: 59 mL/min/{1.73_m2} — ABNORMAL LOW (ref >59)
GFR, EST AFRICAN AMERICAN: 68 mL/min/{1.73_m2} (ref >59)
GLUCOSE: 88 mg/dL (ref 65–99)
Globulin, Total: 2.6 g/dL (ref 1.5–4.5)
Potassium: 4 mmol/L (ref 3.5–5.2)
Sodium: 142 mmol/L (ref 136–144)
TOTAL PROTEIN: 6.9 g/dL (ref 6.0–8.5)

## 2015-10-02 LAB — CBC WITH DIFFERENTIAL/PLATELET
BASOS ABS: 0 10*3/uL (ref 0.0–0.2)
Basos: 0 %
EOS (ABSOLUTE): 0.1 10*3/uL (ref 0.0–0.4)
Eos: 1 %
Hematocrit: 42.9 % (ref 37.5–51.0)
Hemoglobin: 14.1 g/dL (ref 12.6–17.7)
IMMATURE GRANS (ABS): 0 10*3/uL (ref 0.0–0.1)
IMMATURE GRANULOCYTES: 0 %
LYMPHS: 6 %
Lymphocytes Absolute: 0.6 10*3/uL — ABNORMAL LOW (ref 0.7–3.1)
MCH: 25.9 pg — ABNORMAL LOW (ref 26.6–33.0)
MCHC: 32.9 g/dL (ref 31.5–35.7)
MCV: 79 fL (ref 79–97)
MONOS ABS: 0.2 10*3/uL (ref 0.1–0.9)
Monocytes: 3 %
NEUTROS PCT: 90 %
Neutrophils Absolute: 8.3 10*3/uL — ABNORMAL HIGH (ref 1.4–7.0)
PLATELETS: 293 10*3/uL (ref 150–379)
RBC: 5.44 x10E6/uL (ref 4.14–5.80)
RDW: 15.9 % — AB (ref 12.3–15.4)
WBC: 9.3 10*3/uL (ref 3.4–10.8)

## 2015-10-02 LAB — LACTATE DEHYDROGENASE: LDH: 382 IU/L — AB (ref 121–224)

## 2015-10-21 ENCOUNTER — Other Ambulatory Visit: Payer: BLUE CROSS/BLUE SHIELD

## 2015-10-22 ENCOUNTER — Other Ambulatory Visit: Payer: BLUE CROSS/BLUE SHIELD

## 2015-10-28 ENCOUNTER — Telehealth: Payer: Self-pay | Admitting: Internal Medicine

## 2015-10-28 NOTE — Telephone Encounter (Signed)
NEEDS REFILL ON HYDROCODONE

## 2015-11-02 ENCOUNTER — Ambulatory Visit: Payer: BLUE CROSS/BLUE SHIELD | Admitting: Oncology

## 2015-11-03 ENCOUNTER — Other Ambulatory Visit: Payer: Self-pay | Admitting: Internal Medicine

## 2015-11-03 MED ORDER — HYDROCODONE-ACETAMINOPHEN 5-325 MG PO TABS: 1.0000 | ORAL_TABLET | Freq: Four times a day (QID) | ORAL | Status: DC | PRN

## 2015-11-03 NOTE — Telephone Encounter (Signed)
Last visit w/ dr Beryle Beams 05/2015 Never visit w/ dr patel Next scheduled visit 1/20 w/ dr patel Needs a uds  Last script 12/7

## 2015-11-03 NOTE — Telephone Encounter (Signed)
As this patient was seen by Dr. Beryle Beams on 06/21/15 and deemed necessary for their treatment as documented in the EHR, I will refill this prescription for Norco 5/325 x 30 tablets provided he reschedules his appointment with Dr. Beryle Beams that was cancelled yesterday [1/10] due to weather. Per review of the [place] DEA database, this medication was last refilled 10/01/15 and has not been refilled over the time interval. I will consider if we need to order UDS at next appointment only because this medication is not for long-term use.

## 2015-11-03 NOTE — Telephone Encounter (Signed)
Pt requesting hydrocodone to be filled.  °

## 2015-11-05 ENCOUNTER — Telehealth: Payer: Self-pay | Admitting: Internal Medicine

## 2015-11-12 ENCOUNTER — Encounter: Payer: BLUE CROSS/BLUE SHIELD | Admitting: Internal Medicine

## 2015-11-22 ENCOUNTER — Other Ambulatory Visit: Payer: Self-pay | Admitting: Internal Medicine

## 2015-11-22 MED ORDER — RUXOLITINIB PHOSPHATE 20 MG PO TABS: 20.0000 mg | ORAL_TABLET | Freq: Two times a day (BID) | ORAL | Status: DC

## 2015-11-22 NOTE — Telephone Encounter (Signed)
Rec'd Call from [facility] for patient's Jakafi refill.

## 2015-11-25 ENCOUNTER — Telehealth: Payer: Self-pay | Admitting: *Deleted

## 2015-11-25 ENCOUNTER — Telehealth: Payer: Self-pay | Admitting: Internal Medicine

## 2015-11-25 NOTE — Telephone Encounter (Signed)
Will someone let him know?

## 2015-11-25 NOTE — Telephone Encounter (Signed)
Call to patient to confirm appointment for 11/26/15 at 3:45 lmtcb

## 2015-11-25 NOTE — Telephone Encounter (Signed)
Message - Larene Beach, RN from [facility] stated Shanon Brow has been approved thru the patient assistance program for free thru 11/19/16.

## 2015-11-26 ENCOUNTER — Encounter: Payer: BLUE CROSS/BLUE SHIELD | Admitting: Internal Medicine

## 2015-11-29 ENCOUNTER — Other Ambulatory Visit: Payer: Self-pay | Admitting: Internal Medicine

## 2015-11-29 NOTE — Telephone Encounter (Signed)
Pt requesting hydrocodone to be filled.  °

## 2015-11-29 NOTE — Telephone Encounter (Signed)
Last filled 1/11 Last visit 05/2015 Cannot find a uds

## 2015-11-30 ENCOUNTER — Telehealth: Payer: Self-pay | Admitting: Internal Medicine

## 2015-11-30 MED ORDER — HYDROCODONE-ACETAMINOPHEN 5-325 MG PO TABS: 1.0000 | ORAL_TABLET | Freq: Four times a day (QID) | ORAL | Status: DC | PRN

## 2015-11-30 NOTE — Telephone Encounter (Signed)
APPT REMINDER CALL/LMTCB IF HE NEEDS TO CANCEL

## 2015-11-30 NOTE — Telephone Encounter (Signed)
I will refill this prescription and leave it in the box. Please provide to him tomorrow ONLY if he reschedules his follow-up with Dr. Beryle Beams, the physician who is providing care for the underlying condition for which he needs his pain medication.   He cancelled his appointment in January with him and also cancelled his appointment with me last Friday.

## 2015-12-01 ENCOUNTER — Ambulatory Visit (INDEPENDENT_AMBULATORY_CARE_PROVIDER_SITE_OTHER): Payer: BLUE CROSS/BLUE SHIELD | Admitting: Internal Medicine

## 2015-12-01 VITALS — BP 144/75 | HR 52 | Temp 98.2°F | Wt 215.2 lb

## 2015-12-01 DIAGNOSIS — M545 Low back pain, unspecified: Secondary | ICD-10-CM

## 2015-12-01 DIAGNOSIS — R030 Elevated blood-pressure reading, without diagnosis of hypertension: Secondary | ICD-10-CM

## 2015-12-01 DIAGNOSIS — D471 Chronic myeloproliferative disease: Secondary | ICD-10-CM

## 2015-12-01 DIAGNOSIS — IMO0001 Reserved for inherently not codable concepts without codable children: Secondary | ICD-10-CM

## 2015-12-01 NOTE — Patient Instructions (Signed)
1. Please make a follow up appointment for 1 month to readdress your blood pressure with Dr. Posey Walter.   2. Please take all medications as previously prescribed.  3. If you have worsening of your symptoms or new symptoms arise, please call the clinic [phone] or go to the ER immediately if symptoms are severe.  You have done a great job in taking all your medications. Please continue to do this.    Hypertension Hypertension, commonly called high blood pressure, is when the force of blood pumping through your arteries is too strong. Your arteries are the blood vessels that carry blood from your heart throughout your body. A blood pressure reading consists of a higher number over a lower number, such as 110/72. The higher number (systolic) is the pressure inside your arteries when your heart pumps. The lower number (diastolic) is the pressure inside your arteries when your heart relaxes. Ideally you want your blood pressure below 120/80. Hypertension forces your heart to work harder to pump blood. Your arteries may become narrow or stiff. Having untreated or uncontrolled hypertension can cause heart attack, stroke, kidney disease, and other problems. RISK FACTORS Some risk factors for high blood pressure are controllable. Others are not.  Risk factors you cannot control include:   Race. You may be at higher risk if you are African American.  Age. Risk increases with age.  Gender. Men are at higher risk than women before age 41 years. After age 22, women are at higher risk than men. Risk factors you can control include:  Not getting enough exercise or physical activity.  Being overweight.  Getting too much fat, sugar, calories, or salt in your diet.  Drinking too much alcohol. SIGNS AND SYMPTOMS Hypertension does not usually cause signs or symptoms. Extremely high blood pressure (hypertensive crisis) may cause headache, anxiety, shortness of breath, and nosebleed. DIAGNOSIS To check if  you have hypertension, your health care provider will measure your blood pressure while you are seated, with your arm held at the level of your heart. It should be measured at least twice using the same arm. Certain conditions can cause a difference in blood pressure between your right and left arms. A blood pressure reading that is higher than normal on one occasion does not mean that you need treatment. If it is not clear whether you have high blood pressure, you may be asked to return on a different day to have your blood pressure checked again. Or, you may be asked to monitor your blood pressure at home for 1 or more weeks. TREATMENT Treating high blood pressure includes making lifestyle changes and possibly taking medicine. Living a healthy lifestyle can help lower high blood pressure. You may need to change some of your habits. Lifestyle changes may include:  Following the DASH diet. This diet is high in fruits, vegetables, and whole grains. It is low in salt, red meat, and added sugars.  Keep your sodium intake below 2,300 mg per day.  Getting at least 30-45 minutes of aerobic exercise at least 4 times per week.  Losing weight if necessary.  Not smoking.  Limiting alcoholic beverages.  Learning ways to reduce stress. Your health care provider may prescribe medicine if lifestyle changes are not enough to get your blood pressure under control, and if one of the following is true:  You are 32-60 years of age and your systolic blood pressure is above 140.  You are 1 years of age or older, and your systolic blood  pressure is above 150.  Your diastolic blood pressure is above 90.  You have diabetes, and your systolic blood pressure is over [id number] or your diastolic blood pressure is over 90.  You have kidney disease and your blood pressure is above 140/90.  You have heart disease and your blood pressure is above 140/90. Your personal target blood pressure may vary depending on your  medical conditions, your age, and other factors. HOME CARE INSTRUCTIONS  Have your blood pressure rechecked as directed by your health care provider.   Take medicines only as directed by your health care provider. Follow the directions carefully. Blood pressure medicines must be taken as prescribed. The medicine does not work as well when you skip doses. Skipping doses also puts you at risk for problems.  Do not smoke.   Monitor your blood pressure at home as directed by your health care provider. SEEK MEDICAL CARE IF:   You think you are having a reaction to medicines taken.  You have recurrent headaches or feel dizzy.  You have swelling in your ankles.  You have trouble with your vision. SEEK IMMEDIATE MEDICAL CARE IF:  You develop a severe headache or confusion.  You have unusual weakness, numbness, or feel faint.  You have severe chest or abdominal pain.  You vomit repeatedly.  You have trouble breathing. MAKE SURE YOU:   Understand these instructions.  Will watch your condition.  Will get help right away if you are not doing well or get worse.   This information is not intended to replace advice given to you by your health care provider. Make sure you discuss any questions you have with your health care provider.   Document Released: 10/09/2005 Document Revised: 02/23/2015 Document Reviewed: 08/01/2013 Elsevier Interactive Patient Education [employer].

## 2015-12-01 NOTE — Assessment & Plan Note (Signed)
BP Readings from Last 3 Encounters:  12/01/15 144/75  06/21/15 128/78  03/11/15 163/100    Lab Results  Component Value Date   NA 142 10/01/2015   K 4.0 10/01/2015   CREATININE 1.45* 10/01/2015    Assessment: Blood pressure control:  Elevated.  Comments: Looking back, patient has had BP elevation on multiple occasions, including this visit. Feel that this is a reasonable time to start low dose HCTZ, however, patient says he "wants to think about it" and refuses labs and treatment at this time.   Plan: Medications:  See above.  Educational resources provided:  Discussed risks of uncontrolled HTN at length including CAD, CHF, CVA.  Other plans: RTC in 1 month to follow up with PCP.

## 2015-12-01 NOTE — Progress Notes (Signed)
Subjective:   Patient ID: Sean Walter male   DOB: 16-Nov-1970 45 y.o.   MRN: [id number]  HPI: Mr. SEANANTHONY REEL is a 45 y.o. male w/ PMHx of Asthma and Myelofibrosis, presents to the clinic today for a follow-up visit for pain management. Patient takes Vicodin 5-325 mg daily prn for pain involving left flank pain related to splenomegaly, as well as chronic back pain and left shoulder pain. Says his level of pain is the same as it has been in the past. No other complaints. Discussed elevated blood pressure on multiple office visits, suggested starting low dose antihypertensive, is not interested in starting this at this time.   Past Medical History  Diagnosis Date  . Asthma, cold induced   . Herpes   . Asthma   . No pertinent past medical history     enlarged spleen  . Spleen enlargement     myofibrosis need bonemarrow transplant  . Myelofibrosis     followed by Dr. Sherryl Manges  . 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 albuterol (PROVENTIL HFA;VENTOLIN HFA) 108 (90 BASE) MCG/ACT inhaler Inhale 2 puffs into the lungs every 6 (six) hours as needed for wheezing or shortness of breath.     . cephALEXin (KEFLEX) 500 MG capsule Take 1 capsule (500 mg total) by mouth 4 (four) times daily. 28 capsule 0  . diclofenac sodium (VOLTAREN) 1 % GEL Apply 2 g topically 4 (four) times daily. 100 g 0  . HYDROcodone-acetaminophen (NORCO/VICODIN) 5-325 MG tablet Take 1 tablet by mouth every 6 (six) hours as needed for moderate pain. 30 tablet 0  . imiquimod (ALDARA) 5 % cream   6  . meloxicam (MOBIC) 15 MG tablet Take 1 tablet (15 mg total) by mouth daily. 30 tablet 2  . predniSONE (STERAPRED UNI-PAK) 10 MG tablet 6 tabs po day 1, 5 tabs po day 2, 4 tabs po day 3, 3 tabs po day 4, 2 tabs po day 5, 1 tab po day 6 21 tablet 0  . ruxolitinib phosphate  (JAKAFI) 20 MG tablet Take 1 tablet (20 mg total) by mouth 2 (two) times daily. 60 tablet 11   No current facility-administered medications for this visit.    Review of Systems: General: Denies fever, chills, diaphoresis, appetite change and fatigue.  Respiratory: Denies SOB, DOE, cough, and wheezing.   Cardiovascular: Denies chest pain and palpitations.  Gastrointestinal: Denies nausea, vomiting, abdominal pain, and diarrhea.  Genitourinary: Denies dysuria, increased frequency, and flank pain. Endocrine: Denies hot or cold intolerance, polyuria, and polydipsia. Musculoskeletal: Positive for myalgias and back pain. Denies joint swelling, arthralgias and gait problem.  Skin: Denies pallor, rash and wounds.  Neurological: Denies dizziness, seizures, syncope, weakness, lightheadedness, numbness and headaches.  Psychiatric/Behavioral: Denies mood changes, and sleep disturbances.  Objective:   Physical Exam: Filed Vitals:   12/01/15 0903  BP: 144/75  Pulse: 52  Temp: 98.2 F (36.8 C)  TempSrc: Oral  Weight: 215 lb 3.2 oz (97.614 kg)  SpO2: 97%    General: AA male, alert, cooperative, NAD. HEENT: PERRL, EOMI. Moist mucus membranes Neck: Full range of motion without pain, supple, no lymphadenopathy or carotid bruits Lungs: Clear to ascultation bilaterally, normal work of respiration, no wheezes, rales, rhonchi Heart: RRR, no murmurs, gallops, or rubs Abdomen: Soft, non-tender, non-distended, BS +. Spleen tip palpated.  Extremities: No cyanosis, clubbing, or  edema Neurologic: Alert & oriented X3, cranial nerves II-XII intact, strength grossly intact, sensation intact to light touch   Assessment & Plan:   Please see problem based assessment and plan.

## 2015-12-02 NOTE — Assessment & Plan Note (Signed)
Patient with low back pain, left shoulder pain, and left flank pain for which he takes Vicodin daily.  -UDS checked. Patient states he took Vicodin as recently as yesterday (11/30/15) -Given Rx for Vicodin 5-325 mg qd prn #30 previously signed by PCP, Dr. Posey Pronto

## 2015-12-02 NOTE — Progress Notes (Signed)
Internal Medicine Clinic Attending  Case discussed with Dr. Jones at the time of the visit.  We reviewed the resident's history and exam and pertinent patient test results.  I agree with the assessment, diagnosis, and plan of care documented in the resident's note.  

## 2015-12-02 NOTE — Assessment & Plan Note (Signed)
Follows with Dr. Beryle Beams, takes [facility].

## 2015-12-07 LAB — TOXASSURE SELECT,+ANTIDEPR,UR: PDF: 0

## 2015-12-28 ENCOUNTER — Other Ambulatory Visit: Payer: Self-pay | Admitting: Internal Medicine

## 2015-12-28 NOTE — Telephone Encounter (Signed)
Last filled 2/8 Last visit 2/8 Please review uds from 2/8 Dr patel i just checked his folder: picked up script 1/13 then saw dr Ronnald Ramp 2/8 and got new script, uds done 2/8 abnormal, you have a script in his folder that you did 2/7 please call and advise [phone] Thanks! hla i forgot dr patel was away on rotation, sending to dr Software engineer

## 2015-12-28 NOTE — Telephone Encounter (Signed)
I am more concerned about the oxycodone in his urine and the absence of hydrocodone in his urine than the ETOH and THC. Needs face to face. Can be with resident or I can explain tomorrow at 1:30.

## 2015-12-28 NOTE — Telephone Encounter (Signed)
Pt requesting hydrocodone to be filled.  °

## 2015-12-28 NOTE — Telephone Encounter (Signed)
Called pt to schedule appt, states he cannot come in except early am's due to work, ask me to call him back tomorrow am to schedule appt, have made appt w/ dr Melburn Hake for thurs 3/9 at 0815 will call pt in am and confirm

## 2015-12-29 NOTE — Telephone Encounter (Signed)
Called pt twice today, lm just now for appt 3/9 at [facility]

## 2015-12-30 ENCOUNTER — Ambulatory Visit: Payer: BLUE CROSS/BLUE SHIELD | Admitting: Internal Medicine

## 2015-12-30 NOTE — Telephone Encounter (Signed)
Will await his call

## 2015-12-30 NOTE — Telephone Encounter (Signed)
So you know: he didn't come to his appointment today.

## 2015-12-31 ENCOUNTER — Encounter: Payer: Self-pay | Admitting: Internal Medicine

## 2015-12-31 ENCOUNTER — Ambulatory Visit (INDEPENDENT_AMBULATORY_CARE_PROVIDER_SITE_OTHER): Payer: BLUE CROSS/BLUE SHIELD | Admitting: Internal Medicine

## 2015-12-31 DIAGNOSIS — G8929 Other chronic pain: Secondary | ICD-10-CM | POA: Diagnosis not present

## 2015-12-31 DIAGNOSIS — F1120 Opioid dependence, uncomplicated: Secondary | ICD-10-CM | POA: Diagnosis not present

## 2015-12-31 DIAGNOSIS — M545 Low back pain: Secondary | ICD-10-CM

## 2015-12-31 NOTE — Progress Notes (Signed)
Patient ID: Sean Walter, male   DOB: 08/20/1971, 45 y.o.   MRN: [id number]   Subjective:   Patient ID: Sean Walter male   DOB: 06-30-1971 45 y.o.   MRN: [id number]  HPI: SeanKleber R Walter is a 45 y.o. with PMH listed below, presented today to request refill for pain meds- Norco ( Hydrocodone-acetaminophen- 5-325mg ). Please see problem based charting for details.  Past Medical History  Diagnosis Date  . Asthma, cold induced   . Herpes   . Asthma   . No pertinent past medical history     enlarged spleen  . Spleen enlargement     myofibrosis need bonemarrow transplant  . Myelofibrosis     followed by Dr. Sherryl Manges  . 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 albuterol (PROVENTIL HFA;VENTOLIN HFA) 108 (90 BASE) MCG/ACT inhaler Inhale 2 puffs into the lungs every 6 (six) hours as needed for wheezing or shortness of breath.     . diclofenac sodium (VOLTAREN) 1 % GEL Apply 2 g topically 4 (four) times daily. 100 g 0  . HYDROcodone-acetaminophen (NORCO/VICODIN) 5-325 MG tablet Take 1 tablet by mouth every 6 (six) hours as needed for moderate pain. 30 tablet 0  . imiquimod (ALDARA) 5 % cream   6  . meloxicam (MOBIC) 15 MG tablet Take 1 tablet (15 mg total) by mouth daily. 30 tablet 2  . ruxolitinib phosphate (JAKAFI) 20 MG tablet Take 1 tablet (20 mg total) by mouth 2 (two) times daily. 60 tablet 11   No current facility-administered medications for this visit.   Review of Systems: CONSTITUTIONAL- No Fever, or change in appetite. SKIN- No Rash, colour changes or itching. RESPIRATORY- No Cough or SOB. CARDIAC- No chest pain. GI- No vomiting, diarrhoea, abd pain. URINARY- No Frequency,or dysuria.  Objective:  Physical Exam: Filed Vitals:   12/31/15 1410  BP: 133/76  Pulse: 83  Temp: 98.5 F (36.9 C)  TempSrc: Oral  SpO2:  97%   Full exam not done, as pt was upset and left.  GENERAL- alert, appears as stated age. HEENT- Atraumatic, normocephalic CARDIAC- Not done RESP- Not done Abd- Not done NEURO- Alert , Gait- Normal. EXTREMITIES- Not done SKIN- Not done [facility] [place]- Normal mood and affect, appropriate thought content and speech.  Assessment & Plan:  The patient's case and plan of care was discussed with attending physician, Dr. Evette Doffing.  Please see problem based charting for assessment and plan.

## 2016-01-03 NOTE — Assessment & Plan Note (Deleted)
Pt with hx of primary myelofibrosis. The purpose of todays visit is to request refills. Pt says he is not sure whty he was told to come in today, when he usually just gets his refills, without having to have a full visit.  Last visit UDS was ordered-

## 2016-01-03 NOTE — Assessment & Plan Note (Addendum)
Pt with hx of primary myelofibrosis. The purpose of todays visit is to request refills for his Vicodin. Pt says he is not sure why he was told to come in today, when he usually just gets his refills, without having to have a full visit. Pt is on hydrocodone-acetaminophen- 5-325mg  ,Q6H, #30pills monthly.  Last visit UDS was ordered- 12/01/2015. On that visit, notes says he took his vicodin the day before. Results - Oxycodone was present, hydrocodone which he is supposed to be taking was absent, Alcohol and THC were also present. On presenting results to patient, he says he got 2 pills sometime last month given to him by a friend at work, he says he cannot remember when he was given. He had run out of meds.  When I ask him where his pain is, he keeps saying he has an enlarged spleen. He did not mention neck pain or back pain. I offered to get another UDS done today, but he says he had not taken the med in ~2 weeks, because he ran out. The results of this test were not discussed with patient, and he had no idea why the test had been done.   Of note, pt has no pain contract, so he is unaware of the rules we have when getting opioid prescription. He says he was not told.  Patient was very upset when I told him I cannot give him a refill at this time, he will have to come back and discuss this with his PCP, and get a contract with the rules spelt out and emphasized. He said i was treating him this way because of "the way he looks", he said I had wasted his time, and what will he use 30pills of vicodin to do? when he is in pain, and that he has a job and does not this for income to feed his family. He walked out while we were still discussing what the plan will be.   Plan- Pt was not given vicodin prescription today, as he had not taken his meds in 2 weeks, so he will not withdraw. - Last note mentioned pt to see PCP, will route message to PCP to address this issue.  As pt was not warned about the rules, and so not  sure if a UDS was appropriate. Also he gets 30pills and if he does really have persistent pain, 30pills might not be enough.

## 2016-01-04 ENCOUNTER — Encounter: Payer: Self-pay | Admitting: Oncology

## 2016-01-04 ENCOUNTER — Encounter: Payer: BLUE CROSS/BLUE SHIELD | Admitting: Oncology

## 2016-01-04 NOTE — Progress Notes (Signed)
Internal Medicine Clinic Attending  Case discussed with Dr. Denton Brick at the time of the visit.  We reviewed the resident's history and exam and pertinent patient test results.  I agree with the assessment, diagnosis, and plan of care documented in the resident's note.  This was a difficult case of a young man with opiate dependency due in part to chronic pain generated by splenomegaly from myelofibrosis for several years. In 2015 he was receiving monthly Percocet #75, then weaned down to Vicodin #40, and then Vicodin #30 for the last four months. He presents today looking for a refill, but on chart review we see a very abnormal urine tox screen from his February visit. Despite telling use he used Vicodin the day prior to the February drug test, the urine was negative for Vicodin. It was positive for Oxycodone and its metabolites, as well as THC. This appeared to be potential vicodin diversion, opiate misuse given combination with illicit drugs and unclear source of oxycodone. Our goal was to find out if this is just an old prescription of oxycodone, or is he obtaining oxycodone illicitly. Dr. Denton Brick showed the patient the tox screen and asked politely for the story behind it, but the patient became very angry, fairly inappropriate, and walked out of his visit. This was our first time meeting this patient, so I think he should follow up with physicians he knows better next visit, like Dr. Posey Pronto, to talk about his risk for opiate misuse or addiction.

## 2016-01-20 ENCOUNTER — Telehealth: Payer: Self-pay | Admitting: *Deleted

## 2016-01-20 NOTE — Telephone Encounter (Signed)
Pt called - lab appt schedule for tomorrow, Friday @ 1100AM.  Talked to [place] about re-scheduling his appt w/Dr Beryle Beams. Stated she hve to talk to Dr Darnell Level; no available appts until late April/May.

## 2016-01-21 ENCOUNTER — Other Ambulatory Visit (INDEPENDENT_AMBULATORY_CARE_PROVIDER_SITE_OTHER): Payer: BLUE CROSS/BLUE SHIELD

## 2016-01-21 DIAGNOSIS — D47Z9 Other specified neoplasms of uncertain behavior of lymphoid, hematopoietic and related tissue: Secondary | ICD-10-CM

## 2016-01-21 DIAGNOSIS — D471 Chronic myeloproliferative disease: Secondary | ICD-10-CM | POA: Diagnosis not present

## 2016-01-21 DIAGNOSIS — C946 Myelodysplastic disease, not classified: Secondary | ICD-10-CM

## 2016-01-22 LAB — CBC WITH DIFFERENTIAL/PLATELET
BASOS: 0 %
Basophils Absolute: 0 10*3/uL (ref 0.0–0.2)
EOS (ABSOLUTE): 0.1 10*3/uL (ref 0.0–0.4)
Eos: 1 %
Hematocrit: 47.2 % (ref 37.5–51.0)
Hemoglobin: 15.7 g/dL (ref 12.6–17.7)
IMMATURE GRANS (ABS): 0 10*3/uL (ref 0.0–0.1)
Immature Granulocytes: 0 %
LYMPHS ABS: 0.6 10*3/uL — AB (ref 0.7–3.1)
LYMPHS: 8 %
MCH: 25 pg — ABNORMAL LOW (ref 26.6–33.0)
MCHC: 33.3 g/dL (ref 31.5–35.7)
MCV: 75 fL — AB (ref 79–97)
Monocytes Absolute: 0.2 10*3/uL (ref 0.1–0.9)
Monocytes: 2 %
NEUTROS ABS: 7.4 10*3/uL — AB (ref 1.4–7.0)
Neutrophils: 89 %
PLATELETS: 287 10*3/uL (ref 150–379)
RBC: 6.28 x10E6/uL — ABNORMAL HIGH (ref 4.14–5.80)
RDW: 17.7 % — ABNORMAL HIGH (ref 12.3–15.4)
WBC: 8.4 10*3/uL (ref 3.4–10.8)

## 2016-01-22 LAB — COMPREHENSIVE METABOLIC PANEL
A/G RATIO: 1.6 (ref 1.2–2.2)
ALBUMIN: 4.6 g/dL (ref 3.5–5.5)
ALT: 39 IU/L (ref 0–44)
AST: 26 IU/L (ref 0–40)
Alkaline Phosphatase: 73 IU/L (ref 39–117)
BILIRUBIN TOTAL: 0.5 mg/dL (ref 0.0–1.2)
BUN / CREAT RATIO: 7 — AB (ref 9–20)
BUN: 9 mg/dL (ref 6–24)
CHLORIDE: 97 mmol/L (ref 96–106)
CO2: 22 mmol/L (ref 18–29)
Calcium: 9.1 mg/dL (ref 8.7–10.2)
Creatinine, Ser: 1.26 mg/dL (ref 0.76–1.27)
GFR calc non Af Amer: 69 mL/min/{1.73_m2} (ref >59)
GFR, EST AFRICAN AMERICAN: 80 mL/min/{1.73_m2} (ref >59)
Globulin, Total: 2.8 g/dL (ref 1.5–4.5)
Glucose: 100 mg/dL — ABNORMAL HIGH (ref 65–99)
POTASSIUM: 4.5 mmol/L (ref 3.5–5.2)
Sodium: 138 mmol/L (ref 134–144)
TOTAL PROTEIN: 7.4 g/dL (ref 6.0–8.5)

## 2016-01-22 LAB — LACTATE DEHYDROGENASE: LDH: 379 IU/L — ABNORMAL HIGH (ref 121–224)

## 2016-01-26 ENCOUNTER — Telehealth: Payer: Self-pay | Admitting: *Deleted

## 2016-01-26 NOTE — Telephone Encounter (Signed)
-----   Message from [name], MD sent at 01/22/2016 10:04 AM EDT ----- Call pt: lab all OK; stay on current dose of JAKOFI

## 2016-01-26 NOTE — Telephone Encounter (Signed)
Pt called - no answer; left message labs are ok and to stay on current dose of Jakafi per Dr Beryle Beams. And to call if he has any questions.

## 2016-02-07 ENCOUNTER — Encounter: Payer: Self-pay | Admitting: Oncology

## 2016-02-07 ENCOUNTER — Ambulatory Visit (INDEPENDENT_AMBULATORY_CARE_PROVIDER_SITE_OTHER): Payer: BLUE CROSS/BLUE SHIELD | Admitting: Oncology

## 2016-02-07 VITALS — BP 134/68 | HR 74 | Temp 98.5°F | Ht 72.0 in | Wt 212.3 lb

## 2016-02-07 DIAGNOSIS — D471 Chronic myeloproliferative disease: Secondary | ICD-10-CM | POA: Diagnosis not present

## 2016-02-07 DIAGNOSIS — L308 Other specified dermatitis: Secondary | ICD-10-CM | POA: Diagnosis not present

## 2016-02-07 DIAGNOSIS — R161 Splenomegaly, not elsewhere classified: Secondary | ICD-10-CM | POA: Diagnosis not present

## 2016-02-07 DIAGNOSIS — Z79891 Long term (current) use of opiate analgesic: Secondary | ICD-10-CM

## 2016-02-07 MED ORDER — OXYCODONE-ACETAMINOPHEN 10-650 MG PO TABS: 1.0000 | ORAL_TABLET | Freq: Four times a day (QID) | ORAL | Status: DC | PRN

## 2016-02-07 MED ORDER — ESOMEPRAZOLE MAGNESIUM 40 MG PO CPDR: 40.0000 mg | DELAYED_RELEASE_CAPSULE | Freq: Every day | ORAL | Status: DC

## 2016-02-07 NOTE — Progress Notes (Signed)
Patient ID: Sean Walter, male   DOB: 1971/01/10, 45 y.o.   MRN: [id number] Hematology and Oncology Follow Up Visit  Sean Walter [id number] 01-Jul-1971 45 y.o. 02/07/2016 2:44 PM   Principle Diagnosis: Encounter Diagnoses  Name Primary?  . Spleen enlargement Yes  . Primary myelofibrosis ([place])   Clinical Summary: 45 year old man who was initially called myelofibrosis but who more likely has a non-BCR-ABL myeloproliferative disorder. He presented with leukocytosis and massive splenomegaly in January 2012. Bone marrow biopsy done in 11/16/10 was hypercellular with prominent proliferation of megakaryocytes many of which had abnormal morphology. Collagen and reticulin stains were focally positive. There were no excess blasts; in fact, on a 500 cell count differential, 0% blasts were recorded. Routine cytogenetics were normal but FISH probes were not done. Peripheral blood was negative for the presence of BCR-ABL transcripts. He is [name]. (03/07/13)  The patient was started on JAKOFI in February, 2012. Current dose is 20 mg twice daily. He has had doses as high as 25 mg twice daily.  I have followed him since July of 2013. Blood counts initially stable and there was a significant decrease in his spleen size although he continued to complain of pain and requested Percocet on a regular basis.  He developed cumulative myelotoxicity requiring a dose reduction from 25 mg twice daily down to 20 mg twice daily in late August 2015. There was a discrepancy in my notes where I said I was decreasing him to 50 mg twice daily in August 2015 and then brought a prescription for 20 mg twice daily. He recently came to my attention that the patient was actually only taking 10 mg twice daily. It is no surprise that his blood counts completely recovered but also that his spleen started to enlarge again at time of most recent ultrasound done on 01/08/2015 with spleen dimensions 17 x 6 x 17 cm and  volume 1189 mL cubed. I wrote a new prescription for 20 mg twice daily which he started taking on 01/31/2015. Blood counts stabilize. Ultrasound of the spleen done on 07/08/2015 showed spleen size mildly decreased compared with the March study now measuring 16 x 5 x 17 cm.    Interim History:  Since his last visit with me on 06/21/2015, he has seen one of our medicine residents Dr. Charlott Rakes for his primary care. He had a cross appointment on 12/31/2015 when he was actually supposed to see me. He saw Dr. Denton Brick since Dr. Posey Pronto was away on elective. I have never talked to the patient about a pain contract. He was caught off guard and had a negative visit with Dr. Denton Brick. He left without getting a prescription renewal for his chronic pain meds. He still complains of pain in the left flank and left upper quadrant which is primarily occurring during heavy physical labor on his job at the [employer]. He can usually tolerate the pain for about 4 hours if he takes his pain killers. He ran out of his medicine. His brother gave him a Percocet 10/650 and he got better pain relief with this than the Vicodin 5/325. The previous verrucous rash on his scalp has nearly completely resolved with topical therapy. He has had a new area of a "punched out" lesion on the skin between his eyes. For the last few weeks he is experiencing a burning sensation in the back of his throat/upper chest which is worse at night in the supine position. His wife just lost  her job and he is struggling to make ends meet.   Medications: reviewed  Allergies:  Allergies  Allergen Reactions  . Ibuprofen Other (See Comments)    Indigestion    Review of Systems: See interim history. Remaining ROS negative:   Physical Exam: Blood pressure 134/68, pulse 74, temperature 98.5 F (36.9 C), temperature source Oral, height 6' (1.829 m), weight 212 lb 4.8 oz (96.299 kg), SpO2 100 %. Wt Readings from Last 3 Encounters:   02/07/16 212 lb 4.8 oz (96.299 kg)  12/01/15 215 lb 3.2 oz (97.614 kg)  06/21/15 219 lb 4.8 oz (99.474 kg)     General appearance: Muscular African-American man HENNT: Pharynx no erythema, exudate, mass, or ulcer. No thyromegaly or thyroid nodules Lymph nodes: No cervical, supraclavicular, or axillary lymphadenopathy Breasts Lungs: Clear to auscultation, resonant to percussion throughout Heart: Regular rhythm, no murmur, no gallop, no rub, no click, no edema Abdomen: Soft, nontender, normal bowel sounds, no mass, spleen palpable approximately 4 cm below left costal margin. Nontender. Extremities: No edema, no calf tenderness Musculoskeletal: no joint deformities GU:  Vascular: Carotid pulses 2+, no bruits,  Neurologic: Alert, oriented, PERRLA,   cranial nerves grossly normal, motor strength 5 over 5, reflexes 1+ symmetric, upper body coordination normal, gait normal, Skin: Near-complete resolution of previous verrucous rash on the scalp. New area of dermatitis in a localized area between his eyes. Atypical. " Punched out" 5 mm circular area with adjacent nodular area   Lab Results: CBC W/Diff    Component Value Date/Time   WBC 8.4 01/21/2016 1053   WBC 7.6 04/30/2015 1335   WBC 7.1 01/05/2014 1035   RBC 6.28* 01/21/2016 1053   RBC 5.08 04/30/2015 1335   RBC 4.69 01/05/2014 1035   HGB 13.5 04/30/2015 1335   HGB 12.0* 01/05/2014 1035   HCT 47.2 01/21/2016 1053   HCT 41.3 04/30/2015 1335   HCT 37.1* 01/05/2014 1035   PLT 287 01/21/2016 1053   PLT 256 04/30/2015 1335   PLT 134* 01/05/2014 1035   MCV 75* 01/21/2016 1053   MCV 81.3 04/30/2015 1335   MCV 79.1* 01/05/2014 1035   MCH 25.0* 01/21/2016 1053   MCH 26.6 04/30/2015 1335   MCH 25.6* 01/05/2014 1035   MCHC 33.3 01/21/2016 1053   MCHC 32.7 04/30/2015 1335   MCHC 32.3 01/05/2014 1035   RDW 17.7* 01/21/2016 1053   RDW 17.0* 04/30/2015 1335   RDW 16.8* 01/05/2014 1035   LYMPHSABS 0.6* 01/21/2016 1053   LYMPHSABS 0.5*  04/30/2015 1335   LYMPHSABS 0.7* 01/05/2014 1035   MONOABS 0.2 04/30/2015 1335   MONOABS 0.3 01/05/2014 1035   EOSABS 0.1 01/21/2016 1053   EOSABS 0.1 04/30/2015 1335   EOSABS 0.1 01/05/2014 1035   BASOSABS 0.0 01/21/2016 1053   BASOSABS 0.1 04/30/2015 1335   BASOSABS 0.1 01/05/2014 1035     Chemistry      Component Value Date/Time   NA 138 01/21/2016 1053   NA 139 04/30/2015 1335   NA 141 01/05/2014 1035   K 4.5 01/21/2016 1053   K 3.9 01/05/2014 1035   CL 97 01/21/2016 1053   CL 106 03/07/2013 0826   CO2 22 01/21/2016 1053   CO2 24 01/05/2014 1035   BUN 9 01/21/2016 1053   BUN 7 04/30/2015 1335   BUN 11.3 01/05/2014 1035   CREATININE 1.26 01/21/2016 1053   CREATININE 1.21 04/30/2015 1335   CREATININE 1.8* 01/05/2014 1035      Component Value Date/Time   CALCIUM  9.1 01/21/2016 1053   CALCIUM 9.5 01/05/2014 1035   ALKPHOS 73 01/21/2016 1053   ALKPHOS 57 01/05/2014 1035   AST 26 01/21/2016 1053   AST 21 01/05/2014 1035   ALT 39 01/21/2016 1053   ALT 31 01/05/2014 1035   BILITOT 0.5 01/21/2016 1053   BILITOT 0.5 04/30/2015 1335   BILITOT 0.49 01/05/2014 1035       Radiological Studies: No results found.  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 Blood counts currently excellent as of 01/21/2016: Hemoglobin 15.7, white count 8400 with 89% neutrophils, platelet count 287,000. Initial response to [place] but regrowth of the spleen following dose reduction for myelotoxicity in August 2015. Due to a miscommunication, he decreased the dose to 10 mg twice daily instead of 20 mg twice daily. 20 mg twice daily reinstituted on 01/31/2015. [name] Kitchen Spleen size by ultrasound 07/22/15 stable to slightly decreased. I will get a follow-up study at this time. Ongoing evaluation at [facility] with respect to allogeneic transplant. He has put this on the back burner due to personal and family issues.  Fortunately, blood counts are so good that we are not in an emergency situation. Better than expected response to the [place].  #2. Transient elevation of creatinine up to 1.8 now back to his baseline of 1.26 and likely normal for his muscle mass.  #3. 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 We reviewed his ongoing need for pain medication. I read word for word our pain contract. He agreed and understood to it and signed it. I cosigned it and gave him a copy. Since he had a better result with oxycodone with Tylenol I am going to change him to the oxycodone 10 mg with acetaminophen 650 mg 1 every 6 hours as needed for pain. 50 tablets per month. He will try to minimize how many of these that he takes. I told him that even though he can take up to one every 6 hours he does not need to take 1 every 6 hours. He asked me to write him a letter for his work. He can tolerate 4 hours of heavy work but then asked to work light duty for the remainder of the day. He believes that his employer will work with him in this regard.  #4. Molluscum contagiosum versus simple verrucae skin of neck and scalp with near complete response to topical antiviral cream.  #5. New area of atypical dermatitis on the face. I will defer management of this to his dermatologist.  CC: Patient Care Team: Riccardo Dubin, MD as PCP - General (Internal Medicine)   [name], MD 4/17/20172:44 PM

## 2016-02-07 NOTE — Progress Notes (Signed)
Copy of Pain Contract given to pt. 

## 2016-02-07 NOTE — Patient Instructions (Signed)
Continue monthly CBC,diff, CMet, LDH Schedule ultrasound spleen at [facility] 1st available Return visit with Dr Darnell Level in 4 months

## 2016-02-11 ENCOUNTER — Ambulatory Visit (HOSPITAL_COMMUNITY): Payer: BLUE CROSS/BLUE SHIELD

## 2016-02-18 ENCOUNTER — Ambulatory Visit (HOSPITAL_COMMUNITY)
Admission: RE | Admit: 2016-02-18 | Discharge: 2016-02-18 | Disposition: A | Discharge status: Home / Self Care | Payer: BLUE CROSS/BLUE SHIELD | Source: Ambulatory Visit | Attending: Oncology | Admitting: Oncology

## 2016-02-18 DIAGNOSIS — R161 Splenomegaly, not elsewhere classified: Secondary | ICD-10-CM

## 2016-02-18 DIAGNOSIS — D471 Chronic myeloproliferative disease: Secondary | ICD-10-CM | POA: Diagnosis present

## 2016-02-22 ENCOUNTER — Other Ambulatory Visit: Payer: Self-pay | Admitting: Oncology

## 2016-02-22 ENCOUNTER — Telehealth: Payer: Self-pay | Admitting: *Deleted

## 2016-02-22 NOTE — Telephone Encounter (Signed)
Pt called - no answer; left message to return my call.

## 2016-02-22 NOTE — Telephone Encounter (Signed)
-----   Message from [name], MD sent at 02/22/2016 10:48 AM EDT ----- Call pt: spleen size on ultrasound about the same as last time.  Check with him: is he taking the JAKAFI 20 mg twice a day?

## 2016-02-23 NOTE — Telephone Encounter (Signed)
Pt called - no answer; left message Abd U/S result "spleen size about the same as last time"per Dr Beryle Beams and need to know if he's taking the Jakafi 20 mg twice a day? Give me a call back.

## 2016-02-24 NOTE — Telephone Encounter (Signed)
Stated he has been taking JAKAFI 20 mg once a day b/c when it was increase to 20 mg , he thought it was only once a day (was taking 10 mg twice a day). Instructed to start taking 20 mg twice a day; voiced understanding and stated he will start today.

## 2016-03-02 ENCOUNTER — Telehealth: Payer: Self-pay | Admitting: Internal Medicine

## 2016-03-02 ENCOUNTER — Other Ambulatory Visit: Payer: Self-pay | Admitting: *Deleted

## 2016-03-02 DIAGNOSIS — D471 Chronic myeloproliferative disease: Secondary | ICD-10-CM

## 2016-03-02 DIAGNOSIS — R161 Splenomegaly, not elsewhere classified: Secondary | ICD-10-CM

## 2016-03-02 MED ORDER — OXYCODONE-ACETAMINOPHEN 10-650 MG PO TABS: 1.0000 | ORAL_TABLET | Freq: Four times a day (QID) | ORAL | Status: DC | PRN

## 2016-03-02 NOTE — Telephone Encounter (Signed)
In reviewing the last office visit with Dr. Beryle Beams dated 02/07/16, I have refilled oxycodone/acetaminophen 10/650 mg to be taken 1 tablet every 6 hours as needed 50 tablets with no refill prior to 30 days from last refill.

## 2016-03-02 NOTE — Telephone Encounter (Signed)
Last visit: 02/07/2016 Last UDS: 12/01/2015 Last written: 02/07/2016 Next appointment: none, recommended to return around 06/05/2016

## 2016-03-02 NOTE — Telephone Encounter (Signed)
Pain med refill °

## 2016-03-02 NOTE — Telephone Encounter (Signed)
done

## 2016-03-07 NOTE — Telephone Encounter (Signed)
One way around this is to break it down to Percocet 5/325 x 2 tablets every 6 hours as needed.   I will copy Dr. Beryle Beams to make sure we are on the same page before I change on his list.

## 2016-03-07 NOTE — Telephone Encounter (Signed)
Dr patel, the oxy should be 10/325 not 10/650, the 650 is not made anymore, i have given a verbal correction over the phone to pharm but can you change the med list to reflect this, thanks

## 2016-03-08 ENCOUNTER — Other Ambulatory Visit: Payer: Self-pay | Admitting: Oncology

## 2016-03-08 DIAGNOSIS — D471 Chronic myeloproliferative disease: Secondary | ICD-10-CM

## 2016-03-08 DIAGNOSIS — R161 Splenomegaly, not elsewhere classified: Secondary | ICD-10-CM

## 2016-03-08 MED ORDER — OXYCODONE-ACETAMINOPHEN 10-325 MG PO TABS: 1.0000 | ORAL_TABLET | Freq: Four times a day (QID) | ORAL | Status: DC | PRN

## 2016-03-17 ENCOUNTER — Ambulatory Visit (INDEPENDENT_AMBULATORY_CARE_PROVIDER_SITE_OTHER): Payer: BLUE CROSS/BLUE SHIELD | Admitting: Internal Medicine

## 2016-03-17 ENCOUNTER — Encounter: Payer: Self-pay | Admitting: Internal Medicine

## 2016-03-17 VITALS — BP 136/71 | HR 58 | Temp 98.0°F | Ht 72.0 in | Wt 219.6 lb

## 2016-03-17 DIAGNOSIS — B009 Herpesviral infection, unspecified: Secondary | ICD-10-CM

## 2016-03-17 DIAGNOSIS — B001 Herpesviral vesicular dermatitis: Secondary | ICD-10-CM | POA: Diagnosis not present

## 2016-03-17 MED ORDER — VALACYCLOVIR HCL 1 G PO TABS: 2000.0000 mg | ORAL_TABLET | Freq: Two times a day (BID) | ORAL | Status: DC

## 2016-03-17 NOTE — Assessment & Plan Note (Addendum)
Assessment For the last week, is noted the acute onset of painful lesions under his upper lip which he feels is consistent with herpes infections from the past. His most recent infection was one year ago. Aside from a headache which he noted a few hours ago, he denies any other neurologic symptoms, like weakness, confusion, or changes in vision, along with constitutional symptoms, like fever/chills. He also denies the presence of similar lesions in other parts of his body, including the genitalia.  Plan -Prescribed valacyclovir 1000 mg tablets to be taken 2 tablets every 12 hours. I also prescribed an additional 8 tablets to be taken for 2 episodes should they reoccur again.

## 2016-03-17 NOTE — Patient Instructions (Addendum)
Please take 2 tablets every 12 hours for a total of 4 tablets.  I have written for extras. Take the same way the next time you see these lesions.

## 2016-03-17 NOTE — Progress Notes (Addendum)
   Subjective:    Patient ID: Sean Walter, male    DOB: 22-Feb-1971, 45 y.o.   MRN: [id number]  HPI Sean Walter is a 45 year old male who presents today for oral lesions. Please see assessment & plan for status of chronic medical problems.    Review of Systems     Objective:   Physical Exam  Constitutional: He is oriented to person, place, and time. He appears well-developed and well-nourished.  HENT:  Head: Normocephalic and atraumatic.  Vesicular lesions noted underneath the upper lip measuring roughly 1-2 cm. Tender on palpation. No other lesions noted in the rest of the oropharynx.  Eyes: Conjunctivae are normal. No scleral icterus.  Neck: Normal range of motion. No tracheal deviation present.  Cardiovascular: Normal rate and regular rhythm.   Pulmonary/Chest: Effort normal. No respiratory distress.  Neurological: He is alert and oriented to person, place, and time.  Skin: Skin is warm and dry.          Assessment & Plan:

## 2016-03-21 NOTE — Progress Notes (Signed)
Internal Medicine Clinic Attending  Case discussed with Dr. Patel,Rushil at the time of the visit.  We reviewed the resident's history and exam and pertinent patient test results.  I agree with the assessment, diagnosis, and plan of care documented in the resident's note.  

## 2016-03-23 ENCOUNTER — Emergency Department (HOSPITAL_COMMUNITY)
Admission: EM | Admit: 2016-03-23 | Discharge: 2016-03-23 | Disposition: A | Discharge status: Home / Self Care | Payer: BLUE CROSS/BLUE SHIELD | Attending: Emergency Medicine | Admitting: Emergency Medicine

## 2016-03-23 ENCOUNTER — Encounter (HOSPITAL_COMMUNITY): Payer: Self-pay | Admitting: *Deleted

## 2016-03-23 DIAGNOSIS — Z8719 Personal history of other diseases of the digestive system: Secondary | ICD-10-CM | POA: Insufficient documentation

## 2016-03-23 DIAGNOSIS — Z86018 Personal history of other benign neoplasm: Secondary | ICD-10-CM | POA: Diagnosis not present

## 2016-03-23 DIAGNOSIS — F17210 Nicotine dependence, cigarettes, uncomplicated: Secondary | ICD-10-CM | POA: Insufficient documentation

## 2016-03-23 DIAGNOSIS — H11421 Conjunctival edema, right eye: Secondary | ICD-10-CM | POA: Insufficient documentation

## 2016-03-23 DIAGNOSIS — H578 Other specified disorders of eye and adnexa: Secondary | ICD-10-CM | POA: Diagnosis present

## 2016-03-23 DIAGNOSIS — Z79899 Other long term (current) drug therapy: Secondary | ICD-10-CM | POA: Diagnosis not present

## 2016-03-23 DIAGNOSIS — J45909 Unspecified asthma, uncomplicated: Secondary | ICD-10-CM | POA: Diagnosis not present

## 2016-03-23 DIAGNOSIS — Z791 Long term (current) use of non-steroidal anti-inflammatories (NSAID): Secondary | ICD-10-CM | POA: Insufficient documentation

## 2016-03-23 DIAGNOSIS — Z8619 Personal history of other infectious and parasitic diseases: Secondary | ICD-10-CM | POA: Diagnosis not present

## 2016-03-23 MED ORDER — DEXAMETHASONE 4 MG PO TABS
12.0000 mg | ORAL_TABLET | Freq: Once | ORAL | Status: AC
  Administered 2016-03-23: 12 mg via ORAL
  Filled 2016-03-23: qty 3

## 2016-03-23 MED ORDER — NAPHAZOLINE-PHENIRAMINE 0.025-0.3 % OP SOLN: 1.0000 [drp] | OPHTHALMIC | Status: DC | PRN

## 2016-03-23 NOTE — Discharge Instructions (Signed)
Either eye redness and swelling is due to an allergy. Please put the antihistamine drops in ear I every 4 hours as needed. Is okay to take Benadryl or Zyrtec or Claritin as needed if you're having some itching. If your having any problems, follow-up with the ophthalmologist.  Naphazoline; Pheniramine eye solution What is this medicine? NAPHAZOLINE; PHENIRAMINE is a decongestant and an antihistamine. It is used in the eyes to relieve itching and redness caused by pollen, ragweed, grass, animal hair and dander. This medicine may be used for other purposes; ask your health care provider or pharmacist if you have questions. What should I tell my health care provider before I take this medicine? They need to know if you have any of these conditions: -glaucoma -heart disease -high blood pressure -prostate disease -an unusual or allergic reaction to naphazoline, pheniramine, other medicines, foods, dyes, or preservatives -pregnant or trying to get pregnant -breast-feeding How should I use this medicine? This medicine is only for use in the eye. Follow the directions on the label. Wash hands before and after use. Remove contact lenses before using. Shake well before using. Tilt your head back slightly and pull your lower eyelid down with your index finger to form a pouch. Try not to touch the tip of the dropper or tube to your eye, fingertips, or other surface. Squeeze the drops into the pouch. Close the eye gently to spread the drops. Do not use your medicine more often than directed. Talk to your pediatrician regarding the use of this medicine in children. While this drug may be used in children as young as 6 years for selected conditions, precautions do apply. Overdosage: If you think you have taken too much of this medicine contact a poison control center or emergency room at once. NOTE: This medicine is only for you. Do not share this medicine with others. What if I miss a dose? This does not  apply. What may interact with this medicine? Interactions are not expected. Do not use any other eye products without talking to your doctor or health care professional. This list may not describe all possible interactions. Give your health care provider a list of all the medicines, herbs, non-prescription drugs, or dietary supplements you use. Also tell them if you smoke, drink alcohol, or use illegal drugs. Some items may interact with your medicine. What should I watch for while using this medicine? Tell your doctor or healthcare professional if your symptoms do not start to get better within 72 hours or if they get worse. Do not use this medicine for longer than directed by your doctor or health care professional. Do not use this medicine if it changes color or becomes cloudy. What side effects may I notice from receiving this medicine? Side effects that you should report to your doctor or health care professional as soon as possible: -allergic reactions like skin rash, itching or hives, swelling of the face, lips, or tongue -changes in vision -eye pain Side effects that usually do not require medical attention (report these to your doctor or health care professional if they continue or are bothersome): -eye irritation This list may not describe all possible side effects. Call your doctor for medical advice about side effects. You may report side effects to FDA at 1-800-FDA-1088. Where should I keep my medicine? Keep out of the reach of children. Store at room temperature between 15 and 30 degrees C (59 and 86 degrees F). Protect from light. Throw away any unused medicine after  the expiration date. NOTE: This sheet is a summary. It may not cover all possible information. If you have questions about this medicine, talk to your doctor, pharmacist, or health care provider.    2016, Elsevier/Gold Standard. (2013-03-28 10:36:40)

## 2016-03-23 NOTE — ED Notes (Signed)
Pt states that he awoke tonight to a red swollen right eye. Denies any injury to the eye.

## 2016-03-23 NOTE — ED Notes (Signed)
pts sclera appears to be swollen past the iris. Denies contact use.  Denies pain, but irritation when he blinks.

## 2016-03-23 NOTE — ED Provider Notes (Signed)
CSN: [id number]     Arrival date & time 03/23/16  0151 History  By signing my name below, I, Higinio Plan, attest that this documentation has been prepared under the direction and in the presence of Delora Fuel, MD . Electronically Signed: Higinio Plan, Scribe. 03/23/2016. 3:23 AM.   Chief Complaint  Patient presents with  . Eye Problem   The history is provided by the patient. No language interpreter was used.   HPI Comments:  Sean Walter is a 45 y.o. male who presents to the Emergency Department complaining of sudden onset, gradually worsening, right eye pain and redness that began 5 hours PTA. He reports associated symptoms of swelling and itching. Pt describes the swelling as a bubble on his eye. He notes that this is the first time he has experienced these symptoms. Pt denies visual disturbance and any other symptoms. No alleviating factors noted.  Past Medical History  Diagnosis Date  . Asthma, cold induced   . Herpes   . Asthma   . No pertinent past medical history     enlarged spleen  . Spleen enlargement     myofibrosis need bonemarrow transplant  . Myelofibrosis ([place])     followed by Dr. Sherryl Manges  . 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 Mandible fracture surgery    . Mandibular hardware removal  04/10/2012    Procedure: MANDIBULAR HARDWARE REMOVAL;  Surgeon: Jodi Marble, MD;  Location: [facility];  Service: ENT;  Laterality: Right;   No family history on file. Social History  Substance Use Topics  . Smoking status: Current Some Day Smoker -- 0.30 packs/day    Types: Cigarettes  . Smokeless tobacco: None     Comment: 2-3  cigs per day  . Alcohol Use: 0.0 oz/week    0 Standard drinks or equivalent per week     Comment: daily     Review of Systems  Eyes: Positive for pain, redness and itching. Negative for visual disturbance.   All other systems reviewed and are negative.     Allergies  Ibuprofen  Home Medications   Prior to Admission medications   Medication Sig Start Date End Date Taking? Authorizing Provider  albuterol (PROVENTIL HFA;VENTOLIN HFA) 108 (90 BASE) MCG/ACT inhaler Inhale 2 puffs into the lungs every 6 (six) hours as needed for wheezing or shortness of breath.     Historical Provider, MD  diclofenac sodium (VOLTAREN) 1 % GEL Apply 2 g topically 4 (four) times daily. 12/01/14   Rushil Sherrye Payor, MD  esomeprazole (NEXIUM) 40 MG capsule Take 1 capsule (40 mg total) by mouth daily. 02/07/16 02/06/17  [name], MD  imiquimod [place]) 5 % cream  11/05/14   Historical Provider, MD  meloxicam (MOBIC) 15 MG tablet Take 1 tablet (15 mg total) by mouth daily. 12/11/14   Dene Gentry, MD  oxyCODONE-acetaminophen (PERCOCET) 10-325 MG tablet Take 1 tablet by mouth every 6 (six) hours as needed for pain. 03/08/16 03/08/17  [name], MD  ruxolitinib phosphate (JAKAFI) 20 MG tablet Take 1 tablet (20 mg total) by mouth 2 (two) times daily. 11/22/15   [name], MD  valACYclovir (VALTREX) 1000 MG tablet Take 2 tablets (2,000 mg total) by mouth 2 (two) times daily. 03/17/16   Riccardo Dubin, MD   Triage Vitals: BP 159/93 mmHg  Pulse 64  Temp(Src)  98.1 F (36.7 C) (Oral)  Resp 16  SpO2 100% Physical Exam  Constitutional: He is oriented to person, place, and time. He appears well-developed and well-nourished.  HENT:  Head: Normocephalic and atraumatic.  Eyes: Conjunctivae and EOM are normal. Pupils are equal, round, and reactive to light. Right eye exhibits no discharge. Left eye exhibits no discharge.  erythema and subconjunctival edema of lower half of the right eye   Neck: Normal range of motion. Neck supple. No JVD present.  Cardiovascular: Normal rate, regular rhythm and normal heart sounds.   No murmur heard. Pulmonary/Chest: Effort normal and breath sounds normal. He has no  wheezes. He has no rales. He exhibits no tenderness.  Abdominal: Soft. Bowel sounds are normal. He exhibits no distension and no mass. There is no tenderness.  Musculoskeletal: Normal range of motion. He exhibits no edema.  Lymphadenopathy:    He has no cervical adenopathy.  Neurological: He is alert and oriented to person, place, and time. No cranial nerve deficit. He exhibits normal muscle tone. Coordination normal.  Skin: Skin is warm and dry. No rash noted.  Psychiatric: He has a normal mood and affect. His behavior is normal. Judgment and thought content normal.  Nursing note and vitals reviewed.   ED Course  Procedures  DIAGNOSTIC STUDIES:  Oxygen Saturation is 100% on RA, normal by my interpretation.    COORDINATION OF CARE:  3:39 AM Discussed treatment plan, which includes vision check and allergy medication with pt at bedside and pt agreed to plan.    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 It is interesting to note that the swelling and redness are not present over the part of the eye which is covered by the upper lid. Visual acuity is normal and no evidence of more serious ocular pathology. He is given additional dose of dexamethasone and is sent home with prescription for naphazoline pheniramine drops.  I personally performed the services described in this documentation, which was scribed in my presence. The recorded information has been reviewed and is accurate.      Delora Fuel, MD [id number]

## 2016-03-31 ENCOUNTER — Other Ambulatory Visit: Payer: Self-pay

## 2016-03-31 NOTE — Telephone Encounter (Signed)
Last appointment: 03/17/2016 No future appointments Last UDS: 12/01/2015 (+ THC and alcohol) Last written: 03/08/2016

## 2016-03-31 NOTE — Telephone Encounter (Signed)
Pt requesting percocet to be filled. °

## 2016-04-04 MED ORDER — OXYCODONE-ACETAMINOPHEN 10-325 MG PO TABS: 1.0000 | ORAL_TABLET | Freq: Four times a day (QID) | ORAL | Status: DC | PRN

## 2016-04-04 NOTE — Telephone Encounter (Signed)
As this patient was seen by Dr. Beryle Beams and deemed necessary for their treatment, I will refill this prescription for Percocet 10/325 x 50 tablets to be taken 1 tablet every 6 hours as needed. Per review of the [place] DEA database, this medication was last refilled 03/07/16 and has not been refilled over the time interval.

## 2016-04-04 NOTE — Telephone Encounter (Signed)
Pt informed, he says he will come by tomorrow

## 2016-04-27 ENCOUNTER — Other Ambulatory Visit: Payer: Self-pay | Admitting: Oncology

## 2016-04-27 MED ORDER — OXYCODONE-ACETAMINOPHEN 10-325 MG PO TABS: 1.0000 | ORAL_TABLET | Freq: Four times a day (QID) | ORAL | Status: DC | PRN

## 2016-04-28 ENCOUNTER — Other Ambulatory Visit: Payer: Self-pay

## 2016-04-28 NOTE — Telephone Encounter (Signed)
Requesting percocet to be filled.  

## 2016-04-28 NOTE — Telephone Encounter (Signed)
Called again, pt answered, informed script ready

## 2016-05-24 ENCOUNTER — Other Ambulatory Visit: Payer: Self-pay

## 2016-05-24 NOTE — Telephone Encounter (Signed)
Requesting percocet to be filled.  

## 2016-05-25 MED ORDER — OXYCODONE-ACETAMINOPHEN 10-325 MG PO TABS: 1.0000 | ORAL_TABLET | Freq: Four times a day (QID) | ORAL | 0 refills | Status: DC | PRN

## 2016-05-25 NOTE — Telephone Encounter (Signed)
Per the new [facility] policy on pain meds, I think I will have to defer to Dr. Beryle Beams to write these medications since he is the one who has this patient on the pain contract.  I am copying him to verify if I am correct.

## 2016-05-25 NOTE — Telephone Encounter (Signed)
Rx ready - pt called; will pick up tomorrow am after 0930 AM.

## 2016-05-26 ENCOUNTER — Other Ambulatory Visit (INDEPENDENT_AMBULATORY_CARE_PROVIDER_SITE_OTHER): Payer: BLUE CROSS/BLUE SHIELD

## 2016-05-26 DIAGNOSIS — D471 Chronic myeloproliferative disease: Secondary | ICD-10-CM

## 2016-05-26 DIAGNOSIS — D47Z9 Other specified neoplasms of uncertain behavior of lymphoid, hematopoietic and related tissue: Secondary | ICD-10-CM

## 2016-05-26 DIAGNOSIS — C946 Myelodysplastic disease, not classified: Secondary | ICD-10-CM

## 2016-05-27 LAB — CBC WITH DIFFERENTIAL/PLATELET
BASOS ABS: 0 10*3/uL (ref 0.0–0.2)
Basos: 0 %
EOS (ABSOLUTE): 0.1 10*3/uL (ref 0.0–0.4)
Eos: 1 %
HEMOGLOBIN: 15.2 g/dL (ref 12.6–17.7)
Hematocrit: 45.6 % (ref 37.5–51.0)
Immature Grans (Abs): 0 10*3/uL (ref 0.0–0.1)
Immature Granulocytes: 0 %
LYMPHS ABS: 0.5 10*3/uL — AB (ref 0.7–3.1)
Lymphs: 6 %
MCH: 27.1 pg (ref 26.6–33.0)
MCHC: 33.3 g/dL (ref 31.5–35.7)
MCV: 81 fL (ref 79–97)
MONOCYTES: 3 %
MONOS ABS: 0.2 10*3/uL (ref 0.1–0.9)
Neutrophils Absolute: 8.4 10*3/uL — ABNORMAL HIGH (ref 1.4–7.0)
Neutrophils: 90 %
PLATELETS: 328 10*3/uL (ref 150–379)
RBC: 5.6 x10E6/uL (ref 4.14–5.80)
RDW: 14.5 % (ref 12.3–15.4)
WBC: 9.3 10*3/uL (ref 3.4–10.8)

## 2016-05-27 LAB — COMPREHENSIVE METABOLIC PANEL
ALK PHOS: 75 IU/L (ref 39–117)
ALT: 23 IU/L (ref 0–44)
AST: 21 IU/L (ref 0–40)
Albumin/Globulin Ratio: 1.6 (ref 1.2–2.2)
Albumin: 4.6 g/dL (ref 3.5–5.5)
BILIRUBIN TOTAL: 0.4 mg/dL (ref 0.0–1.2)
BUN/Creatinine Ratio: 5 — ABNORMAL LOW (ref 9–20)
BUN: 6 mg/dL (ref 6–24)
CHLORIDE: 100 mmol/L (ref 96–106)
CO2: 21 mmol/L (ref 18–29)
CREATININE: 1.13 mg/dL (ref 0.76–1.27)
Calcium: 8.8 mg/dL (ref 8.7–10.2)
GFR calc Af Amer: 91 mL/min/{1.73_m2} (ref >59)
GFR calc non Af Amer: 79 mL/min/{1.73_m2} (ref >59)
GLUCOSE: 101 mg/dL — AB (ref 65–99)
Globulin, Total: 2.9 g/dL (ref 1.5–4.5)
Potassium: 4 mmol/L (ref 3.5–5.2)
Sodium: 141 mmol/L (ref 134–144)
Total Protein: 7.5 g/dL (ref 6.0–8.5)

## 2016-05-27 LAB — LACTATE DEHYDROGENASE: LDH: 341 IU/L — AB (ref 121–224)

## 2016-06-05 ENCOUNTER — Ambulatory Visit (INDEPENDENT_AMBULATORY_CARE_PROVIDER_SITE_OTHER): Payer: BLUE CROSS/BLUE SHIELD | Admitting: Oncology

## 2016-06-05 ENCOUNTER — Encounter: Payer: Self-pay | Admitting: Oncology

## 2016-06-05 VITALS — BP 144/83 | HR 81 | Temp 98.3°F | Ht 72.0 in | Wt 215.8 lb

## 2016-06-05 DIAGNOSIS — C946 Myelodysplastic disease, not classified: Secondary | ICD-10-CM

## 2016-06-05 DIAGNOSIS — D47Z9 Other specified neoplasms of uncertain behavior of lymphoid, hematopoietic and related tissue: Secondary | ICD-10-CM | POA: Diagnosis not present

## 2016-06-05 DIAGNOSIS — R161 Splenomegaly, not elsewhere classified: Secondary | ICD-10-CM

## 2016-06-05 DIAGNOSIS — R748 Abnormal levels of other serum enzymes: Secondary | ICD-10-CM | POA: Diagnosis not present

## 2016-06-05 DIAGNOSIS — L989 Disorder of the skin and subcutaneous tissue, unspecified: Secondary | ICD-10-CM

## 2016-06-05 DIAGNOSIS — Z79891 Long term (current) use of opiate analgesic: Secondary | ICD-10-CM

## 2016-06-05 DIAGNOSIS — N529 Male erectile dysfunction, unspecified: Secondary | ICD-10-CM

## 2016-06-05 HISTORY — DX: Male erectile dysfunction, unspecified: N52.9

## 2016-06-05 MED ORDER — SILDENAFIL CITRATE 50 MG PO TABS: 50.0000 mg | ORAL_TABLET | ORAL | 1 refills | Status: DC | PRN

## 2016-06-05 NOTE — Progress Notes (Signed)
Hematology and Oncology Follow Up Visit  Sean Walter [id number] Mar 05, 1971 45 y.o. 06/05/2016 10:01 AM   Principle Diagnosis: Encounter Diagnoses  Name Primary?  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 Erectile dysfunction, unspecified erectile dysfunction type   Clinical Summary: 45 year old man who was initially called myelofibrosis but who more likely has a non-BCR-ABL myeloproliferative disorder. He presented with leukocytosis and massive splenomegaly in January 2012. Bone marrow biopsy done in 11/16/10 was hypercellular with prominent proliferation of megakaryocytes many of which had abnormal morphology. Collagen and reticulin stains were focally positive. There were no excess blasts; in fact, on a 500 cell count differential, 0% blasts were recorded. Routine cytogenetics were normal but FISH probes were not done. Peripheral blood was negative for the presence of BCR-ABL transcripts. He is [name]. (03/07/13)  The patient was started on JAKOFI in February, 2012. Current dose is 20 mg twice daily. He has had doses as high as 25 mg twice daily.  I have followed him since July of 2013. Blood counts initially stable and there was a significant decrease in his spleen size although he continued to complain of pain and requested Percocet on a regular basis.  He developed cumulative myelotoxicity requiring a dose reduction from 25 mg twice daily down to 20 mg twice daily in late August 2015. There was a discrepancy in my notes where I said I was decreasing him to 50 mg twice daily in August 2015. It  came to my attention that the patient was actually only taking 10 mg twice daily. It is no surprise that his blood counts completely recovered but also that his spleen started to enlarge again at time of most recent ultrasound done on 01/08/2015 with spleen dimensions 17 x 6 x 17 cm and volume 1189 mL cubed. I wrote a new prescription for 20 mg  twice daily which he started taking on 01/31/2015. Blood counts stabilized. Ultrasound of the spleen done on 07/08/2015 showed spleen size mildly decreased compared with the March study now measuring 16 x 5 x 17 cm.    Interim History:   Overall doing well. No interim medical problems. He is having some focal areas of nonspecific scaly dermatitis on the dorsa of his hands. The verrucous rash on his scalp and neck has almost completely resolved after using topical Aldara cream. He has had no interim infections. He still uses Valtrex on a as-needed basis for flareups of herpes zoster. He has not needed to do this in a while. He denies any cardiorespiratory symptoms. He did bring up a topic that he was somewhat embarrassed to talk about. He has been having trouble initiating and maintaining an erection. He has twin sons. One of them has now moved to [facility] and Mr. Peets was able to get him a job at [employer] where he works. His blood counts have been stable on the current dose of JAKAFI noted 20 mg twice daily. He still gets intermittent left upper quadrant discomfort from his splenomegaly. Following a lengthy discussion at time of last visit he was amenable to a tapering his chronic Percocet currently down to just 50 tablets per month.   Medications: reviewed  Allergies:  Allergies  Allergen Reactions  . Ibuprofen Other (See Comments)    Indigestion Indigestion    Review of Systems: See interim history Remaining ROS negative:   Physical Exam: Blood pressure (!) 144/83, pulse 81, temperature 98.3 F (36.8 C), temperature source Oral, height 6' (1.829 m), weight 215  lb 12.8 oz (97.9 kg), SpO2 100 %. Wt Readings from Last 3 Encounters:  06/05/16 215 lb 12.8 oz (97.9 kg)  03/17/16 219 lb 9.6 oz (99.6 kg)  02/07/16 212 lb 4.8 oz (96.3 kg)     General appearance: Muscular African-American man. HENNT: Pharynx no erythema, exudate, mass, or ulcer. No thyromegaly or thyroid nodules Lymph  nodes: No cervical, supraclavicular, or axillary lymphadenopathy Breasts:  Lungs: Clear to auscultation, resonant to percussion throughout Heart: Regular rhythm, no murmur, no gallop, no rub, no click, no edema Abdomen: Soft, nontender, normal bowel sounds, no mass, spleen exam has always been difficult. Stable findings compared with my prior exams. I cannot feel a definite spleen edge. This is always discrepant with findings on the ultrasound. Extremities: No edema, no calf tenderness Musculoskeletal: no joint deformities GU:  Vascular: Carotid pulses 2+, no bruits, distal pulses: Dorsalis pedis 1+ symmetric Neurologic: Alert, oriented, PERRLA,  cranial nerves grossly normal, motor strength 5 over 5, reflexes 1+ symmetric, upper body coordination normal, gait normal, Skin: Multiple tattoos. Minimal residual verrucous rash on the back of his neck. Resolved on the scalp. Head is shaved bald. Focal areas of scaly dermatitis on the dorsa of his hands.   Lab Results: CBC W/Diff    Component Value Date/Time   WBC 9.3 05/26/2016 1117   WBC 7.6 04/30/2015 1335   RBC 5.60 05/26/2016 1117   RBC 5.08 04/30/2015 1335   HGB 13.5 04/30/2015 1335   HGB 12.0 (L) 01/05/2014 1035   HCT 45.6 05/26/2016 1117   HCT 37.1 (L) 01/05/2014 1035   PLT 328 05/26/2016 1117   MCV 81 05/26/2016 1117   MCV 79.1 (L) 01/05/2014 1035   MCH 27.1 05/26/2016 1117   MCH 26.6 04/30/2015 1335   MCHC 33.3 05/26/2016 1117   MCHC 32.7 04/30/2015 1335   RDW 14.5 05/26/2016 1117   RDW 16.8 (H) 01/05/2014 1035   LYMPHSABS 0.5 (L) 05/26/2016 1117   LYMPHSABS 0.7 (L) 01/05/2014 1035   MONOABS 0.2 04/30/2015 1335   MONOABS 0.3 01/05/2014 1035   EOSABS 0.1 05/26/2016 1117   BASOSABS 0.0 05/26/2016 1117   BASOSABS 0.1 01/05/2014 1035     Chemistry      Component Value Date/Time   NA 141 05/26/2016 1117   NA 141 01/05/2014 1035   K 4.0 05/26/2016 1117   K 3.9 01/05/2014 1035   CL 100 05/26/2016 1117   CL 106 03/07/2013  0826   CO2 21 05/26/2016 1117   CO2 24 01/05/2014 1035   BUN 6 05/26/2016 1117   BUN 11.3 01/05/2014 1035   CREATININE 1.13 05/26/2016 1117   CREATININE 1.21 04/30/2015 1335   CREATININE 1.8 (H) 01/05/2014 1035      Component Value Date/Time   CALCIUM 8.8 05/26/2016 1117   CALCIUM 9.5 01/05/2014 1035   ALKPHOS 75 05/26/2016 1117   ALKPHOS 57 01/05/2014 1035   AST 21 05/26/2016 1117   AST 21 01/05/2014 1035   ALT 23 05/26/2016 1117   ALT 31 01/05/2014 1035   BILITOT 0.4 05/26/2016 1117   BILITOT 0.49 01/05/2014 1035    White count differential 05/26/2016 90% neutrophils, 6#'s, 3 monocytes,   Radiological Studies: No results found.     No results found.    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 Blood counts and clinical exam stable on JAKOFI 20 mg twice daily. Continue to monitor blood counts monthly. Ultrasound of the spleen quarterly. CT scan annually. Bone  marrow if any significant change in hematologic profile. Ongoing evaluation at [facility] with respect to allogeneic transplant.  No recent follow-up visits.  #2. Transient elevation of creatinine up to 1.8 now back to his baseline of 1.1 and likely normal for his muscle mass.  #3. 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 He signed a pain contract with [place] at time of his last visit. He has agreed to tapering his Vicodin now down to 50 tablets monthly..  #4. Molluscum contagiosum versus simple verrucae skin of neck and scalp with near complete response to topical antiviral cream.  #5. Erectile dysfunction. He has had no problems fathering 6 children! I will put him on a trial of Viagra 50 mg as needed.   CC: Patient Care Team: Riccardo Dubin, MD as PCP - General (Internal Medicine)   [name], MD 8/14/201710:01 AM

## 2016-06-05 NOTE — Patient Instructions (Signed)
Continue Jakofi at current dose 20 mg twice daily Schedule ultrasound abdomen to assess spleen size Begin trial of Viagra 50 mg for erectile dysfunction Continue lab checks monthly MD visit 4 months

## 2016-06-20 ENCOUNTER — Other Ambulatory Visit: Payer: Self-pay

## 2016-06-20 NOTE — Telephone Encounter (Signed)
Requesting percocet to be filled.  

## 2016-06-20 NOTE — Telephone Encounter (Signed)
Last refill written 05/25/16. UDS 12/01/15. Last OV 06/05/16. Pain contract 02/07/16.

## 2016-06-21 MED ORDER — OXYCODONE-ACETAMINOPHEN 10-325 MG PO TABS: 1.0000 | ORAL_TABLET | Freq: Four times a day (QID) | ORAL | 0 refills | Status: DC | PRN

## 2016-06-21 NOTE — Telephone Encounter (Signed)
Rx ready for pick up ; pt called / informed. 

## 2016-06-23 ENCOUNTER — Other Ambulatory Visit: Payer: BLUE CROSS/BLUE SHIELD

## 2016-06-25 ENCOUNTER — Encounter (HOSPITAL_COMMUNITY): Payer: Self-pay | Admitting: Emergency Medicine

## 2016-06-25 ENCOUNTER — Emergency Department (HOSPITAL_COMMUNITY): Payer: BLUE CROSS/BLUE SHIELD

## 2016-06-25 ENCOUNTER — Emergency Department (HOSPITAL_COMMUNITY)
Admission: EM | Admit: 2016-06-25 | Discharge: 2016-06-25 | Disposition: A | Discharge status: Home / Self Care | Payer: BLUE CROSS/BLUE SHIELD | Attending: Emergency Medicine | Admitting: Emergency Medicine

## 2016-06-25 DIAGNOSIS — F17210 Nicotine dependence, cigarettes, uncomplicated: Secondary | ICD-10-CM | POA: Insufficient documentation

## 2016-06-25 DIAGNOSIS — Y999 Unspecified external cause status: Secondary | ICD-10-CM | POA: Diagnosis not present

## 2016-06-25 DIAGNOSIS — W228XXA Striking against or struck by other objects, initial encounter: Secondary | ICD-10-CM | POA: Insufficient documentation

## 2016-06-25 DIAGNOSIS — Y939 Activity, unspecified: Secondary | ICD-10-CM | POA: Insufficient documentation

## 2016-06-25 DIAGNOSIS — S6991XA Unspecified injury of right wrist, hand and finger(s), initial encounter: Secondary | ICD-10-CM | POA: Diagnosis present

## 2016-06-25 DIAGNOSIS — J45909 Unspecified asthma, uncomplicated: Secondary | ICD-10-CM | POA: Diagnosis not present

## 2016-06-25 DIAGNOSIS — Y929 Unspecified place or not applicable: Secondary | ICD-10-CM | POA: Insufficient documentation

## 2016-06-25 DIAGNOSIS — S62316A Displaced fracture of base of fifth metacarpal bone, right hand, initial encounter for closed fracture: Secondary | ICD-10-CM | POA: Insufficient documentation

## 2016-06-25 DIAGNOSIS — S62308A Unspecified fracture of other metacarpal bone, initial encounter for closed fracture: Secondary | ICD-10-CM

## 2016-06-25 NOTE — ED Provider Notes (Signed)
[facility] DEPT Provider Note   CSN: [id number] Arrival date & time: 06/25/16  0454     History   Chief Complaint Chief Complaint  Patient presents with  . Hand Injury    HPI Sean Walter is a 45 y.o. male.  Patient is a 45 year old male who presents with complaints of right hand pain and swelling. He reports hitting a desk 2 days ago when he was upset. He has noticed some bruising to the palm over the fourth and fifth metacarpals.   The history is provided by the patient.  Hand Injury   The incident occurred 2 days ago. The pain is present in the right hand. The pain has been constant since the incident. Pertinent negatives include no fever. The symptoms are aggravated by movement, palpation and use. He has tried nothing for the symptoms.    Past Medical History:  Diagnosis Date  . Asthma   . Asthma, cold induced   . Erectile dysfunction 06/05/2016  . Herpes   . 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 Myelofibrosis ([place])    followed by Dr. Sherryl Manges  . No pertinent past medical history    enlarged spleen  . Spleen enlargement    myofibrosis need bonemarrow transplant  . 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 Erectile dysfunction 06/05/2016  . Elevated blood pressure 03/07/2015  . Neck pain 12/14/2014  . Left shoulder pain 11/24/2014  . Folliculitis [id number]  . Lower back pain 10/27/2014  . 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 Primary myelofibrosis ([place])   . Spleen enlargement     Past Surgical History:  Procedure Laterality Date  . MANDIBLE FRACTURE SURGERY    . MANDIBULAR HARDWARE REMOVAL  04/10/2012   Procedure: MANDIBULAR HARDWARE REMOVAL;  Surgeon: Jodi Marble, MD;  Location: [facility];  Service: ENT;  Laterality: Right;        Home Medications    Prior to Admission medications   Medication Sig Start Date End Date Taking? Authorizing Provider  albuterol (PROVENTIL HFA;VENTOLIN HFA) 108 (90 BASE) MCG/ACT inhaler Inhale 2 puffs into the lungs every 6 (six) hours as needed for wheezing or shortness of breath.     Historical Provider, MD  esomeprazole (NEXIUM) 40 MG capsule Take 1 capsule (40 mg total) by mouth daily. 02/07/16 02/06/17  [name], MD  imiquimod Leroy Sea) 5 % cream  11/05/14   Historical Provider, MD  naphazoline-pheniramine (NAPHCON-A) 0.025-0.3 % ophthalmic solution Place 1 drop into the right eye every 4 (four) hours as needed for irritation. [id number]   Delora Fuel, MD  oxyCODONE-acetaminophen (PERCOCET) 10-325 MG tablet Take 1 tablet by mouth every 6 (six) hours as needed for pain. 06/21/16 06/17/17  [name], MD  ruxolitinib phosphate (JAKAFI) 20 MG tablet Take 1 tablet (20 mg total) by mouth 2 (two) times daily. 11/22/15   [name], MD  sildenafil (VIAGRA) 50 MG tablet Take 1 tablet (50 mg total) by mouth as needed for erectile dysfunction. 06/05/16 06/05/17  [name], MD  valACYclovir (VALTREX) 1000 MG tablet Take 2 tablets (2,000 mg total) by mouth 2 (two) times daily. 03/17/16   Riccardo Dubin, MD    Family History No family history on file.  Social History Social History  Substance Use Topics  . Smoking status: Current Some Day Smoker  Packs/day: 0.30    Types: Cigarettes  . Smokeless tobacco: Never Used     Comment: 2-3  cigs per day  . Alcohol use 0.0 oz/week     Comment: daily      Allergies   Ibuprofen   Review of Systems Review of Systems  Constitutional: Negative for fever.  All other systems reviewed and are negative.    Physical Exam Updated Vital Signs BP 159/96   Pulse 90   Temp 98.6 F (37 C) (Oral)   Resp 18   SpO2 98%   Physical Exam  Constitutional: He appears well-developed and well-nourished. No distress.   HENT:  Head: Normocephalic and atraumatic.  Neck: Normal range of motion. Neck supple.  Musculoskeletal:  The dorsum of the right hand is noted to have swelling over the fourth and fifth metacarpals, and there is some bruising to the palmar aspect adjacent to the fourth and fifth metacarpals. He has good range of motion of all fingers. Capillary refill is brisk to all fingers and motor and sensory are intact to all fingers.  Skin: Skin is warm and dry. He is not diaphoretic.  Nursing note and vitals reviewed.    ED Treatments / Results  Labs (all labs ordered are listed, but only abnormal results are displayed) Labs Reviewed - No data to display  EKG  EKG Interpretation None       Radiology No results found.  Procedures Procedures (including critical care time)  Medications Ordered in ED Medications - No data to display   Initial Impression / Assessment and Plan / ED Course  I have reviewed the triage vital signs and the nursing notes.  Pertinent labs & imaging results that were available during my care of the patient were reviewed by me and considered in my medical decision making (see chart for details).  Clinical Course    X-rays reveal an angulated fifth metacarpal fracture. You'll be placed in an ulnar gutter splint and advised to follow-up with Dr. Caralyn Guile in the next 2 days.  Final Clinical Impressions(s) / ED Diagnoses   Final diagnoses:  None    New Prescriptions New Prescriptions   No medications on file     Veryl Speak, MD 06/25/16 [phone]

## 2016-06-25 NOTE — Discharge Instructions (Signed)
Wear splint as applied for the next several days until followed up by orthopedics.  Follow-up with Dr. Caralyn Guile in the next 2-3 days. His contact information has been provided in this discharge summary for you to call and make these arrangements.

## 2016-06-25 NOTE — Progress Notes (Signed)
Orthopedic Tech Progress Note Patient Details:  Sean Walter [birth year] [id number]  Ortho Devices Type of Ortho Device: Ulna gutter splint, Ace wrap Ortho Device/Splint Interventions: Application   [place] Pink 06/25/2016, 6:14 AM

## 2016-06-25 NOTE — ED Notes (Signed)
Patient is A&Ox4 at this time.  Patient in no signs of distress.  Please see providers note for complete history and physical exam.  

## 2016-06-25 NOTE — ED Triage Notes (Signed)
Pt was aggravated and hit the corner of a desk. PMS intact. Swelling noted

## 2016-06-29 ENCOUNTER — Other Ambulatory Visit: Payer: Self-pay | Admitting: Internal Medicine

## 2016-06-29 DIAGNOSIS — D47Z9 Other specified neoplasms of uncertain behavior of lymphoid, hematopoietic and related tissue: Secondary | ICD-10-CM

## 2016-06-29 DIAGNOSIS — C946 Myelodysplastic disease, not classified: Secondary | ICD-10-CM

## 2016-06-29 DIAGNOSIS — D471 Chronic myeloproliferative disease: Secondary | ICD-10-CM

## 2016-06-30 ENCOUNTER — Telehealth: Payer: Self-pay | Admitting: *Deleted

## 2016-06-30 ENCOUNTER — Other Ambulatory Visit (INDEPENDENT_AMBULATORY_CARE_PROVIDER_SITE_OTHER): Payer: BLUE CROSS/BLUE SHIELD

## 2016-06-30 ENCOUNTER — Other Ambulatory Visit: Payer: Self-pay | Admitting: Oncology

## 2016-06-30 ENCOUNTER — Ambulatory Visit (HOSPITAL_COMMUNITY)
Admission: RE | Admit: 2016-06-30 | Discharge: 2016-06-30 | Disposition: A | Discharge status: Home / Self Care | Payer: BLUE CROSS/BLUE SHIELD | Source: Ambulatory Visit | Attending: Oncology | Admitting: Oncology

## 2016-06-30 DIAGNOSIS — D47Z9 Other specified neoplasms of uncertain behavior of lymphoid, hematopoietic and related tissue: Secondary | ICD-10-CM | POA: Diagnosis present

## 2016-06-30 DIAGNOSIS — C946 Myelodysplastic disease, not classified: Secondary | ICD-10-CM

## 2016-06-30 DIAGNOSIS — D471 Chronic myeloproliferative disease: Secondary | ICD-10-CM

## 2016-06-30 DIAGNOSIS — R161 Splenomegaly, not elsewhere classified: Secondary | ICD-10-CM | POA: Insufficient documentation

## 2016-06-30 NOTE — Telephone Encounter (Signed)
-----   Message from [name], MD sent at 06/30/2016 10:41 AM EDT ----- Call pt spleen size is stable  Not any bigger ----- Message ----- From: Interface, Rad Results In Sent: 06/30/2016  10:01 AM To: [name], MD

## 2016-06-30 NOTE — Telephone Encounter (Signed)
Pt called - no answer; left message" spleen size is stable Not any bigger" per Dr Beryle Beams. And to call for any questions.

## 2016-07-01 LAB — CBC WITH DIFFERENTIAL/PLATELET
Basophils Absolute: 0 x10E3/uL (ref 0.0–0.2)
Basos: 1 %
EOS (ABSOLUTE): 0.1 x10E3/uL (ref 0.0–0.4)
Eos: 2 %
Hematocrit: 42.3 % (ref 37.5–51.0)
Hemoglobin: 13.8 g/dL (ref 12.6–17.7)
Immature Grans (Abs): 0 x10E3/uL (ref 0.0–0.1)
Immature Granulocytes: 0 %
Lymphocytes Absolute: 0.8 x10E3/uL (ref 0.7–3.1)
Lymphs: 14 %
MCH: 26.9 pg (ref 26.6–33.0)
MCHC: 32.6 g/dL (ref 31.5–35.7)
MCV: 83 fL (ref 79–97)
Monocytes Absolute: 0.2 x10E3/uL (ref 0.1–0.9)
Monocytes: 4 %
Neutrophils Absolute: 4.9 x10E3/uL (ref 1.4–7.0)
Neutrophils: 79 %
Platelets: 304 x10E3/uL (ref 150–379)
RBC: 5.13 x10E6/uL (ref 4.14–5.80)
RDW: 15.4 % (ref 12.3–15.4)
WBC: 6.1 x10E3/uL (ref 3.4–10.8)

## 2016-07-01 LAB — LACTATE DEHYDROGENASE: LDH: 329 IU/L — ABNORMAL HIGH (ref 121–224)

## 2016-07-01 LAB — CMP14 + ANION GAP
A/G RATIO: 1.6 (ref 1.2–2.2)
ALK PHOS: 67 IU/L (ref 39–117)
ALT: 30 IU/L (ref 0–44)
AST: 24 IU/L (ref 0–40)
Albumin: 4.3 g/dL (ref 3.5–5.5)
Anion Gap: 15 mmol/L (ref 10.0–18.0)
BUN/Creatinine Ratio: 10 (ref 9–20)
BUN: 13 mg/dL (ref 6–24)
Bilirubin Total: 0.3 mg/dL (ref 0.0–1.2)
CALCIUM: 8.9 mg/dL (ref 8.7–10.2)
CO2: 24 mmol/L (ref 18–29)
CREATININE: 1.33 mg/dL — AB (ref 0.76–1.27)
Chloride: 104 mmol/L (ref 96–106)
GFR calc Af Amer: 75 mL/min/{1.73_m2} (ref >59)
GFR, EST NON AFRICAN AMERICAN: 65 mL/min/{1.73_m2} (ref >59)
GLOBULIN, TOTAL: 2.7 g/dL (ref 1.5–4.5)
Glucose: 81 mg/dL (ref 65–99)
Potassium: 4.8 mmol/L (ref 3.5–5.2)
SODIUM: 143 mmol/L (ref 134–144)
Total Protein: 7 g/dL (ref 6.0–8.5)

## 2016-07-12 ENCOUNTER — Other Ambulatory Visit: Payer: Self-pay | Admitting: Internal Medicine

## 2016-07-12 NOTE — Telephone Encounter (Signed)
Last refill 06/21/16. Last OV 06/05/16. No f/u appt scheduled. UDS 12/01/15.

## 2016-07-12 NOTE — Telephone Encounter (Signed)
Asking for refill on pain medication. Knows its not due til next week but wanted to go ahead and call so it would be ready

## 2016-07-17 NOTE — Telephone Encounter (Signed)
Pt calling to check status of refill

## 2016-07-18 MED ORDER — OXYCODONE-ACETAMINOPHEN 10-325 MG PO TABS: 1.0000 | ORAL_TABLET | Freq: Four times a day (QID) | ORAL | 0 refills | Status: DC | PRN

## 2016-07-18 NOTE — Telephone Encounter (Signed)
Message left on pt's voicemail-rx ready for pick-up.Despina Hidden Cassady9/26/20171:54 PM

## 2016-07-27 ENCOUNTER — Other Ambulatory Visit: Payer: Self-pay | Admitting: Oncology

## 2016-07-27 DIAGNOSIS — R161 Splenomegaly, not elsewhere classified: Secondary | ICD-10-CM

## 2016-07-27 DIAGNOSIS — D471 Chronic myeloproliferative disease: Secondary | ICD-10-CM

## 2016-07-27 DIAGNOSIS — D47Z9 Other specified neoplasms of uncertain behavior of lymphoid, hematopoietic and related tissue: Secondary | ICD-10-CM

## 2016-07-27 DIAGNOSIS — C946 Myelodysplastic disease, not classified: Secondary | ICD-10-CM

## 2016-07-28 ENCOUNTER — Other Ambulatory Visit (INDEPENDENT_AMBULATORY_CARE_PROVIDER_SITE_OTHER): Payer: BLUE CROSS/BLUE SHIELD

## 2016-07-28 DIAGNOSIS — C946 Myelodysplastic disease, not classified: Secondary | ICD-10-CM

## 2016-07-28 DIAGNOSIS — D471 Chronic myeloproliferative disease: Secondary | ICD-10-CM | POA: Diagnosis not present

## 2016-07-28 DIAGNOSIS — D47Z9 Other specified neoplasms of uncertain behavior of lymphoid, hematopoietic and related tissue: Secondary | ICD-10-CM

## 2016-07-28 DIAGNOSIS — R161 Splenomegaly, not elsewhere classified: Secondary | ICD-10-CM | POA: Diagnosis not present

## 2016-07-29 LAB — COMPREHENSIVE METABOLIC PANEL
A/G RATIO: 1.6 (ref 1.2–2.2)
ALBUMIN: 4.5 g/dL (ref 3.5–5.5)
ALT: 36 IU/L (ref 0–44)
AST: 21 IU/L (ref 0–40)
Alkaline Phosphatase: 91 IU/L (ref 39–117)
BILIRUBIN TOTAL: 0.4 mg/dL (ref 0.0–1.2)
BUN / CREAT RATIO: 8 — AB (ref 9–20)
BUN: 9 mg/dL (ref 6–24)
CALCIUM: 9.3 mg/dL (ref 8.7–10.2)
CO2: 23 mmol/L (ref 18–29)
Chloride: 98 mmol/L (ref 96–106)
Creatinine, Ser: 1.12 mg/dL (ref 0.76–1.27)
GFR, EST AFRICAN AMERICAN: 92 mL/min/{1.73_m2} (ref >59)
GFR, EST NON AFRICAN AMERICAN: 79 mL/min/{1.73_m2} (ref >59)
GLOBULIN, TOTAL: 2.9 g/dL (ref 1.5–4.5)
Glucose: 100 mg/dL — ABNORMAL HIGH (ref 65–99)
POTASSIUM: 4.1 mmol/L (ref 3.5–5.2)
SODIUM: 139 mmol/L (ref 134–144)
TOTAL PROTEIN: 7.4 g/dL (ref 6.0–8.5)

## 2016-07-29 LAB — CBC WITH DIFFERENTIAL/PLATELET
BASOS: 0 %
Basophils Absolute: 0 10*3/uL (ref 0.0–0.2)
EOS (ABSOLUTE): 0.1 10*3/uL (ref 0.0–0.4)
EOS: 1 %
HEMATOCRIT: 41.2 % (ref 37.5–51.0)
HEMOGLOBIN: 13.7 g/dL (ref 12.6–17.7)
IMMATURE GRANS (ABS): 0 10*3/uL (ref 0.0–0.1)
IMMATURE GRANULOCYTES: 0 %
LYMPHS: 5 %
Lymphocytes Absolute: 0.4 10*3/uL — ABNORMAL LOW (ref 0.7–3.1)
MCH: 26.2 pg — AB (ref 26.6–33.0)
MCHC: 33.3 g/dL (ref 31.5–35.7)
MCV: 79 fL (ref 79–97)
Monocytes Absolute: 0.2 10*3/uL (ref 0.1–0.9)
Monocytes: 3 %
NEUTROS ABS: 7.4 10*3/uL — AB (ref 1.4–7.0)
NEUTROS PCT: 91 %
Platelets: 262 10*3/uL (ref 150–379)
RBC: 5.23 x10E6/uL (ref 4.14–5.80)
RDW: 14.9 % (ref 12.3–15.4)
WBC: 8.1 10*3/uL (ref 3.4–10.8)

## 2016-07-29 LAB — LACTATE DEHYDROGENASE: LDH: 349 IU/L — ABNORMAL HIGH (ref 121–224)

## 2016-08-11 ENCOUNTER — Other Ambulatory Visit: Payer: Self-pay

## 2016-08-11 ENCOUNTER — Other Ambulatory Visit: Payer: Self-pay | Admitting: Oncology

## 2016-08-11 MED ORDER — OXYCODONE-ACETAMINOPHEN 10-325 MG PO TABS: 1.0000 | ORAL_TABLET | Freq: Four times a day (QID) | ORAL | 0 refills | Status: DC | PRN

## 2016-08-11 NOTE — Telephone Encounter (Signed)
Percocet rx refilled per Dr Beryle Beams - pt called/informed.

## 2016-08-11 NOTE — Telephone Encounter (Signed)
Last refill 07/18/16. Last appt w/Dr Beryle Beams 8/14 Last appt w/Dr Posey Pronto 5/26 UDS 12/01/15.

## 2016-08-11 NOTE — Telephone Encounter (Signed)
Requesting percocet to be filled.  

## 2016-08-22 ENCOUNTER — Other Ambulatory Visit: Payer: Self-pay | Admitting: Oncology

## 2016-08-22 DIAGNOSIS — R161 Splenomegaly, not elsewhere classified: Secondary | ICD-10-CM

## 2016-08-22 DIAGNOSIS — C946 Myelodysplastic disease, not classified: Secondary | ICD-10-CM

## 2016-08-22 DIAGNOSIS — D471 Chronic myeloproliferative disease: Secondary | ICD-10-CM

## 2016-08-22 DIAGNOSIS — D47Z9 Other specified neoplasms of uncertain behavior of lymphoid, hematopoietic and related tissue: Secondary | ICD-10-CM

## 2016-08-24 ENCOUNTER — Telehealth: Payer: Self-pay | Admitting: Internal Medicine

## 2016-08-24 NOTE — Telephone Encounter (Signed)
APT. REMINDER CALL, LMTCB °

## 2016-08-25 ENCOUNTER — Other Ambulatory Visit (INDEPENDENT_AMBULATORY_CARE_PROVIDER_SITE_OTHER): Payer: BLUE CROSS/BLUE SHIELD

## 2016-08-25 DIAGNOSIS — D47Z9 Other specified neoplasms of uncertain behavior of lymphoid, hematopoietic and related tissue: Secondary | ICD-10-CM

## 2016-08-25 DIAGNOSIS — R161 Splenomegaly, not elsewhere classified: Secondary | ICD-10-CM

## 2016-08-25 DIAGNOSIS — D471 Chronic myeloproliferative disease: Secondary | ICD-10-CM

## 2016-08-25 DIAGNOSIS — C946 Myelodysplastic disease, not classified: Secondary | ICD-10-CM

## 2016-08-26 LAB — CBC WITH DIFFERENTIAL/PLATELET
BASOS ABS: 0 10*3/uL (ref 0.0–0.2)
BASOS: 0 %
EOS (ABSOLUTE): 0.1 10*3/uL (ref 0.0–0.4)
Eos: 1 %
HEMOGLOBIN: 15.1 g/dL (ref 12.6–17.7)
Hematocrit: 44.3 % (ref 37.5–51.0)
IMMATURE GRANS (ABS): 0 10*3/uL (ref 0.0–0.1)
Immature Granulocytes: 0 %
LYMPHS ABS: 0.7 10*3/uL (ref 0.7–3.1)
LYMPHS: 8 %
MCH: 26.4 pg — AB (ref 26.6–33.0)
MCHC: 34.1 g/dL (ref 31.5–35.7)
MCV: 77 fL — AB (ref 79–97)
MONOCYTES: 3 %
Monocytes Absolute: 0.3 10*3/uL (ref 0.1–0.9)
NEUTROS ABS: 8.2 10*3/uL — AB (ref 1.4–7.0)
Neutrophils: 88 %
Platelets: 320 10*3/uL (ref 150–379)
RBC: 5.72 x10E6/uL (ref 4.14–5.80)
RDW: 14.2 % (ref 12.3–15.4)
WBC: 9.3 10*3/uL (ref 3.4–10.8)

## 2016-08-26 LAB — LACTATE DEHYDROGENASE: LDH: 345 IU/L — ABNORMAL HIGH (ref 121–224)

## 2016-08-26 LAB — CMP14 + ANION GAP
A/G RATIO: 1.7 (ref 1.2–2.2)
ALT: 25 IU/L (ref 0–44)
AST: 18 IU/L (ref 0–40)
Albumin: 4.8 g/dL (ref 3.5–5.5)
Alkaline Phosphatase: 79 IU/L (ref 39–117)
Anion Gap: 18 mmol/L (ref 10.0–18.0)
BILIRUBIN TOTAL: 0.4 mg/dL (ref 0.0–1.2)
BUN / CREAT RATIO: 6 — AB (ref 9–20)
BUN: 7 mg/dL (ref 6–24)
CALCIUM: 9.5 mg/dL (ref 8.7–10.2)
CHLORIDE: 95 mmol/L — AB (ref 96–106)
CO2: 24 mmol/L (ref 18–29)
Creatinine, Ser: 1.19 mg/dL (ref 0.76–1.27)
GFR, EST AFRICAN AMERICAN: 85 mL/min/{1.73_m2} (ref >59)
GFR, EST NON AFRICAN AMERICAN: 74 mL/min/{1.73_m2} (ref >59)
Globulin, Total: 2.8 g/dL (ref 1.5–4.5)
Glucose: 102 mg/dL — ABNORMAL HIGH (ref 65–99)
POTASSIUM: 4.2 mmol/L (ref 3.5–5.2)
SODIUM: 137 mmol/L (ref 134–144)
TOTAL PROTEIN: 7.6 g/dL (ref 6.0–8.5)

## 2016-08-30 ENCOUNTER — Telehealth: Payer: Self-pay | Admitting: Internal Medicine

## 2016-08-30 NOTE — Telephone Encounter (Signed)
[employer] calls to say they have been trying to call pt and deliver his jackifi and they can do neither, he does not respond by ph or mail, so he has not rec'd his current shipment, they have provided him with free med for awhile but unable to at the moment, they are calling as an [place]

## 2016-08-30 NOTE — Telephone Encounter (Signed)
Calling to check if patient is still on a medication.

## 2016-08-31 NOTE — Telephone Encounter (Signed)
Pt called back and states he "caught up" with the company and he should get his shipment tomorrow, states he has not gone without taking it.

## 2016-08-31 NOTE — Telephone Encounter (Signed)
Noted thanks °

## 2016-08-31 NOTE — Telephone Encounter (Signed)
I called his listed ph# and it is the one the company has been using also, I got his vmail, lm for him to call the clinic back

## 2016-08-31 NOTE — Telephone Encounter (Signed)
See if you can get in touch with him to see what is going on. He always shows up to get his percocet - maybe he chANGED HIS PHONE #

## 2016-09-04 ENCOUNTER — Other Ambulatory Visit: Payer: Self-pay | Admitting: Internal Medicine

## 2016-09-04 NOTE — Telephone Encounter (Signed)
Pain med refill °

## 2016-09-05 ENCOUNTER — Other Ambulatory Visit: Payer: Self-pay | Admitting: Oncology

## 2016-09-05 MED ORDER — OXYCODONE-ACETAMINOPHEN 10-325 MG PO TABS: 1.0000 | ORAL_TABLET | Freq: Four times a day (QID) | ORAL | 0 refills | Status: DC | PRN

## 2016-09-05 NOTE — Telephone Encounter (Signed)
Percocet ready for pick up; pt called/informed.

## 2016-09-21 ENCOUNTER — Telehealth: Payer: Self-pay | Admitting: Internal Medicine

## 2016-09-21 NOTE — Telephone Encounter (Signed)
APT. REMINDER CALL, LMTCB °

## 2016-09-22 ENCOUNTER — Other Ambulatory Visit (INDEPENDENT_AMBULATORY_CARE_PROVIDER_SITE_OTHER): Payer: BLUE CROSS/BLUE SHIELD

## 2016-09-22 DIAGNOSIS — C946 Myelodysplastic disease, not classified: Secondary | ICD-10-CM

## 2016-09-22 DIAGNOSIS — D471 Chronic myeloproliferative disease: Secondary | ICD-10-CM

## 2016-09-22 DIAGNOSIS — D47Z9 Other specified neoplasms of uncertain behavior of lymphoid, hematopoietic and related tissue: Secondary | ICD-10-CM

## 2016-09-22 DIAGNOSIS — R161 Splenomegaly, not elsewhere classified: Secondary | ICD-10-CM

## 2016-09-23 LAB — CBC WITH DIFFERENTIAL/PLATELET
BASOS: 1 %
Basophils Absolute: 0.1 10*3/uL (ref 0.0–0.2)
EOS (ABSOLUTE): 0.1 10*3/uL (ref 0.0–0.4)
Eos: 2 %
Hematocrit: 45 % (ref 37.5–51.0)
Hemoglobin: 15.7 g/dL (ref 12.6–17.7)
IMMATURE GRANULOCYTES: 0 %
Immature Grans (Abs): 0 10*3/uL (ref 0.0–0.1)
LYMPHS ABS: 0.9 10*3/uL (ref 0.7–3.1)
Lymphs: 13 %
MCH: 27.4 pg (ref 26.6–33.0)
MCHC: 34.9 g/dL (ref 31.5–35.7)
MCV: 79 fL (ref 79–97)
MONOS ABS: 0.3 10*3/uL (ref 0.1–0.9)
Monocytes: 4 %
NEUTROS PCT: 80 %
Neutrophils Absolute: 6 10*3/uL (ref 1.4–7.0)
PLATELETS: 273 10*3/uL (ref 150–379)
RBC: 5.72 x10E6/uL (ref 4.14–5.80)
RDW: 15.1 % (ref 12.3–15.4)
WBC: 7.4 10*3/uL (ref 3.4–10.8)

## 2016-09-23 LAB — CMP14 + ANION GAP
A/G RATIO: 1.6 (ref 1.2–2.2)
ALBUMIN: 4.6 g/dL (ref 3.5–5.5)
ALK PHOS: 80 IU/L (ref 39–117)
ALT: 37 IU/L (ref 0–44)
ANION GAP: 16 mmol/L (ref 10.0–18.0)
AST: 21 IU/L (ref 0–40)
BILIRUBIN TOTAL: 0.5 mg/dL (ref 0.0–1.2)
BUN / CREAT RATIO: 8 — AB (ref 9–20)
BUN: 10 mg/dL (ref 6–24)
CHLORIDE: 100 mmol/L (ref 96–106)
CO2: 24 mmol/L (ref 18–29)
Calcium: 8.9 mg/dL (ref 8.7–10.2)
Creatinine, Ser: 1.28 mg/dL — ABNORMAL HIGH (ref 0.76–1.27)
GFR calc Af Amer: 78 mL/min/{1.73_m2} (ref >59)
GFR calc non Af Amer: 68 mL/min/{1.73_m2} (ref >59)
Globulin, Total: 2.9 g/dL (ref 1.5–4.5)
Glucose: 102 mg/dL — ABNORMAL HIGH (ref 65–99)
POTASSIUM: 4.5 mmol/L (ref 3.5–5.2)
SODIUM: 140 mmol/L (ref 134–144)
Total Protein: 7.5 g/dL (ref 6.0–8.5)

## 2016-09-23 LAB — LACTATE DEHYDROGENASE: LDH: 348 IU/L — AB (ref 121–224)

## 2016-09-25 ENCOUNTER — Other Ambulatory Visit: Payer: Self-pay

## 2016-09-25 NOTE — Telephone Encounter (Signed)
Per [facility] policy, I'm routing this refill request to Dr. Darnell Level as he was the one who completed the pain contract with the patient.   Thank you.

## 2016-09-25 NOTE — Telephone Encounter (Signed)
Requesting percocet to be filled.  

## 2016-09-26 ENCOUNTER — Telehealth: Payer: Self-pay | Admitting: *Deleted

## 2016-09-26 MED ORDER — OXYCODONE-ACETAMINOPHEN 10-325 MG PO TABS: 1.0000 | ORAL_TABLET | Freq: Four times a day (QID) | ORAL | 0 refills | Status: DC | PRN

## 2016-09-26 NOTE — Telephone Encounter (Signed)
Pt called - no one answer; left message "blood counts all good" per Dr Beryle Beams. And call for any questions.

## 2016-09-26 NOTE — Telephone Encounter (Signed)
Stated Viagra too expensive; please change to something else. Talked to Aquilla Hacker -  Sildenafil (revatio) 20 mg  Take 2-5 tablets is covered by insurance but pt has to take more pills.

## 2016-09-26 NOTE — Telephone Encounter (Signed)
-----   Message from [name], MD sent at 09/25/2016  4:51 PM EST ----- CAll pt: blood counts all good

## 2016-09-26 NOTE — Telephone Encounter (Signed)
Percocet rx ready for pick up; pt called -left message.

## 2016-09-28 ENCOUNTER — Other Ambulatory Visit: Payer: Self-pay | Admitting: Oncology

## 2016-09-28 MED ORDER — SILDENAFIL CITRATE 20 MG PO TABS: 60.0000 mg | ORAL_TABLET | Freq: Every day | ORAL | 3 refills | Status: DC | PRN

## 2016-09-28 NOTE — Telephone Encounter (Signed)
Pt called - no answer; left message of new rx.

## 2016-09-28 NOTE — Telephone Encounter (Signed)
Rx sent to his pharmacy to take 3 tabs at a time as needed

## 2016-10-12 ENCOUNTER — Other Ambulatory Visit: Payer: Self-pay

## 2016-10-12 NOTE — Telephone Encounter (Signed)
Requesting percocet to be filled.  

## 2016-10-18 NOTE — Telephone Encounter (Signed)
Per chart review, Prescriptions are supposed to last a full month and it is has only been 13 days since his last refill.  Will also tag Dr. Beryle Beams to the request as he has filled the medication to see if he is willing to give more medication in the interim.

## 2016-10-18 NOTE — Telephone Encounter (Signed)
Last visit:06/05/2016 Last UDS:12/01/2015 Next appointment: 11/07/2016 Last refill:10/05/2016

## 2016-10-18 NOTE — Telephone Encounter (Signed)
Can you please contact the patient and ask him why he ran out too soon?  If he is having a flare or worsening of his pain, he should be seen in clinic.  We do not approve early refills usually without being seen.   Thanks

## 2016-10-19 ENCOUNTER — Other Ambulatory Visit (INDEPENDENT_AMBULATORY_CARE_PROVIDER_SITE_OTHER): Payer: BLUE CROSS/BLUE SHIELD

## 2016-10-19 ENCOUNTER — Other Ambulatory Visit: Payer: Self-pay

## 2016-10-19 DIAGNOSIS — R161 Splenomegaly, not elsewhere classified: Secondary | ICD-10-CM

## 2016-10-19 DIAGNOSIS — D47Z9 Other specified neoplasms of uncertain behavior of lymphoid, hematopoietic and related tissue: Secondary | ICD-10-CM

## 2016-10-19 DIAGNOSIS — C946 Myelodysplastic disease, not classified: Secondary | ICD-10-CM

## 2016-10-19 DIAGNOSIS — D471 Chronic myeloproliferative disease: Secondary | ICD-10-CM

## 2016-10-19 NOTE — Telephone Encounter (Signed)
He has had issues with multiple providers prescribing his opiates in the past which led to Dr. Beryle Beams placing him on a pain contract. As such, I will defer the request for additional therapy to him since he prescribed the last refill.

## 2016-10-19 NOTE — Telephone Encounter (Signed)
Returned call to patient he reports he is not out yet he was just trying to get Rx done early before he ran out he knows that he can't get it filled yet but just wanted to go ahead and start process

## 2016-10-19 NOTE — Telephone Encounter (Signed)
His PCP will be able to address closer to the refill date.

## 2016-10-19 NOTE — Telephone Encounter (Signed)
Please call pt back regarding pain med.  

## 2016-10-20 LAB — CBC WITH DIFFERENTIAL/PLATELET
BASOS ABS: 0.1 10*3/uL (ref 0.0–0.2)
BASOS: 1 %
EOS (ABSOLUTE): 0.1 10*3/uL (ref 0.0–0.4)
Eos: 1 %
Hematocrit: 45.5 % (ref 37.5–51.0)
Hemoglobin: 15.3 g/dL (ref 13.0–17.7)
Immature Grans (Abs): 0 10*3/uL (ref 0.0–0.1)
Immature Granulocytes: 0 %
Lymphocytes Absolute: 0.5 10*3/uL — ABNORMAL LOW (ref 0.7–3.1)
Lymphs: 6 %
MCH: 26.1 pg — ABNORMAL LOW (ref 26.6–33.0)
MCHC: 33.6 g/dL (ref 31.5–35.7)
MCV: 78 fL — AB (ref 79–97)
MONOS ABS: 0.2 10*3/uL (ref 0.1–0.9)
Monocytes: 2 %
NEUTROS PCT: 90 %
Neutrophils Absolute: 8.2 10*3/uL — ABNORMAL HIGH (ref 1.4–7.0)
PLATELETS: 261 10*3/uL (ref 150–379)
RBC: 5.86 x10E6/uL — AB (ref 4.14–5.80)
RDW: 15.3 % (ref 12.3–15.4)
WBC: 9.1 10*3/uL (ref 3.4–10.8)

## 2016-10-20 LAB — CMP14 + ANION GAP
A/G RATIO: 1.6 (ref 1.2–2.2)
ALT: 47 IU/L — AB (ref 0–44)
ANION GAP: 17 mmol/L (ref 10.0–18.0)
AST: 28 IU/L (ref 0–40)
Albumin: 4.6 g/dL (ref 3.5–5.5)
Alkaline Phosphatase: 90 IU/L (ref 39–117)
BUN/Creatinine Ratio: 7 — ABNORMAL LOW (ref 9–20)
BUN: 8 mg/dL (ref 6–24)
Bilirubin Total: 0.3 mg/dL (ref 0.0–1.2)
CALCIUM: 9.5 mg/dL (ref 8.7–10.2)
CO2: 24 mmol/L (ref 18–29)
CREATININE: 1.18 mg/dL (ref 0.76–1.27)
Chloride: 99 mmol/L (ref 96–106)
GFR, EST AFRICAN AMERICAN: 86 mL/min/{1.73_m2} (ref >59)
GFR, EST NON AFRICAN AMERICAN: 75 mL/min/{1.73_m2} (ref >59)
GLUCOSE: 118 mg/dL — AB (ref 65–99)
Globulin, Total: 2.9 g/dL (ref 1.5–4.5)
POTASSIUM: 4.3 mmol/L (ref 3.5–5.2)
Sodium: 140 mmol/L (ref 134–144)
TOTAL PROTEIN: 7.5 g/dL (ref 6.0–8.5)

## 2016-10-20 LAB — LACTATE DEHYDROGENASE: LDH: 421 IU/L — ABNORMAL HIGH (ref 121–224)

## 2016-10-22 ENCOUNTER — Encounter: Payer: Self-pay | Admitting: Internal Medicine

## 2016-10-22 DIAGNOSIS — Z79891 Long term (current) use of opiate analgesic: Secondary | ICD-10-CM | POA: Insufficient documentation

## 2016-10-22 MED ORDER — OXYCODONE-ACETAMINOPHEN 10-325 MG PO TABS: 1.0000 | ORAL_TABLET | Freq: Four times a day (QID) | ORAL | 0 refills | Status: DC | PRN

## 2016-10-24 ENCOUNTER — Telehealth: Payer: Self-pay | Admitting: *Deleted

## 2016-10-24 ENCOUNTER — Other Ambulatory Visit: Payer: BLUE CROSS/BLUE SHIELD

## 2016-10-24 NOTE — Telephone Encounter (Signed)
Rx ready - pt called/informed. 

## 2016-10-24 NOTE — Telephone Encounter (Signed)
Call from [place] - states pt there to refill Percocet rx; he just picked up rx from here. The pharmacist wanted you to be awared pt also refilling rx from Dr Iran Planas.So every 2 weeks he's refilling a rx.

## 2016-10-24 NOTE — Telephone Encounter (Signed)
Call from pt - states [facility] told him he could not refill rx until the 20th. Also states he received only 15 tabs from the other doctor for his hand. I called and talked to Ebony Hail again - stated pt last refill from Dr Darnell Level was 12/6 and was too early but can bring rx back on the 5th or 6th. Ebony Hail states he's receiving 15 tabs every 2 weeks from Dr Caralyn Guile (wanted to let us know about his double dipping). Called pt back - informed of [facility] response rx can be refilled to 5th or 6th.

## 2016-10-24 NOTE — Telephone Encounter (Signed)
Noted. Thanks.

## 2016-11-07 ENCOUNTER — Encounter: Payer: Self-pay | Admitting: Oncology

## 2016-11-07 ENCOUNTER — Ambulatory Visit (INDEPENDENT_AMBULATORY_CARE_PROVIDER_SITE_OTHER): Payer: BLUE CROSS/BLUE SHIELD | Admitting: Oncology

## 2016-11-07 VITALS — BP 135/89 | HR 60 | Temp 98.0°F | Ht 72.0 in | Wt 220.8 lb

## 2016-11-07 DIAGNOSIS — F17210 Nicotine dependence, cigarettes, uncomplicated: Secondary | ICD-10-CM | POA: Diagnosis not present

## 2016-11-07 DIAGNOSIS — D471 Chronic myeloproliferative disease: Secondary | ICD-10-CM | POA: Diagnosis not present

## 2016-11-07 DIAGNOSIS — Z79891 Long term (current) use of opiate analgesic: Secondary | ICD-10-CM

## 2016-11-07 DIAGNOSIS — R161 Splenomegaly, not elsewhere classified: Secondary | ICD-10-CM

## 2016-11-07 DIAGNOSIS — R7989 Other specified abnormal findings of blood chemistry: Secondary | ICD-10-CM | POA: Diagnosis not present

## 2016-11-07 DIAGNOSIS — F1120 Opioid dependence, uncomplicated: Secondary | ICD-10-CM

## 2016-11-07 DIAGNOSIS — Z886 Allergy status to analgesic agent status: Secondary | ICD-10-CM

## 2016-11-07 NOTE — Patient Instructions (Addendum)
Lab every month; next 11/20/16 Return visit 4 months

## 2016-11-07 NOTE — Progress Notes (Signed)
Hematology and Oncology Follow Up Visit  Sean Walter [id number] 02/16/1971 46 y.o. 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 He presented with leukocytosis and massive splenomegaly in January 2012. Bone marrow biopsy done in 11/16/10 was hypercellular with prominent proliferation of megakaryocytes many of which had abnormal morphology. Collagen and reticulin stains were focally positive. There were no excess blasts; in fact, on a 500 cell count differential, 0% blasts were recorded. Routine cytogenetics were normal but FISH probes were not done. Peripheral blood was negative for the presence of BCR-ABL transcripts. He isJAK-2 gene mutation positive. (03/07/13) The patient was started on JAKOFI in February, 2012. Current dose is 20 mg twice daily. He has had doses as high as 25 mg twice daily.  I have followed him since July of 2013. Blood counts initially stable and there was a significant decrease in his spleen size although he continued to complain of pain and requests  Percocet on a regular basis.  He developed cumulative myelotoxicity requiring a dose reduction from 25 mg twice daily down to 20 mg twice daily in late August 2015. There was a discrepancy in my notes where I said I was decreasing him to 50 mg twice daily in August 2015. It  came to my attention that the patient was actually only taking 10 mg twice daily. It is no surprise that his blood counts completely recovered but also that his spleen started to enlarge again at time; ultrasound done on 01/08/2015 with spleen dimensions 17 x 6 x 17 cm and volume 1189 mL cubed. I wrote a new prescription for 20 mg twice daily which he started taking on 01/31/2015. Blood counts stabilized. Ultrasound of the spleen done on 07/08/2015 showed spleen size mildly decreased  compared with the March study now measuring 16 x 5 x 17 cm. most recent ultrasound done 06/30/2016: Spleen 18.3 x 16.2 x 4.9 cm. 758 mm.   Interim History:   Blood counts are remaining stable on JAKOFI 20 mg twice daily. Lab in anticipation of today's visit on 10/19/2016 with hemoglobin 15, white count 9100 with 90% neutrophils, 6% lymphocytes, platelet count 261,000. LDH running higher than his baseline at 421. Renal function remained stable with creatinine 1.2 in this muscular man. Wartlike eruption on his scalp almost completely resolved with topical Aldara cream. He denies any new side effects from his JAKOFI.  It came to my attention that he has been getting narcotic analgesics from other prescribers. I went to the [facility] database. 4 different home addresses are listed. Under the first address [address] I found a prescription for a 13 day supply of oxycodone acetaminophen prescribed him September 5, a 25 day supply prescribed on September 8 by Dr. Gilman Schmidt. An additional 15 day supply prescribed on 10/11/2016 by a Karma Greaser. For unclear reasons, the prescription that I wrote on December 31 was not listed. I confronted the patient with this information. I have been trying to taper him off his chronic oxycodone acetaminophen and we have a narcotic contract where I am designated as the sole provider. I thought I had him down to just 40 tablets per month. He states that he hurt his right hand and that is the reason he was prescribed the extra medication.   Medications: reviewed  Allergies:  Allergies  Allergen Reactions  . Ibuprofen Other (See Comments)  Indigestion Indigestion    Review of Systems: See interim history Remaining ROS negative:   Physical Exam: Blood pressure 135/89, pulse 60, temperature 98 F (36.7 C), temperature source Oral, height 6' (1.829 m), weight 220 lb 12.8 oz (100.2 kg), SpO2 99 %. Wt Readings from Last 3 Encounters:  11/07/16 220  lb 12.8 oz (100.2 kg)  06/05/16 215 lb 12.8 oz (97.9 kg)  03/17/16 219 lb 9.6 oz (99.6 kg)     General appearance: muscular AA man  HENNT: Pharynx no erythema, exudate, mass, or ulcer. No thyromegaly or thyroid nodules Lymph nodes: No cervical, supraclavicular, or axillary lymphadenopathy Breasts:  Lungs: Clear to auscultation, resonant to percussion throughout Heart: Regular rhythm, no murmur, no gallop, no rub, no click, no edema Abdomen: Soft, nontender, normal bowel sounds, no mass,spleen:3-4 centimeters below left costal margin Extremities: No edema, no calf tenderness Musculoskeletal: no joint deformities GU:  Vascular: Carotid pulses 2+, no bruits,  Neurologic: Alert, oriented, PERRLA,, cranial nerves grossly normal, motor strength 5 over 5, reflexes 1+ symmetric, upper body coordination normal, gait normal, Skin: No rash or ecchymosis  Lab Results: CBC W/Diff    Component Value Date/Time   WBC 9.1 10/19/2016 1150   WBC 7.6 04/30/2015 1335   RBC 5.86 (H) 10/19/2016 1150   RBC 5.08 04/30/2015 1335   HGB 13.5 04/30/2015 1335   HGB 12.0 (L) 01/05/2014 1035   HCT 45.5 10/19/2016 1150   HCT 37.1 (L) 01/05/2014 1035   PLT 261 10/19/2016 1150   MCV 78 (L) 10/19/2016 1150   MCV 79.1 (L) 01/05/2014 1035   MCH 26.1 (L) 10/19/2016 1150   MCH 26.6 04/30/2015 1335   MCHC 33.6 10/19/2016 1150   MCHC 32.7 04/30/2015 1335   RDW 15.3 10/19/2016 1150   RDW 16.8 (H) 01/05/2014 1035   LYMPHSABS 0.5 (L) 10/19/2016 1150   LYMPHSABS 0.7 (L) 01/05/2014 1035   MONOABS 0.2 04/30/2015 1335   MONOABS 0.3 01/05/2014 1035   EOSABS 0.1 10/19/2016 1150   BASOSABS 0.1 10/19/2016 1150   BASOSABS 0.1 01/05/2014 1035     Chemistry      Component Value Date/Time   NA 140 10/19/2016 1150   NA 141 01/05/2014 1035   K 4.3 10/19/2016 1150   K 3.9 01/05/2014 1035   CL 99 10/19/2016 1150   CL 106 03/07/2013 0826   CO2 24 10/19/2016 1150   CO2 24 01/05/2014 1035   BUN 8 10/19/2016 1150   BUN  11.3 01/05/2014 1035   CREATININE 1.18 10/19/2016 1150   CREATININE 1.21 04/30/2015 1335   CREATININE 1.8 (H) 01/05/2014 1035      Component Value Date/Time   CALCIUM 9.5 10/19/2016 1150   CALCIUM 9.5 01/05/2014 1035   ALKPHOS 90 10/19/2016 1150   ALKPHOS 57 01/05/2014 1035   AST 28 10/19/2016 1150   AST 21 01/05/2014 1035   ALT 47 (H) 10/19/2016 1150   ALT 31 01/05/2014 1035   BILITOT 0.3 10/19/2016 1150   BILITOT 0.49 01/05/2014 1035       Radiological Studies: No results found.  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 Blood counts and clinical exam stable on JAKOFI 20 mg twice daily. Continue to monitor blood counts monthly. Ultrasound of the spleen quarterly. CT scan annually. Bone marrow if any significant change in hematologic profile. Ongoing evaluation at [facility] with respect to allogeneic transplant. Follow-up visit at [facility] scheduled for later this month.  #2. Transient elevation  of creatinine up to 1.8 now back to his baseline of 1.2 and likely normal for his muscle mass.  #3. 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 He signed a pain contract with [place] at time of his last visit. He has agreed to tapering his Vicodin now down to 40 tablets monthly but he has been told to double dipping" as outlined above. I will try to alert other prescribers.  #4. Molluscum contagiosum versus simple verrucae skin of neck and scalp with near complete response to topical antiviral cream.  #5. Erectile dysfunction. He has had no problems fathering 6 children! He was recently put on a  trial of Viagra 50 mg as needed.  CC: Patient Care Team: Riccardo Dubin, MD as PCP - General (Internal Medicine) [name], MD as Referring Physician (Internal Medicine)   [name], MD 1/16/201810:12 AM

## 2016-11-16 ENCOUNTER — Telehealth: Payer: Self-pay | Admitting: Internal Medicine

## 2016-11-16 NOTE — Telephone Encounter (Signed)
APT. REMINDER CALL, LMTCB °

## 2016-11-17 ENCOUNTER — Other Ambulatory Visit (INDEPENDENT_AMBULATORY_CARE_PROVIDER_SITE_OTHER): Payer: BLUE CROSS/BLUE SHIELD

## 2016-11-17 DIAGNOSIS — D471 Chronic myeloproliferative disease: Secondary | ICD-10-CM

## 2016-11-17 DIAGNOSIS — R161 Splenomegaly, not elsewhere classified: Secondary | ICD-10-CM | POA: Diagnosis not present

## 2016-11-17 DIAGNOSIS — F1120 Opioid dependence, uncomplicated: Secondary | ICD-10-CM

## 2016-11-18 LAB — CBC WITH DIFFERENTIAL/PLATELET
BASOS: 0 %
Basophils Absolute: 0 10*3/uL (ref 0.0–0.2)
EOS (ABSOLUTE): 0.1 10*3/uL (ref 0.0–0.4)
Eos: 1 %
Hematocrit: 43.1 % (ref 37.5–51.0)
Hemoglobin: 14.7 g/dL (ref 13.0–17.7)
Immature Grans (Abs): 0 10*3/uL (ref 0.0–0.1)
Immature Granulocytes: 0 %
LYMPHS: 5 %
Lymphocytes Absolute: 0.5 10*3/uL — ABNORMAL LOW (ref 0.7–3.1)
MCH: 26.2 pg — AB (ref 26.6–33.0)
MCHC: 34.1 g/dL (ref 31.5–35.7)
MCV: 77 fL — AB (ref 79–97)
Monocytes Absolute: 0.2 10*3/uL (ref 0.1–0.9)
Monocytes: 2 %
NEUTROS ABS: 8.7 10*3/uL — AB (ref 1.4–7.0)
NEUTROS PCT: 92 %
PLATELETS: 315 10*3/uL (ref 150–379)
RBC: 5.61 x10E6/uL (ref 4.14–5.80)
RDW: 15.4 % (ref 12.3–15.4)
WBC: 9.5 10*3/uL (ref 3.4–10.8)

## 2016-11-18 LAB — COMPREHENSIVE METABOLIC PANEL
A/G RATIO: 1.6 (ref 1.2–2.2)
ALK PHOS: 85 IU/L (ref 39–117)
ALT: 31 IU/L (ref 0–44)
AST: 22 IU/L (ref 0–40)
Albumin: 4.5 g/dL (ref 3.5–5.5)
BILIRUBIN TOTAL: 0.4 mg/dL (ref 0.0–1.2)
BUN/Creatinine Ratio: 9 (ref 9–20)
BUN: 10 mg/dL (ref 6–24)
CHLORIDE: 103 mmol/L (ref 96–106)
CO2: 18 mmol/L (ref 18–29)
Calcium: 9.2 mg/dL (ref 8.7–10.2)
Creatinine, Ser: 1.13 mg/dL (ref 0.76–1.27)
GFR calc Af Amer: 91 mL/min/{1.73_m2} (ref >59)
GFR calc non Af Amer: 79 mL/min/{1.73_m2} (ref >59)
GLUCOSE: 97 mg/dL (ref 65–99)
Globulin, Total: 2.8 g/dL (ref 1.5–4.5)
POTASSIUM: 3.9 mmol/L (ref 3.5–5.2)
Sodium: 141 mmol/L (ref 134–144)
Total Protein: 7.3 g/dL (ref 6.0–8.5)

## 2016-11-18 LAB — URIC ACID: URIC ACID: 6.7 mg/dL (ref 3.7–8.6)

## 2016-11-18 LAB — LACTATE DEHYDROGENASE: LDH: 387 IU/L — AB (ref 121–224)

## 2016-11-20 ENCOUNTER — Other Ambulatory Visit: Payer: Self-pay

## 2016-11-20 NOTE — Telephone Encounter (Signed)
oxyCODONE-acetaminophen (PERCOCET) 10-325 MG tablet, refill request.  

## 2016-11-21 ENCOUNTER — Other Ambulatory Visit: Payer: Self-pay | Admitting: Oncology

## 2016-11-21 MED ORDER — OXYCODONE-ACETAMINOPHEN 10-325 MG PO TABS: 1.0000 | ORAL_TABLET | Freq: Four times a day (QID) | ORAL | 0 refills | Status: DC | PRN

## 2016-11-21 NOTE — Telephone Encounter (Signed)
Percocet was refilled per Dr Beryle Beams - pt called / nformed rx is ready.

## 2016-12-14 ENCOUNTER — Telehealth: Payer: Self-pay | Admitting: Internal Medicine

## 2016-12-14 NOTE — Telephone Encounter (Signed)
APT. REMINDER CALL, LMTCB °

## 2016-12-15 ENCOUNTER — Other Ambulatory Visit: Payer: BLUE CROSS/BLUE SHIELD

## 2016-12-19 ENCOUNTER — Other Ambulatory Visit: Payer: Self-pay

## 2016-12-19 NOTE — Telephone Encounter (Signed)
Requesting percocet to be filled.  

## 2016-12-20 NOTE — Telephone Encounter (Signed)
Dr. Darnell Level will take care of this prescription and all future narcotic medications since he initiated the pain contract. Please route the refills to him only.

## 2016-12-21 ENCOUNTER — Telehealth: Payer: Self-pay | Admitting: Internal Medicine

## 2016-12-21 MED ORDER — OXYCODONE-ACETAMINOPHEN 10-325 MG PO TABS: 1.0000 | ORAL_TABLET | Freq: Four times a day (QID) | ORAL | 0 refills | Status: DC | PRN

## 2016-12-21 NOTE — Telephone Encounter (Signed)
Rx ready - called pt; no answer, left message.

## 2016-12-21 NOTE — Telephone Encounter (Signed)
APT. REMINDER CALL, LMTCB °

## 2016-12-22 ENCOUNTER — Other Ambulatory Visit (INDEPENDENT_AMBULATORY_CARE_PROVIDER_SITE_OTHER): Payer: BLUE CROSS/BLUE SHIELD

## 2016-12-22 DIAGNOSIS — F1120 Opioid dependence, uncomplicated: Secondary | ICD-10-CM

## 2016-12-22 DIAGNOSIS — D471 Chronic myeloproliferative disease: Secondary | ICD-10-CM

## 2016-12-22 DIAGNOSIS — R161 Splenomegaly, not elsewhere classified: Secondary | ICD-10-CM

## 2016-12-23 LAB — COMPREHENSIVE METABOLIC PANEL
A/G RATIO: 1.7 (ref 1.2–2.2)
ALT: 28 IU/L (ref 0–44)
AST: 23 IU/L (ref 0–40)
Albumin: 4.6 g/dL (ref 3.5–5.5)
Alkaline Phosphatase: 76 IU/L (ref 39–117)
BUN/Creatinine Ratio: 10 (ref 9–20)
BUN: 10 mg/dL (ref 6–24)
Bilirubin Total: 0.3 mg/dL (ref 0.0–1.2)
CALCIUM: 9.3 mg/dL (ref 8.7–10.2)
CO2: 20 mmol/L (ref 18–29)
Chloride: 99 mmol/L (ref 96–106)
Creatinine, Ser: 1.04 mg/dL (ref 0.76–1.27)
GFR, EST AFRICAN AMERICAN: 100 mL/min/{1.73_m2} (ref >59)
GFR, EST NON AFRICAN AMERICAN: 87 mL/min/{1.73_m2} (ref >59)
GLOBULIN, TOTAL: 2.7 g/dL (ref 1.5–4.5)
Glucose: 95 mg/dL (ref 65–99)
POTASSIUM: 4.2 mmol/L (ref 3.5–5.2)
SODIUM: 139 mmol/L (ref 134–144)
TOTAL PROTEIN: 7.3 g/dL (ref 6.0–8.5)

## 2016-12-23 LAB — CBC WITH DIFFERENTIAL/PLATELET
BASOS: 1 %
Basophils Absolute: 0.1 10*3/uL (ref 0.0–0.2)
EOS (ABSOLUTE): 0.1 10*3/uL (ref 0.0–0.4)
EOS: 1 %
HEMATOCRIT: 41.8 % (ref 37.5–51.0)
Hemoglobin: 14.5 g/dL (ref 13.0–17.7)
IMMATURE GRANS (ABS): 0 10*3/uL (ref 0.0–0.1)
IMMATURE GRANULOCYTES: 1 %
LYMPHS: 7 %
Lymphocytes Absolute: 0.6 10*3/uL — ABNORMAL LOW (ref 0.7–3.1)
MCH: 26.9 pg (ref 26.6–33.0)
MCHC: 34.7 g/dL (ref 31.5–35.7)
MCV: 78 fL — AB (ref 79–97)
MONOCYTES: 3 %
Monocytes Absolute: 0.2 10*3/uL (ref 0.1–0.9)
NEUTROS PCT: 87 %
Neutrophils Absolute: 7.4 10*3/uL — ABNORMAL HIGH (ref 1.4–7.0)
Platelets: 271 10*3/uL (ref 150–379)
RBC: 5.39 x10E6/uL (ref 4.14–5.80)
RDW: 15.7 % — ABNORMAL HIGH (ref 12.3–15.4)
WBC: 8.4 10*3/uL (ref 3.4–10.8)

## 2016-12-23 LAB — URIC ACID: Uric Acid: 6.5 mg/dL (ref 3.7–8.6)

## 2016-12-23 LAB — LACTATE DEHYDROGENASE: LDH: 371 IU/L — ABNORMAL HIGH (ref 121–224)

## 2016-12-25 ENCOUNTER — Telehealth: Payer: Self-pay | Admitting: *Deleted

## 2016-12-25 NOTE — Telephone Encounter (Signed)
Pt called - no answer; left message "lab good" per Dr Granfortuna. And call for any questions. 

## 2016-12-26 ENCOUNTER — Encounter: Payer: Self-pay | Admitting: Internal Medicine

## 2017-01-12 ENCOUNTER — Telehealth: Payer: Self-pay | Admitting: Internal Medicine

## 2017-01-12 NOTE — Telephone Encounter (Signed)
APT. REMINDER CALL, LMTCB °

## 2017-01-15 ENCOUNTER — Other Ambulatory Visit: Payer: Self-pay

## 2017-01-15 ENCOUNTER — Other Ambulatory Visit: Payer: BLUE CROSS/BLUE SHIELD

## 2017-01-15 MED ORDER — OXYCODONE-ACETAMINOPHEN 10-325 MG PO TABS: 1.0000 | ORAL_TABLET | Freq: Four times a day (QID) | ORAL | 0 refills | Status: DC | PRN

## 2017-01-15 NOTE — Telephone Encounter (Signed)
oxyCODONE-acetaminophen (PERCOCET) 10-325 MG tablet, refill request.  

## 2017-01-15 NOTE — Telephone Encounter (Signed)
Call made to patient-rx ready.Despina Hidden Cassady3/26/20184:42 PM

## 2017-01-16 ENCOUNTER — Other Ambulatory Visit (INDEPENDENT_AMBULATORY_CARE_PROVIDER_SITE_OTHER): Payer: BLUE CROSS/BLUE SHIELD

## 2017-01-16 DIAGNOSIS — D471 Chronic myeloproliferative disease: Secondary | ICD-10-CM

## 2017-01-16 DIAGNOSIS — F1120 Opioid dependence, uncomplicated: Secondary | ICD-10-CM

## 2017-01-16 DIAGNOSIS — R161 Splenomegaly, not elsewhere classified: Secondary | ICD-10-CM

## 2017-01-17 ENCOUNTER — Telehealth: Payer: Self-pay | Admitting: Internal Medicine

## 2017-01-17 LAB — COMPREHENSIVE METABOLIC PANEL
ALBUMIN: 4.5 g/dL (ref 3.5–5.5)
ALT: 33 IU/L (ref 0–44)
AST: 26 IU/L (ref 0–40)
Albumin/Globulin Ratio: 1.6 (ref 1.2–2.2)
Alkaline Phosphatase: 74 IU/L (ref 39–117)
BUN / CREAT RATIO: 10 (ref 9–20)
BUN: 11 mg/dL (ref 6–24)
Bilirubin Total: 0.5 mg/dL (ref 0.0–1.2)
CO2: 22 mmol/L (ref 18–29)
CREATININE: 1.05 mg/dL (ref 0.76–1.27)
Calcium: 9.1 mg/dL (ref 8.7–10.2)
Chloride: 102 mmol/L (ref 96–106)
GFR, EST AFRICAN AMERICAN: 99 mL/min/{1.73_m2} (ref >59)
GFR, EST NON AFRICAN AMERICAN: 86 mL/min/{1.73_m2} (ref >59)
GLOBULIN, TOTAL: 2.9 g/dL (ref 1.5–4.5)
GLUCOSE: 102 mg/dL — AB (ref 65–99)
Potassium: 4.4 mmol/L (ref 3.5–5.2)
SODIUM: 140 mmol/L (ref 134–144)
Total Protein: 7.4 g/dL (ref 6.0–8.5)

## 2017-01-17 LAB — CBC WITH DIFFERENTIAL/PLATELET
BASOS ABS: 0 10*3/uL (ref 0.0–0.2)
Basos: 1 %
EOS (ABSOLUTE): 0.1 10*3/uL (ref 0.0–0.4)
Eos: 1 %
HEMOGLOBIN: 14.2 g/dL (ref 13.0–17.7)
Hematocrit: 41.1 % (ref 37.5–51.0)
Immature Grans (Abs): 0 10*3/uL (ref 0.0–0.1)
Immature Granulocytes: 0 %
LYMPHS ABS: 0.5 10*3/uL — AB (ref 0.7–3.1)
Lymphs: 8 %
MCH: 26.9 pg (ref 26.6–33.0)
MCHC: 34.5 g/dL (ref 31.5–35.7)
MCV: 78 fL — ABNORMAL LOW (ref 79–97)
MONOCYTES: 4 %
Monocytes Absolute: 0.2 10*3/uL (ref 0.1–0.9)
Neutrophils Absolute: 5.3 10*3/uL (ref 1.4–7.0)
Neutrophils: 86 %
PLATELETS: 256 10*3/uL (ref 150–379)
RBC: 5.28 x10E6/uL (ref 4.14–5.80)
RDW: 15.5 % — AB (ref 12.3–15.4)
WBC: 6.1 10*3/uL (ref 3.4–10.8)

## 2017-01-17 LAB — LACTATE DEHYDROGENASE: LDH: 325 IU/L — ABNORMAL HIGH (ref 121–224)

## 2017-01-17 LAB — URIC ACID: Uric Acid: 6.9 mg/dL (ref 3.7–8.6)

## 2017-01-17 NOTE — Telephone Encounter (Signed)
Questions about meds please call

## 2017-01-17 NOTE — Telephone Encounter (Signed)
I called the ph# back, the rep said she could not help me because she could not find anything in their system for pt

## 2017-01-18 ENCOUNTER — Other Ambulatory Visit: Payer: BLUE CROSS/BLUE SHIELD

## 2017-01-18 ENCOUNTER — Telehealth: Payer: Self-pay | Admitting: *Deleted

## 2017-01-18 NOTE — Telephone Encounter (Signed)
Pt called - no answer; left message "lab good" per Dr Beryle Beams. And call for any questions.

## 2017-01-18 NOTE — Telephone Encounter (Signed)
-----   Message from [name], MD sent at 01/17/2017  8:46 AM EDT ----- Call pt: lab good

## 2017-01-24 ENCOUNTER — Telehealth: Payer: Self-pay | Admitting: Oncology

## 2017-01-24 NOTE — Telephone Encounter (Signed)
Please call patient.  States he has been trying to get in contact with you since Monday.

## 2017-01-24 NOTE — Telephone Encounter (Signed)
Returned pt's call - stated he noticed over the w/e Percocet was 5 mg instead of 10 mg realizing after 5 mg was not controlling his pain. Instructed pt to take [facility] tabs [facility]. And I will give them a call.  [facility] called [facility] - talked to [place] pharmacist, explained what had happened. Stated error was made by one of the pharmacist; pt needs to bring remaining tabs /bottle to them and they will correct the error. "We will fix him right on up".  Called pt back - explained the above -he plans to [facility] to [facility] after he gets off work.

## 2017-01-30 ENCOUNTER — Telehealth: Payer: Self-pay

## 2017-01-30 NOTE — Telephone Encounter (Signed)
Thank you for bringing this to my attention Margarent. Dr. Darnell Level who last saw him in January and refilled his medication. Per records in our chart, it is the right pharmacy so unsure why he isn't taking his medication. I will try to reach him at a reasonable hour now that I start night shift.

## 2017-01-30 NOTE — Telephone Encounter (Signed)
[facility] called to see if patient is still actively taking Shanon Brow has not refilled since 11/2016 they have not been able to reach him by phone goes to voice mail. Attempted to call patient no answer

## 2017-02-09 ENCOUNTER — Other Ambulatory Visit: Payer: Self-pay

## 2017-02-09 NOTE — Telephone Encounter (Signed)
oxyCODONE-acetaminophen (PERCOCET) 10-325 MG tablet, refill request.  

## 2017-02-12 MED ORDER — OXYCODONE-ACETAMINOPHEN 10-325 MG PO TABS: 1.0000 | ORAL_TABLET | Freq: Four times a day (QID) | ORAL | 0 refills | Status: DC | PRN

## 2017-02-13 NOTE — Telephone Encounter (Signed)
Rx ready - pt called/informed. 

## 2017-02-15 ENCOUNTER — Telehealth: Payer: Self-pay | Admitting: Internal Medicine

## 2017-02-15 NOTE — Telephone Encounter (Signed)
APT. REMINDER CALL, LMTCB °

## 2017-02-16 ENCOUNTER — Encounter (INDEPENDENT_AMBULATORY_CARE_PROVIDER_SITE_OTHER): Payer: Self-pay

## 2017-02-16 ENCOUNTER — Other Ambulatory Visit (INDEPENDENT_AMBULATORY_CARE_PROVIDER_SITE_OTHER): Payer: BLUE CROSS/BLUE SHIELD

## 2017-02-16 DIAGNOSIS — R161 Splenomegaly, not elsewhere classified: Secondary | ICD-10-CM

## 2017-02-16 DIAGNOSIS — F1120 Opioid dependence, uncomplicated: Secondary | ICD-10-CM

## 2017-02-16 DIAGNOSIS — D471 Chronic myeloproliferative disease: Secondary | ICD-10-CM | POA: Diagnosis not present

## 2017-02-17 LAB — CBC WITH DIFFERENTIAL/PLATELET
BASOS: 0 %
Basophils Absolute: 0 10*3/uL (ref 0.0–0.2)
EOS (ABSOLUTE): 0.1 10*3/uL (ref 0.0–0.4)
Eos: 1 %
HEMOGLOBIN: 14.6 g/dL (ref 13.0–17.7)
Hematocrit: 43.8 % (ref 37.5–51.0)
IMMATURE GRANS (ABS): 0 10*3/uL (ref 0.0–0.1)
IMMATURE GRANULOCYTES: 0 %
LYMPHS: 4 %
Lymphocytes Absolute: 0.4 10*3/uL — ABNORMAL LOW (ref 0.7–3.1)
MCH: 26.4 pg — ABNORMAL LOW (ref 26.6–33.0)
MCHC: 33.3 g/dL (ref 31.5–35.7)
MCV: 79 fL (ref 79–97)
MONOCYTES: 2 %
Monocytes Absolute: 0.2 10*3/uL (ref 0.1–0.9)
NEUTROS PCT: 93 %
Neutrophils Absolute: 7.9 10*3/uL — ABNORMAL HIGH (ref 1.4–7.0)
PLATELETS: 259 10*3/uL (ref 150–379)
RBC: 5.54 x10E6/uL (ref 4.14–5.80)
RDW: 14.8 % (ref 12.3–15.4)
WBC: 8.6 10*3/uL (ref 3.4–10.8)

## 2017-02-17 LAB — COMPREHENSIVE METABOLIC PANEL
ALBUMIN: 4.7 g/dL (ref 3.5–5.5)
ALT: 29 IU/L (ref 0–44)
AST: 21 IU/L (ref 0–40)
Albumin/Globulin Ratio: 1.7 (ref 1.2–2.2)
Alkaline Phosphatase: 73 IU/L (ref 39–117)
BUN/Creatinine Ratio: 8 — ABNORMAL LOW (ref 9–20)
BUN: 10 mg/dL (ref 6–24)
Bilirubin Total: 0.5 mg/dL (ref 0.0–1.2)
CALCIUM: 9 mg/dL (ref 8.7–10.2)
CO2: 20 mmol/L (ref 18–29)
Chloride: 99 mmol/L (ref 96–106)
Creatinine, Ser: 1.2 mg/dL (ref 0.76–1.27)
GFR, EST AFRICAN AMERICAN: 84 mL/min/{1.73_m2} (ref >59)
GFR, EST NON AFRICAN AMERICAN: 73 mL/min/{1.73_m2} (ref >59)
GLUCOSE: 83 mg/dL (ref 65–99)
Globulin, Total: 2.8 g/dL (ref 1.5–4.5)
Potassium: 4.2 mmol/L (ref 3.5–5.2)
Sodium: 139 mmol/L (ref 134–144)
TOTAL PROTEIN: 7.5 g/dL (ref 6.0–8.5)

## 2017-02-17 LAB — LACTATE DEHYDROGENASE: LDH: 329 IU/L — ABNORMAL HIGH (ref 121–224)

## 2017-02-17 LAB — URIC ACID: Uric Acid: 7.1 mg/dL (ref 3.7–8.6)

## 2017-02-19 ENCOUNTER — Telehealth: Payer: Self-pay | Admitting: *Deleted

## 2017-02-19 ENCOUNTER — Other Ambulatory Visit: Payer: Self-pay | Admitting: Oncology

## 2017-02-19 DIAGNOSIS — C946 Myelodysplastic disease, not classified: Secondary | ICD-10-CM

## 2017-02-19 DIAGNOSIS — R161 Splenomegaly, not elsewhere classified: Secondary | ICD-10-CM

## 2017-02-19 DIAGNOSIS — D47Z9 Other specified neoplasms of uncertain behavior of lymphoid, hematopoietic and related tissue: Secondary | ICD-10-CM

## 2017-02-19 MED ORDER — OXYCODONE-ACETAMINOPHEN 10-325 MG PO TABS
1.0000 | ORAL_TABLET | Freq: Four times a day (QID) | ORAL | 0 refills | Status: DC | PRN
Start: 2017-02-19 — End: 2017-03-16

## 2017-02-19 NOTE — Telephone Encounter (Signed)
I will increase to 60 pills/month. Schedule CT of spleen Keep June appointment Continue monthly labs

## 2017-02-19 NOTE — Telephone Encounter (Signed)
Call from pt - requesting increase qty of Percocet. States he's getting #30 tabs, which he takes 1 a day and this does not control his pain when he's working at [employer]. Wants to know if he needs to schedule an appt? Thanks

## 2017-02-19 NOTE — Telephone Encounter (Signed)
-----   Message from [name], MD sent at 02/19/2017 11:04 AM EDT ----- Call pt: lab all OK

## 2017-02-19 NOTE — Telephone Encounter (Signed)
Pt informed "lab all OK" per Dr Sanjuana Letters.

## 2017-02-20 NOTE — Telephone Encounter (Signed)
Called pt - no answer; left message Percocet rx ready to be pick up , CT of spleen has been ordered (Chilon will call w/appt) and keep June appt with Dr Darnell Level.

## 2017-02-20 NOTE — Progress Notes (Signed)
Sure thing!

## 2017-03-06 ENCOUNTER — Telehealth: Payer: Self-pay | Admitting: Internal Medicine

## 2017-03-16 ENCOUNTER — Other Ambulatory Visit: Payer: BLUE CROSS/BLUE SHIELD

## 2017-03-16 ENCOUNTER — Other Ambulatory Visit: Payer: Self-pay

## 2017-03-16 DIAGNOSIS — R161 Splenomegaly, not elsewhere classified: Secondary | ICD-10-CM

## 2017-03-16 DIAGNOSIS — C946 Myelodysplastic disease, not classified: Secondary | ICD-10-CM

## 2017-03-16 DIAGNOSIS — D47Z9 Other specified neoplasms of uncertain behavior of lymphoid, hematopoietic and related tissue: Secondary | ICD-10-CM

## 2017-03-16 MED ORDER — OXYCODONE-ACETAMINOPHEN 10-325 MG PO TABS: 1.0000 | ORAL_TABLET | Freq: Four times a day (QID) | ORAL | 0 refills | Status: DC | PRN

## 2017-03-16 NOTE — Telephone Encounter (Signed)
Oxycodone rx ready - pt called / informed.

## 2017-03-16 NOTE — Telephone Encounter (Signed)
oxyCODONE-acetaminophen (PERCOCET) 10-325 MG tablet, refill request.  

## 2017-03-20 ENCOUNTER — Ambulatory Visit: Payer: BLUE CROSS/BLUE SHIELD | Admitting: Oncology

## 2017-03-26 ENCOUNTER — Encounter: Payer: Self-pay | Admitting: *Deleted

## 2017-04-03 ENCOUNTER — Ambulatory Visit (INDEPENDENT_AMBULATORY_CARE_PROVIDER_SITE_OTHER): Payer: BLUE CROSS/BLUE SHIELD | Admitting: Oncology

## 2017-04-03 ENCOUNTER — Encounter: Payer: Self-pay | Admitting: Oncology

## 2017-04-03 VITALS — BP 147/93 | HR 92 | Temp 98.2°F | Ht 72.0 in | Wt 214.6 lb

## 2017-04-03 DIAGNOSIS — Z79891 Long term (current) use of opiate analgesic: Secondary | ICD-10-CM | POA: Diagnosis not present

## 2017-04-03 DIAGNOSIS — Z872 Personal history of diseases of the skin and subcutaneous tissue: Secondary | ICD-10-CM | POA: Diagnosis not present

## 2017-04-03 DIAGNOSIS — R161 Splenomegaly, not elsewhere classified: Secondary | ICD-10-CM | POA: Diagnosis not present

## 2017-04-03 DIAGNOSIS — D471 Chronic myeloproliferative disease: Secondary | ICD-10-CM

## 2017-04-03 DIAGNOSIS — Z886 Allergy status to analgesic agent status: Secondary | ICD-10-CM

## 2017-04-03 DIAGNOSIS — C946 Myelodysplastic disease, not classified: Secondary | ICD-10-CM

## 2017-04-03 DIAGNOSIS — D47Z9 Other specified neoplasms of uncertain behavior of lymphoid, hematopoietic and related tissue: Secondary | ICD-10-CM

## 2017-04-03 NOTE — Patient Instructions (Signed)
To lab today then return every 2 months for labs MD visit 4-5 months

## 2017-04-03 NOTE — Progress Notes (Signed)
Hematology and Oncology Follow Up Visit  Sean Walter [id number] November 18, 1970 46 y.o. 04/03/2017 9:40 AM   Principle Diagnosis: Encounter Diagnoses  Name Primary?  . Spleen enlargement Yes  . Unclassifiable myelodysplastic/myeloproliferative neoplasm [facility])   Clinical Summary: 46 year old man who was initially called myelofibrosis but who more likely has a non-BCR-ABL myeloproliferative disorder. He presented with leukocytosis and massive splenomegaly in January 2012. Bone marrow biopsy done in 11/16/10 was hypercellular with prominent proliferation of megakaryocytes many of which had abnormal morphology. Collagen and reticulin stains were focally positive. There were no excess blasts; in fact, on a 500 cell count differential, 0% blasts were recorded. Routine cytogenetics were normal but FISH probes were not done. Peripheral blood was negative for the presence of BCR-ABL transcripts. He isJAK-2 gene mutation positive. (03/07/13) The patient was started on JAKOFI in February, 2012. Current dose is 20 mg twice daily. He has had doses as high as 25 mg twice daily.  I have followed him since July of 2013. Blood counts initially stable and there was a significant decrease in his spleen size although he continued to complain of pain and requests  Percocet on a regular basis.  He developed cumulative myelotoxicity requiring a dose reduction from 25 mg twice daily down to 20 mg twice daily in late August 2015. There was a discrepancy in my notes where I said I was decreasing him to 50 mg twice daily in August 2015. Itcame to my attention that the patient was actually only taking 10 mg twice daily. It is no surprise that his blood counts completely recovered but also that his spleen started to enlarge again at time; ultrasound done on 01/08/2015 with spleen dimensions 17 x 6 x 17 cm and volume 1189 mL cubed. I wrote a new prescription for 20 mg twice daily which he started taking on 01/31/2015.  Blood counts stabilized. Ultrasound of the spleen done on 07/08/2015 showed spleen size mildly decreased compared with the March study now measuring 16 x 5 x 17 cm. most recent ultrasound done 06/30/2016: Spleen 18.3 x 16.2 x 4.9 cm. 758 mm.  Interim History:   Overall doing well. He is having some domestic problems. His wife wants to move back to [place] with the children. He has no desire to do this. He continues to work full-time. I have been able to reduce his monthly dose of Tylox He continues to have intermittent left upper quadrant pain.  No other interim medical problems. No infections. He is tolerating JAKOFI well at current dose. His CBC is spectacularly normal.   Allergies:  Allergies  Allergen Reactions  . Ibuprofen Other (See Comments)    Indigestion Indigestion    Review of Systems: See interim history Remaining ROS negative:   Physical Exam: Blood pressure (!) 147/93, pulse 92, temperature 98.2 F (36.8 C), temperature source Oral, height 6' (1.829 m), weight 214 lb 9.6 oz (97.3 kg), SpO2 100 %. Wt Readings from Last 3 Encounters:  04/03/17 214 lb 9.6 oz (97.3 kg)  11/07/16 220 lb 12.8 oz (100.2 kg)  06/05/16 215 lb 12.8 oz (97.9 kg)     General appearance: muscular AA man HENNT: Pharynx no erythema, exudate, mass, or ulcer. No thyromegaly or thyroid nodules Lymph nodes: No cervical, supraclavicular, or axillary lymphadenopathy Breasts:  Lungs: Clear to auscultation, resonant to percussion throughout Heart: Regular rhythm, no murmur, no gallop, no rub, no click, no edema Abdomen: Soft, nontender, normal bowel sounds, no mass, no organomegaly Despite significant splenomegaly on imaging  Extremities: No edema, no calf tenderness Musculoskeletal: no joint deformities GU:  Vascular: Carotid pulses 2+, no bruits,  Neurologic: Alert, oriented, PERRLA,, cranial nerves grossly normal, motor strength 5 over 5, reflexes 1+ symmetric, upper body coordination normal, gait  normal, Skin: No rash or ecchymosis; multiple tattoos  Lab Results: CBC W/Diff    Component Value Date/Time   WBC 8.6 02/16/2017 1011   WBC 7.6 04/30/2015 1335   RBC 5.54 02/16/2017 1011   RBC 5.08 04/30/2015 1335   HGB 14.6 02/16/2017 1011   HGB 12.0 (L) 01/05/2014 1035   HCT 43.8 02/16/2017 1011   HCT 37.1 (L) 01/05/2014 1035   PLT 259 02/16/2017 1011   MCV 79 02/16/2017 1011   MCV 79.1 (L) 01/05/2014 1035   MCH 26.4 (L) 02/16/2017 1011   MCH 26.6 04/30/2015 1335   MCHC 33.3 02/16/2017 1011   MCHC 32.7 04/30/2015 1335   RDW 14.8 02/16/2017 1011   RDW 16.8 (H) 01/05/2014 1035   LYMPHSABS 0.4 (L) 02/16/2017 1011   LYMPHSABS 0.7 (L) 01/05/2014 1035   MONOABS 0.2 04/30/2015 1335   MONOABS 0.3 01/05/2014 1035   EOSABS 0.1 02/16/2017 1011   BASOSABS 0.0 02/16/2017 1011   BASOSABS 0.1 01/05/2014 1035     Chemistry      Component Value Date/Time   NA 139 02/16/2017 1011   NA 141 01/05/2014 1035   K 4.2 02/16/2017 1011   K 3.9 01/05/2014 1035   CL 99 02/16/2017 1011   CL 106 03/07/2013 0826   CO2 20 02/16/2017 1011   CO2 24 01/05/2014 1035   BUN 10 02/16/2017 1011   BUN 11.3 01/05/2014 1035   CREATININE 1.20 02/16/2017 1011   CREATININE 1.21 04/30/2015 1335   CREATININE 1.8 (H) 01/05/2014 1035      Component Value Date/Time   CALCIUM 9.0 02/16/2017 1011   CALCIUM 9.5 01/05/2014 1035   ALKPHOS 73 02/16/2017 1011   ALKPHOS 57 01/05/2014 1035   AST 21 02/16/2017 1011   AST 21 01/05/2014 1035   ALT 29 02/16/2017 1011   ALT 31 01/05/2014 1035   BILITOT 0.5 02/16/2017 1011   BILITOT 0.49 01/05/2014 1035       Radiological Studies: CT scan of the abdomen done through [facility] on 03/16/2017 shows stable spleen size of 18 cm. This compares well with an ultrasound done here in September 2017-18.3 cm, and most recent CT scan done on 12/30/2013 of 17 cm.   Impression:  #1.JAK-2 positive myeloproliferative disorder  Stable splenomegaly and normal hematologic  profile on JAKOFI for over 6 years. Plan: Continue the same  #2. Molluscum contagiosum versus simple verrucae complete response to topical antiviral cream  #3. Habituation to Vicodin. Now on minimal monthly dose.  CC: Patient Care Team: Riccardo Dubin, MD as PCP - General (Internal Medicine) [name], MD as Referring Physician (Internal Medicine)   Murriel Hopper, MD, [place]  Hematology-Oncology/Internal Medicine     6/12/20189:40 AM

## 2017-04-04 LAB — COMPREHENSIVE METABOLIC PANEL
ALK PHOS: 84 IU/L (ref 39–117)
ALT: 26 IU/L (ref 0–44)
AST: 23 IU/L (ref 0–40)
Albumin/Globulin Ratio: 1.6 (ref 1.2–2.2)
Albumin: 4.5 g/dL (ref 3.5–5.5)
BUN/Creatinine Ratio: 6 — ABNORMAL LOW (ref 9–20)
BUN: 8 mg/dL (ref 6–24)
Bilirubin Total: 0.4 mg/dL (ref 0.0–1.2)
CO2: 23 mmol/L (ref 20–29)
CREATININE: 1.28 mg/dL — AB (ref 0.76–1.27)
Calcium: 9.1 mg/dL (ref 8.7–10.2)
Chloride: 100 mmol/L (ref 96–106)
GFR calc Af Amer: 78 mL/min/{1.73_m2} (ref >59)
GFR calc non Af Amer: 67 mL/min/{1.73_m2} (ref >59)
GLUCOSE: 107 mg/dL — AB (ref 65–99)
Globulin, Total: 2.8 g/dL (ref 1.5–4.5)
Potassium: 4 mmol/L (ref 3.5–5.2)
Sodium: 138 mmol/L (ref 134–144)
Total Protein: 7.3 g/dL (ref 6.0–8.5)

## 2017-04-04 LAB — CBC WITH DIFFERENTIAL/PLATELET
BASOS ABS: 0 10*3/uL (ref 0.0–0.2)
Basos: 1 %
EOS (ABSOLUTE): 0.1 10*3/uL (ref 0.0–0.4)
Eos: 2 %
HEMOGLOBIN: 14.1 g/dL (ref 13.0–17.7)
Hematocrit: 42.9 % (ref 37.5–51.0)
Immature Grans (Abs): 0 10*3/uL (ref 0.0–0.1)
Immature Granulocytes: 0 %
LYMPHS ABS: 0.4 10*3/uL — AB (ref 0.7–3.1)
Lymphs: 6 %
MCH: 26.8 pg (ref 26.6–33.0)
MCHC: 32.9 g/dL (ref 31.5–35.7)
MCV: 81 fL (ref 79–97)
MONOCYTES: 3 %
Monocytes Absolute: 0.2 10*3/uL (ref 0.1–0.9)
Neutrophils Absolute: 6.6 10*3/uL (ref 1.4–7.0)
Neutrophils: 88 %
PLATELETS: 243 10*3/uL (ref 150–379)
RBC: 5.27 x10E6/uL (ref 4.14–5.80)
RDW: 14.9 % (ref 12.3–15.4)
WBC: 7.4 10*3/uL (ref 3.4–10.8)

## 2017-04-04 LAB — URIC ACID: URIC ACID: 6.1 mg/dL (ref 3.7–8.6)

## 2017-04-04 LAB — LACTATE DEHYDROGENASE: LDH: 311 IU/L — AB (ref 121–224)

## 2017-04-12 ENCOUNTER — Other Ambulatory Visit: Payer: Self-pay

## 2017-04-12 DIAGNOSIS — D47Z9 Other specified neoplasms of uncertain behavior of lymphoid, hematopoietic and related tissue: Secondary | ICD-10-CM

## 2017-04-12 DIAGNOSIS — C946 Myelodysplastic disease, not classified: Secondary | ICD-10-CM

## 2017-04-12 DIAGNOSIS — R161 Splenomegaly, not elsewhere classified: Secondary | ICD-10-CM

## 2017-04-12 NOTE — Telephone Encounter (Signed)
oxyCODONE-acetaminophen (PERCOCET) 10-325 MG tablet, refill request.  

## 2017-04-16 ENCOUNTER — Other Ambulatory Visit (INDEPENDENT_AMBULATORY_CARE_PROVIDER_SITE_OTHER): Payer: BLUE CROSS/BLUE SHIELD

## 2017-04-16 DIAGNOSIS — R161 Splenomegaly, not elsewhere classified: Secondary | ICD-10-CM | POA: Diagnosis not present

## 2017-04-16 DIAGNOSIS — C946 Myelodysplastic disease, not classified: Secondary | ICD-10-CM

## 2017-04-16 DIAGNOSIS — D47Z9 Other specified neoplasms of uncertain behavior of lymphoid, hematopoietic and related tissue: Secondary | ICD-10-CM

## 2017-04-16 MED ORDER — OXYCODONE-ACETAMINOPHEN 10-325 MG PO TABS: 1.0000 | ORAL_TABLET | Freq: Four times a day (QID) | ORAL | 0 refills | Status: DC | PRN

## 2017-04-16 NOTE — Telephone Encounter (Signed)
Would like to know if Rx is ready for pick up. Please call pt back.  

## 2017-04-16 NOTE — Telephone Encounter (Signed)
Pt picked up script.Despina Hidden Cassady6/25/20183:02 PM

## 2017-04-17 ENCOUNTER — Telehealth: Payer: Self-pay

## 2017-04-17 LAB — COMPREHENSIVE METABOLIC PANEL
ALK PHOS: 74 IU/L (ref 39–117)
ALT: 25 IU/L (ref 0–44)
AST: 21 IU/L (ref 0–40)
Albumin/Globulin Ratio: 1.9 (ref 1.2–2.2)
Albumin: 4.4 g/dL (ref 3.5–5.5)
BILIRUBIN TOTAL: 0.5 mg/dL (ref 0.0–1.2)
BUN/Creatinine Ratio: 8 — ABNORMAL LOW (ref 9–20)
BUN: 10 mg/dL (ref 6–24)
CHLORIDE: 99 mmol/L (ref 96–106)
CO2: 23 mmol/L (ref 20–29)
CREATININE: 1.33 mg/dL — AB (ref 0.76–1.27)
Calcium: 9 mg/dL (ref 8.7–10.2)
GFR calc non Af Amer: 64 mL/min/{1.73_m2} (ref >59)
GFR, EST AFRICAN AMERICAN: 74 mL/min/{1.73_m2} (ref >59)
GLUCOSE: 108 mg/dL — AB (ref 65–99)
Globulin, Total: 2.3 g/dL (ref 1.5–4.5)
Potassium: 3.9 mmol/L (ref 3.5–5.2)
Sodium: 139 mmol/L (ref 134–144)
TOTAL PROTEIN: 6.7 g/dL (ref 6.0–8.5)

## 2017-04-17 LAB — CBC WITH DIFFERENTIAL/PLATELET
BASOS ABS: 0 10*3/uL (ref 0.0–0.2)
Basos: 1 %
EOS (ABSOLUTE): 0.1 10*3/uL (ref 0.0–0.4)
Eos: 2 %
Hematocrit: 41.7 % (ref 37.5–51.0)
Hemoglobin: 13.7 g/dL (ref 13.0–17.7)
IMMATURE GRANS (ABS): 0 10*3/uL (ref 0.0–0.1)
Immature Granulocytes: 0 %
LYMPHS: 14 %
Lymphocytes Absolute: 0.8 10*3/uL (ref 0.7–3.1)
MCH: 26.7 pg (ref 26.6–33.0)
MCHC: 32.9 g/dL (ref 31.5–35.7)
MCV: 81 fL (ref 79–97)
MONOCYTES: 4 %
Monocytes Absolute: 0.2 10*3/uL (ref 0.1–0.9)
NEUTROS PCT: 79 %
Neutrophils Absolute: 4.5 10*3/uL (ref 1.4–7.0)
PLATELETS: 237 10*3/uL (ref 150–379)
RBC: 5.13 x10E6/uL (ref 4.14–5.80)
RDW: 15.2 % (ref 12.3–15.4)
WBC: 5.7 10*3/uL (ref 3.4–10.8)

## 2017-04-17 LAB — URIC ACID: Uric Acid: 7 mg/dL (ref 3.7–8.6)

## 2017-04-17 LAB — LACTATE DEHYDROGENASE: LDH: 264 IU/L — AB (ref 121–224)

## 2017-04-17 NOTE — Telephone Encounter (Signed)
Pharmacy called regarding patient requesting oxy to filled today per pharmacy last refill was 03/20/2017 he is due to refill 04/19/2017 per Dr. Beryle Beams do not fill early

## 2017-04-18 ENCOUNTER — Telehealth: Payer: Self-pay | Admitting: *Deleted

## 2017-04-18 NOTE — Telephone Encounter (Signed)
Called pt - no answer; left message "lab good. Stay on current dose of Jakofi" per Dr Beryle Beams. And call for any questions.

## 2017-04-18 NOTE — Telephone Encounter (Signed)
-----   Message from [name], MD sent at 04/17/2017  6:55 AM EDT ----- Call pt: lab good. Stay on current dose of [facility]

## 2017-05-14 ENCOUNTER — Other Ambulatory Visit: Payer: Self-pay

## 2017-05-14 DIAGNOSIS — D47Z9 Other specified neoplasms of uncertain behavior of lymphoid, hematopoietic and related tissue: Secondary | ICD-10-CM

## 2017-05-14 DIAGNOSIS — R161 Splenomegaly, not elsewhere classified: Secondary | ICD-10-CM

## 2017-05-14 DIAGNOSIS — C946 Myelodysplastic disease, not classified: Secondary | ICD-10-CM

## 2017-05-14 MED ORDER — OXYCODONE-ACETAMINOPHEN 10-325 MG PO TABS: 1.0000 | ORAL_TABLET | Freq: Four times a day (QID) | ORAL | 0 refills | Status: DC | PRN

## 2017-05-14 NOTE — Telephone Encounter (Signed)
oxyCODONE-acetaminophen (PERCOCET) 10-325 MG tablet, refill request.  

## 2017-05-14 NOTE — Telephone Encounter (Signed)
Rx ready for pick up; called pt-no answer, left message.

## 2017-06-08 ENCOUNTER — Other Ambulatory Visit: Payer: Self-pay | Admitting: Oncology

## 2017-06-08 ENCOUNTER — Telehealth: Payer: Self-pay

## 2017-06-08 ENCOUNTER — Other Ambulatory Visit: Payer: Self-pay | Admitting: *Deleted

## 2017-06-08 DIAGNOSIS — C946 Myelodysplastic disease, not classified: Secondary | ICD-10-CM

## 2017-06-08 DIAGNOSIS — R161 Splenomegaly, not elsewhere classified: Secondary | ICD-10-CM

## 2017-06-08 DIAGNOSIS — D47Z9 Other specified neoplasms of uncertain behavior of lymphoid, hematopoietic and related tissue: Secondary | ICD-10-CM

## 2017-06-08 MED ORDER — OXYCODONE-ACETAMINOPHEN 10-325 MG PO TABS: 1.0000 | ORAL_TABLET | Freq: Four times a day (QID) | ORAL | 0 refills | Status: DC | PRN

## 2017-06-08 NOTE — Telephone Encounter (Signed)
Dr Darnell Level has contract with pt to refill his oxycodone. Will forward. Before I knew it was Dr Darnell Level, I ran through [place]. 6/28 #60 5/29 $60 %/2 #60 4/24 #30

## 2017-06-08 NOTE — Telephone Encounter (Signed)
oxyCODONE-acetaminophen (PERCOCET) 10-325 MG tablet, refill request.  

## 2017-06-08 NOTE — Telephone Encounter (Signed)
Rx ready for pick up ; pt called / informed. 

## 2017-06-18 ENCOUNTER — Other Ambulatory Visit: Payer: BLUE CROSS/BLUE SHIELD

## 2017-06-22 ENCOUNTER — Other Ambulatory Visit: Payer: BLUE CROSS/BLUE SHIELD

## 2017-07-03 ENCOUNTER — Other Ambulatory Visit: Payer: BLUE CROSS/BLUE SHIELD

## 2017-07-05 ENCOUNTER — Other Ambulatory Visit: Payer: Self-pay | Admitting: *Deleted

## 2017-07-05 ENCOUNTER — Other Ambulatory Visit (INDEPENDENT_AMBULATORY_CARE_PROVIDER_SITE_OTHER): Payer: BLUE CROSS/BLUE SHIELD

## 2017-07-05 DIAGNOSIS — C946 Myelodysplastic disease, not classified: Secondary | ICD-10-CM

## 2017-07-05 DIAGNOSIS — R161 Splenomegaly, not elsewhere classified: Secondary | ICD-10-CM

## 2017-07-05 DIAGNOSIS — D47Z9 Other specified neoplasms of uncertain behavior of lymphoid, hematopoietic and related tissue: Secondary | ICD-10-CM

## 2017-07-05 MED ORDER — OXYCODONE-ACETAMINOPHEN 10-325 MG PO TABS: 1.0000 | ORAL_TABLET | Freq: Four times a day (QID) | ORAL | 0 refills | Status: DC | PRN

## 2017-07-05 NOTE — Telephone Encounter (Signed)
Last rx written 06/08/17. Last appt w/Dr Beryle Beams was6/12/18. Next appt w/PCP - 08/13/17. UDS - 12/01/15.

## 2017-07-05 NOTE — Telephone Encounter (Signed)
Rx ready for pick up ; pt called / informed.

## 2017-07-05 NOTE — Telephone Encounter (Signed)
Glenda - didn't we just fill this 1 week ago?

## 2017-07-05 NOTE — Telephone Encounter (Signed)
No, last refill was 8/17.

## 2017-07-06 LAB — CBC WITH DIFFERENTIAL/PLATELET
Basophils Absolute: 0.1 10*3/uL (ref 0.0–0.2)
Basos: 1 %
EOS (ABSOLUTE): 0.1 10*3/uL (ref 0.0–0.4)
EOS: 1 %
HEMATOCRIT: 47 % (ref 37.5–51.0)
HEMOGLOBIN: 15.1 g/dL (ref 13.0–17.7)
IMMATURE GRANULOCYTES: 0 %
Immature Grans (Abs): 0 10*3/uL (ref 0.0–0.1)
Lymphocytes Absolute: 0.8 10*3/uL (ref 0.7–3.1)
Lymphs: 6 %
MCH: 26.8 pg (ref 26.6–33.0)
MCHC: 32.1 g/dL (ref 31.5–35.7)
MCV: 83 fL (ref 79–97)
MONOCYTES: 3 %
Monocytes Absolute: 0.3 10*3/uL (ref 0.1–0.9)
NEUTROS PCT: 89 %
Neutrophils Absolute: 11.3 10*3/uL — ABNORMAL HIGH (ref 1.4–7.0)
Platelets: 342 10*3/uL (ref 150–379)
RBC: 5.64 x10E6/uL (ref 4.14–5.80)
RDW: 15.1 % (ref 12.3–15.4)
WBC: 12.6 10*3/uL — AB (ref 3.4–10.8)

## 2017-07-06 LAB — COMPREHENSIVE METABOLIC PANEL
ALBUMIN: 4.5 g/dL (ref 3.5–5.5)
ALT: 24 IU/L (ref 0–44)
AST: 22 IU/L (ref 0–40)
Albumin/Globulin Ratio: 1.5 (ref 1.2–2.2)
Alkaline Phosphatase: 88 IU/L (ref 39–117)
BUN / CREAT RATIO: 6 — AB (ref 9–20)
BUN: 7 mg/dL (ref 6–24)
Bilirubin Total: 0.3 mg/dL (ref 0.0–1.2)
CALCIUM: 9.1 mg/dL (ref 8.7–10.2)
CO2: 19 mmol/L — AB (ref 20–29)
CREATININE: 1.17 mg/dL (ref 0.76–1.27)
Chloride: 103 mmol/L (ref 96–106)
GFR calc Af Amer: 87 mL/min/{1.73_m2} (ref >59)
GFR calc non Af Amer: 75 mL/min/{1.73_m2} (ref >59)
GLOBULIN, TOTAL: 3 g/dL (ref 1.5–4.5)
Glucose: 100 mg/dL — ABNORMAL HIGH (ref 65–99)
Potassium: 4.2 mmol/L (ref 3.5–5.2)
SODIUM: 138 mmol/L (ref 134–144)
Total Protein: 7.5 g/dL (ref 6.0–8.5)

## 2017-07-06 LAB — URIC ACID: URIC ACID: 7 mg/dL (ref 3.7–8.6)

## 2017-07-06 LAB — LACTATE DEHYDROGENASE: LDH: 323 IU/L — ABNORMAL HIGH (ref 121–224)

## 2017-07-09 ENCOUNTER — Telehealth: Payer: Self-pay | Admitting: *Deleted

## 2017-07-09 NOTE — Telephone Encounter (Signed)
Pt informed "lab good" per Dr Jearld Pies. Stated felt  "sick past last 2 weeks"; feeling weak, no cough, periods of sweating then cold. But he feels better now.

## 2017-07-09 NOTE — Telephone Encounter (Signed)
Called pt - no answer; left message to give me a call back. 

## 2017-07-09 NOTE — Telephone Encounter (Signed)
-----   Message from [name], MD sent at 07/06/2017  2:57 PM EDT ----- Call pt: lab good. Any recent infections? White count running a little higher than usual.

## 2017-07-27 ENCOUNTER — Other Ambulatory Visit: Payer: Self-pay

## 2017-07-27 DIAGNOSIS — D47Z9 Other specified neoplasms of uncertain behavior of lymphoid, hematopoietic and related tissue: Secondary | ICD-10-CM

## 2017-07-27 DIAGNOSIS — R161 Splenomegaly, not elsewhere classified: Secondary | ICD-10-CM

## 2017-07-27 DIAGNOSIS — C946 Myelodysplastic disease, not classified: Secondary | ICD-10-CM

## 2017-07-27 NOTE — Telephone Encounter (Signed)
oxyCODONE-acetaminophen (PERCOCET) 10-325 MG tablet, refill request.  

## 2017-07-30 ENCOUNTER — Other Ambulatory Visit: Payer: Self-pay | Admitting: Oncology

## 2017-07-30 DIAGNOSIS — C946 Myelodysplastic disease, not classified: Secondary | ICD-10-CM

## 2017-07-30 DIAGNOSIS — D47Z9 Other specified neoplasms of uncertain behavior of lymphoid, hematopoietic and related tissue: Secondary | ICD-10-CM

## 2017-07-30 DIAGNOSIS — R161 Splenomegaly, not elsewhere classified: Secondary | ICD-10-CM

## 2017-07-30 MED ORDER — OXYCODONE-ACETAMINOPHEN 10-325 MG PO TABS: 1.0000 | ORAL_TABLET | Freq: Four times a day (QID) | ORAL | 0 refills | Status: DC | PRN

## 2017-07-30 NOTE — Telephone Encounter (Signed)
Please have him schedule an appt when he comes to pick up Rx & let him know I will not fill any more until he is on my MD visit schedule.

## 2017-07-30 NOTE — Telephone Encounter (Signed)
Patient will pick up prescription today or tomorrow, informed of Rx p/u times. Also informed he will need to set up appt w/ Dr. Darnell Level before Rx is given. Verbalized understanding.

## 2017-08-13 ENCOUNTER — Encounter (INDEPENDENT_AMBULATORY_CARE_PROVIDER_SITE_OTHER): Payer: Self-pay

## 2017-08-13 ENCOUNTER — Other Ambulatory Visit (INDEPENDENT_AMBULATORY_CARE_PROVIDER_SITE_OTHER): Payer: BLUE CROSS/BLUE SHIELD

## 2017-08-13 DIAGNOSIS — R161 Splenomegaly, not elsewhere classified: Secondary | ICD-10-CM | POA: Diagnosis not present

## 2017-08-13 DIAGNOSIS — C946 Myelodysplastic disease, not classified: Secondary | ICD-10-CM

## 2017-08-13 DIAGNOSIS — D47Z9 Other specified neoplasms of uncertain behavior of lymphoid, hematopoietic and related tissue: Secondary | ICD-10-CM

## 2017-08-14 LAB — CBC WITH DIFFERENTIAL/PLATELET
BASOS ABS: 0 10*3/uL (ref 0.0–0.2)
Basos: 0 %
EOS (ABSOLUTE): 0.1 10*3/uL (ref 0.0–0.4)
EOS: 1 %
HEMATOCRIT: 47.5 % (ref 37.5–51.0)
Hemoglobin: 15.6 g/dL (ref 13.0–17.7)
Immature Grans (Abs): 0 10*3/uL (ref 0.0–0.1)
Immature Granulocytes: 0 %
LYMPHS ABS: 0.5 10*3/uL — AB (ref 0.7–3.1)
Lymphs: 6 %
MCH: 27.6 pg (ref 26.6–33.0)
MCHC: 32.8 g/dL (ref 31.5–35.7)
MCV: 84 fL (ref 79–97)
MONOS ABS: 0.2 10*3/uL (ref 0.1–0.9)
Monocytes: 2 %
Neutrophils Absolute: 8.5 10*3/uL — ABNORMAL HIGH (ref 1.4–7.0)
Neutrophils: 91 %
Platelets: 275 10*3/uL (ref 150–379)
RBC: 5.66 x10E6/uL (ref 4.14–5.80)
RDW: 15.4 % (ref 12.3–15.4)
WBC: 9.4 10*3/uL (ref 3.4–10.8)

## 2017-08-14 LAB — COMPREHENSIVE METABOLIC PANEL
ALK PHOS: 93 IU/L (ref 39–117)
ALT: 23 IU/L (ref 0–44)
AST: 17 IU/L (ref 0–40)
Albumin/Globulin Ratio: 1.4 (ref 1.2–2.2)
Albumin: 4.3 g/dL (ref 3.5–5.5)
BILIRUBIN TOTAL: 0.3 mg/dL (ref 0.0–1.2)
BUN/Creatinine Ratio: 9 (ref 9–20)
BUN: 12 mg/dL (ref 6–24)
CHLORIDE: 103 mmol/L (ref 96–106)
CO2: 24 mmol/L (ref 20–29)
CREATININE: 1.41 mg/dL — AB (ref 0.76–1.27)
Calcium: 9.1 mg/dL (ref 8.7–10.2)
GFR calc Af Amer: 69 mL/min/{1.73_m2} (ref >59)
GFR calc non Af Amer: 60 mL/min/{1.73_m2} (ref >59)
GLOBULIN, TOTAL: 3 g/dL (ref 1.5–4.5)
Glucose: 113 mg/dL — ABNORMAL HIGH (ref 65–99)
POTASSIUM: 4.2 mmol/L (ref 3.5–5.2)
SODIUM: 143 mmol/L (ref 134–144)
Total Protein: 7.3 g/dL (ref 6.0–8.5)

## 2017-08-14 LAB — LACTATE DEHYDROGENASE: LDH: 306 IU/L — AB (ref 121–224)

## 2017-08-14 LAB — URIC ACID: URIC ACID: 6.2 mg/dL (ref 3.7–8.6)

## 2017-08-27 ENCOUNTER — Other Ambulatory Visit: Payer: Self-pay

## 2017-08-27 DIAGNOSIS — D47Z9 Other specified neoplasms of uncertain behavior of lymphoid, hematopoietic and related tissue: Secondary | ICD-10-CM

## 2017-08-27 DIAGNOSIS — C946 Myelodysplastic disease, not classified: Secondary | ICD-10-CM

## 2017-08-27 DIAGNOSIS — R161 Splenomegaly, not elsewhere classified: Secondary | ICD-10-CM

## 2017-08-27 NOTE — Telephone Encounter (Signed)
oxyCODONE-acetaminophen (PERCOCET) 10-325 MG tablet, Refill request.

## 2017-08-28 MED ORDER — OXYCODONE-ACETAMINOPHEN 10-325 MG PO TABS: 1.0000 | ORAL_TABLET | Freq: Four times a day (QID) | ORAL | 0 refills | Status: DC | PRN

## 2017-08-28 NOTE — Telephone Encounter (Signed)
Would like to know if Rx is ready for pick up. Please call pt back.

## 2017-08-28 NOTE — Telephone Encounter (Signed)
Oxycodone rx ready - pt called.

## 2017-09-11 ENCOUNTER — Other Ambulatory Visit: Payer: Self-pay

## 2017-09-11 ENCOUNTER — Other Ambulatory Visit: Payer: Self-pay | Admitting: Pharmacist

## 2017-09-11 ENCOUNTER — Encounter: Payer: Self-pay | Admitting: Oncology

## 2017-09-11 ENCOUNTER — Ambulatory Visit (INDEPENDENT_AMBULATORY_CARE_PROVIDER_SITE_OTHER): Payer: BLUE CROSS/BLUE SHIELD | Admitting: Oncology

## 2017-09-11 VITALS — BP 134/80 | HR 84 | Temp 98.0°F | Ht 72.0 in | Wt 208.5 lb

## 2017-09-11 DIAGNOSIS — Z886 Allergy status to analgesic agent status: Secondary | ICD-10-CM

## 2017-09-11 DIAGNOSIS — D47Z9 Other specified neoplasms of uncertain behavior of lymphoid, hematopoietic and related tissue: Secondary | ICD-10-CM

## 2017-09-11 DIAGNOSIS — C946 Myelodysplastic disease, not classified: Secondary | ICD-10-CM

## 2017-09-11 DIAGNOSIS — L818 Other specified disorders of pigmentation: Secondary | ICD-10-CM

## 2017-09-11 DIAGNOSIS — R161 Splenomegaly, not elsewhere classified: Secondary | ICD-10-CM | POA: Diagnosis not present

## 2017-09-11 DIAGNOSIS — Z79891 Long term (current) use of opiate analgesic: Secondary | ICD-10-CM | POA: Diagnosis not present

## 2017-09-11 DIAGNOSIS — Z79899 Other long term (current) drug therapy: Secondary | ICD-10-CM

## 2017-09-11 MED ORDER — OXYCODONE-ACETAMINOPHEN 10-325 MG PO TABS: 1.0000 | ORAL_TABLET | Freq: Four times a day (QID) | ORAL | 0 refills | Status: DC | PRN

## 2017-09-11 MED ORDER — RUXOLITINIB PHOSPHATE 20 MG PO TABS: 20.0000 mg | ORAL_TABLET | Freq: Two times a day (BID) | ORAL | 11 refills | Status: DC

## 2017-09-11 NOTE — Patient Instructions (Addendum)
Continue lab every 2 months MD visit 4-5 months

## 2017-09-11 NOTE — Progress Notes (Signed)
Transferred prescription for [facility] to [place], [place], [phone], per patient's insurance plan. Patient was notified and advised to call me if he has any other questions. Patient verbalized understanding by repeat back.

## 2017-09-11 NOTE — Progress Notes (Signed)
Hematology and Oncology Follow Up Visit  Sean Walter [id number] January 14, 1971 46 y.o. 09/11/2017 7:31 PM   Principle Diagnosis: Encounter Diagnoses  Name Primary?  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 Long term current use of opiate analgesic   . Spleen enlargement   Updated clinical summary: 46 year old man who was initially called myelofibrosis but who more likely has a non-BCR-ABL myeloproliferative disorder. He presented with leukocytosis and massive splenomegaly in January 2012. Bone marrow biopsy done in 11/16/10 was hypercellular with prominent proliferation of megakaryocytes many of which had abnormal morphology. Collagen and reticulin stains were focally positive. There were no excess blasts; in fact, on a 500 cell count differential, 0% blasts were recorded. Routine cytogenetics were normal but FISH probes were not done. Peripheral blood was negative for the presence of BCR-ABL transcripts. He isJAK-2 gene mutation positive. (03/07/13) The patient was started on JAKOFI in February, 2012. Current dose is 20 mg twice daily. He has had doses as high as 25 mg twice daily.  I have followed him since July of 2013. Blood counts initially stable and there was a significant decrease in his spleen size although he continued to complain of pain and requests Percocet on a regular basis.  He developed cumulative myelotoxicity requiring a dose reduction from 25 mg twice daily down to 20 mg twice daily in late August 2015. There was a discrepancy in my notes where I said I was decreasing him to 50 mg twice daily in August 2015. Itcame to my attention that the patient was actually only taking 10 mg twice daily. It is no surprise that his blood counts completely recovered but also that his spleen started to enlarge again at time;ultrasound done on 01/08/2015 with spleen dimensions 17 x 6 x 17 cm and volume 1189 mL cubed. I wrote a new prescription for 20 mg  twice daily which he started taking on 01/31/2015. Blood counts stabilized. Ultrasound of the spleen done on 07/08/2015 showed spleen size mildly decreased compared with the March study now measuring 16 x 5 x 17 cm. most recent ultrasound done 06/30/2016: Spleen 18.3 x 16.2 x 4.9 cm. 758 mm.    Interim History:  Overall doing well.  I have not been able to taper him to less than 60 tablets/month on his Percocet.  He reports that he has days when he has significant pain in his left upper quadrant/left flank.  His job requires lifting heavy boxes for [employer]. He has now been on the [facility] for 6-1/2 years.  Blood counts incredibly stable.  Labs done in anticipation of today's visit on October 22 with hemoglobin 15.6, white count 9400 with 91% neutrophils, 6% lymphocytes, and platelet count 275,000. His molluscum contagiosum scalp eruption has completely resolved.  He just got his second 39 year old son down to [place] from [place].  He will now join his twin brother here.  He has another I believe 53 year old son who will be moving here soon.  He got them both jobs at [employer]. He has had no communications from [facility] in a long time.  I sent him there in the past for a transplant evaluation.  His 91 year old son is the potential donor.  Medications: reviewed  Allergies:  Allergies  Allergen Reactions  . Ibuprofen Other (See Comments)    Indigestion Indigestion    Review of Systems:  Remaining ROS negative:   Physical Exam: Blood pressure 134/80, pulse 84, temperature 98 F (36.7 C), temperature source Oral, height 6' (  1.829 m), weight 208 lb 8 oz (94.6 kg), SpO2 98 %. Wt Readings from Last 3 Encounters:  09/11/17 208 lb 8 oz (94.6 kg)  04/03/17 214 lb 9.6 oz (97.3 kg)  11/07/16 220 lb 12.8 oz (100.2 kg)     General appearance: Well-nourished muscular African-American man HENNT: Pharynx no erythema, exudate, mass, or ulcer. No thyromegaly or thyroid nodules Lymph  nodes: No cervical, supraclavicular, or axillary lymphadenopathy Breasts: Lungs: Clear to auscultation, resonant to percussion throughout Heart: Regular rhythm, no murmur, no gallop, no rub, no click, no edema Abdomen: Soft, nontender, normal bowel sounds, no mass, no organomegaly despite splenomegaly on scan Extremities: No edema, no calf tenderness Musculoskeletal: no joint deformities GU:  Vascular: Carotid pulses 2+, no bruits, distal pulses: Dorsalis pedis 1+ symmetric Neurologic: Alert, oriented, PERRLA, optic discs sharp and vessels normal, no hemorrhage or exudate, cranial nerves grossly normal, motor strength 5 over 5, reflexes 1+ symmetric, upper body coordination normal, gait normal, Skin: No rash or ecchymosis.  Multiple tattoos.  Lab Results: CBC W/Diff    Component Value Date/Time   WBC 9.4 08/13/2017 0956   WBC 7.6 04/30/2015 1335   RBC 5.66 08/13/2017 0956   RBC 5.08 04/30/2015 1335   HGB 15.6 08/13/2017 0956   HGB 12.0 (L) 01/05/2014 1035   HCT 47.5 08/13/2017 0956   HCT 37.1 (L) 01/05/2014 1035   PLT 275 08/13/2017 0956   MCV 84 08/13/2017 0956   MCV 79.1 (L) 01/05/2014 1035   MCH 27.6 08/13/2017 0956   MCH 26.6 04/30/2015 1335   MCHC 32.8 08/13/2017 0956   MCHC 32.7 04/30/2015 1335   RDW 15.4 08/13/2017 0956   RDW 16.8 (H) 01/05/2014 1035   LYMPHSABS 0.5 (L) 08/13/2017 0956   LYMPHSABS 0.7 (L) 01/05/2014 1035   MONOABS 0.2 04/30/2015 1335   MONOABS 0.3 01/05/2014 1035   EOSABS 0.1 08/13/2017 0956   BASOSABS 0.0 08/13/2017 0956   BASOSABS 0.1 01/05/2014 1035     Chemistry      Component Value Date/Time   NA 143 08/13/2017 0956   NA 141 01/05/2014 1035   K 4.2 08/13/2017 0956   K 3.9 01/05/2014 1035   CL 103 08/13/2017 0956   CL 106 03/07/2013 0826   CO2 24 08/13/2017 0956   CO2 24 01/05/2014 1035   BUN 12 08/13/2017 0956   BUN 11.3 01/05/2014 1035   CREATININE 1.41 (H) 08/13/2017 0956   CREATININE 1.21 04/30/2015 1335   CREATININE 1.8 (H)  01/05/2014 1035      Component Value Date/Time   CALCIUM 9.1 08/13/2017 0956   CALCIUM 9.5 01/05/2014 1035   ALKPHOS 93 08/13/2017 0956   ALKPHOS 57 01/05/2014 1035   AST 17 08/13/2017 0956   AST 21 01/05/2014 1035   ALT 23 08/13/2017 0956   ALT 31 01/05/2014 1035   BILITOT 0.3 08/13/2017 0956   BILITOT 0.49 01/05/2014 1035       Radiological Studies: No results found.  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 Persistent moderate splenomegaly but normal trilineage hematopoiesis on long-term JAKOFI. Plan: Continue the same.  Prescription renewed for JAKOFI 20 mg twice daily. He is still interested in his potential to have a bone marrow transplant.  I told him that right now he has really beaten all of the odds with respect to the long-term stability of his blood counts and may not require a transplant.  If there is any change in his bone marrow function,  we would repeat a bone marrow biopsy.  I do not see any reason to do this at this time.  I would like to get him back on [facility] radar screen.  2.  Resolved molluscum contagiosum rash  3.  Chronic splenomegaly related to 1  4.  Chronic habituation to Percocet.  He has a signed pain contract with [place].  I have been able to taper his monthly allotment but cannot get him lower than 60 tablets/month at this time.    CC: Patient Care Team: Neva Seat, MD as PCP - General (Internal Medicine) [name], MD as Referring Physician (Internal Medicine)   Murriel Hopper, MD, [place]  Hematology-Oncology/Internal Medicine     11/20/20187:31 PM

## 2017-09-11 NOTE — Progress Notes (Signed)
Thanks

## 2017-09-23 ENCOUNTER — Emergency Department (HOSPITAL_COMMUNITY)
Admission: EM | Admit: 2017-09-23 | Discharge: 2017-09-23 | Disposition: A | Discharge status: Home / Self Care | Payer: BLUE CROSS/BLUE SHIELD | Attending: Emergency Medicine | Admitting: Emergency Medicine

## 2017-09-23 ENCOUNTER — Encounter (HOSPITAL_COMMUNITY): Payer: Self-pay

## 2017-09-23 ENCOUNTER — Emergency Department (HOSPITAL_COMMUNITY): Payer: BLUE CROSS/BLUE SHIELD

## 2017-09-23 DIAGNOSIS — S39012A Strain of muscle, fascia and tendon of lower back, initial encounter: Secondary | ICD-10-CM | POA: Insufficient documentation

## 2017-09-23 DIAGNOSIS — T148XXA Other injury of unspecified body region, initial encounter: Secondary | ICD-10-CM

## 2017-09-23 DIAGNOSIS — F17210 Nicotine dependence, cigarettes, uncomplicated: Secondary | ICD-10-CM | POA: Diagnosis not present

## 2017-09-23 DIAGNOSIS — Y939 Activity, unspecified: Secondary | ICD-10-CM | POA: Insufficient documentation

## 2017-09-23 DIAGNOSIS — S161XXA Strain of muscle, fascia and tendon at neck level, initial encounter: Secondary | ICD-10-CM | POA: Diagnosis not present

## 2017-09-23 DIAGNOSIS — Z79899 Other long term (current) drug therapy: Secondary | ICD-10-CM | POA: Insufficient documentation

## 2017-09-23 DIAGNOSIS — S3992XA Unspecified injury of lower back, initial encounter: Secondary | ICD-10-CM | POA: Diagnosis present

## 2017-09-23 DIAGNOSIS — Y999 Unspecified external cause status: Secondary | ICD-10-CM | POA: Diagnosis not present

## 2017-09-23 DIAGNOSIS — J45909 Unspecified asthma, uncomplicated: Secondary | ICD-10-CM | POA: Diagnosis not present

## 2017-09-23 DIAGNOSIS — Y92410 Unspecified street and highway as the place of occurrence of the external cause: Secondary | ICD-10-CM | POA: Insufficient documentation

## 2017-09-23 MED ORDER — CYCLOBENZAPRINE HCL 10 MG PO TABS: 10.0000 mg | ORAL_TABLET | Freq: Two times a day (BID) | ORAL | 0 refills | Status: DC | PRN
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
## 2017-09-23 NOTE — ED Notes (Signed)
Pt returned from X Ray.

## 2017-09-25 ENCOUNTER — Ambulatory Visit (INDEPENDENT_AMBULATORY_CARE_PROVIDER_SITE_OTHER): Payer: Self-pay | Admitting: Internal Medicine

## 2017-09-25 ENCOUNTER — Encounter: Payer: Self-pay | Admitting: Internal Medicine

## 2017-09-25 ENCOUNTER — Ambulatory Visit: Payer: BLUE CROSS/BLUE SHIELD

## 2017-09-25 ENCOUNTER — Other Ambulatory Visit: Payer: Self-pay

## 2017-09-25 ENCOUNTER — Encounter (INDEPENDENT_AMBULATORY_CARE_PROVIDER_SITE_OTHER): Payer: Self-pay

## 2017-09-25 ENCOUNTER — Telehealth: Payer: Self-pay | Admitting: Internal Medicine

## 2017-09-25 DIAGNOSIS — Z79891 Long term (current) use of opiate analgesic: Secondary | ICD-10-CM

## 2017-09-25 DIAGNOSIS — Z791 Long term (current) use of non-steroidal anti-inflammatories (NSAID): Secondary | ICD-10-CM

## 2017-09-25 DIAGNOSIS — M545 Low back pain: Secondary | ICD-10-CM

## 2017-09-25 DIAGNOSIS — G8911 Acute pain due to trauma: Secondary | ICD-10-CM

## 2017-09-25 DIAGNOSIS — R161 Splenomegaly, not elsewhere classified: Secondary | ICD-10-CM

## 2017-09-25 NOTE — Progress Notes (Signed)
Internal Medicine Clinic Attending  I saw and evaluated the patient.  I personally confirmed the key portions of the history and exam documented by Dr. Melvin and I reviewed pertinent patient test results.  The assessment, diagnosis, and plan were formulated together and I agree with the documentation in the resident's note.  
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
## 2017-09-25 NOTE — Telephone Encounter (Signed)
Patient is requesting is a nurse or physician to call back, the physician wrote him to do light duty, per the employer there not light duty. He needs a note to be taking out of work.

## 2017-09-28 ENCOUNTER — Encounter: Payer: Self-pay | Admitting: Internal Medicine

## 2017-09-28 ENCOUNTER — Telehealth: Payer: Self-pay | Admitting: Internal Medicine

## 2017-09-28 NOTE — Telephone Encounter (Signed)
Patient would like Mrs Holley Raring to give a call back pls

## 2017-09-28 NOTE — Telephone Encounter (Signed)
Dr Trilby Drummer is aware and he will call pt

## 2017-10-03 ENCOUNTER — Other Ambulatory Visit: Payer: Self-pay | Admitting: Internal Medicine

## 2017-10-03 ENCOUNTER — Other Ambulatory Visit: Payer: Self-pay | Admitting: Oncology

## 2017-10-03 DIAGNOSIS — R161 Splenomegaly, not elsewhere classified: Secondary | ICD-10-CM

## 2017-10-03 DIAGNOSIS — D47Z9 Other specified neoplasms of uncertain behavior of lymphoid, hematopoietic and related tissue: Secondary | ICD-10-CM

## 2017-10-03 DIAGNOSIS — C946 Myelodysplastic disease, not classified: Secondary | ICD-10-CM

## 2017-10-03 MED ORDER — OXYCODONE-ACETAMINOPHEN 10-325 MG PO TABS: 1.0000 | ORAL_TABLET | Freq: Four times a day (QID) | ORAL | 0 refills | Status: DC | PRN

## 2017-10-03 NOTE — Telephone Encounter (Signed)
Last office visit: 09/25/2017 Last UDS: 12/01/2015 Last Refill:  Last written 11/20  Next appt: none scheduled

## 2017-10-03 NOTE — Telephone Encounter (Signed)
Refill Request ° ° °oxyCODONE-acetaminophen (PERCOCET) 10-325 MG tablet °

## 2017-10-04 NOTE — Telephone Encounter (Signed)
Pt called / informed Percocet rx refilled and sent to [facility] pharmacy.

## 2017-10-12 ENCOUNTER — Other Ambulatory Visit: Payer: BLUE CROSS/BLUE SHIELD

## 2017-10-24 ENCOUNTER — Telehealth: Payer: Self-pay | Admitting: Internal Medicine

## 2017-10-24 NOTE — Telephone Encounter (Signed)
Patient requesting refill Percocet, pls call patient

## 2017-10-29 ENCOUNTER — Other Ambulatory Visit: Payer: Self-pay | Admitting: *Deleted

## 2017-10-29 DIAGNOSIS — R161 Splenomegaly, not elsewhere classified: Secondary | ICD-10-CM

## 2017-10-29 DIAGNOSIS — C946 Myelodysplastic disease, not classified: Secondary | ICD-10-CM

## 2017-10-29 DIAGNOSIS — D47Z9 Other specified neoplasms of uncertain behavior of lymphoid, hematopoietic and related tissue: Secondary | ICD-10-CM

## 2017-10-29 MED ORDER — OXYCODONE-ACETAMINOPHEN 10-325 MG PO TABS: 1.0000 | ORAL_TABLET | Freq: Four times a day (QID) | ORAL | 0 refills | Status: DC | PRN

## 2017-10-29 NOTE — Telephone Encounter (Signed)
Refill approved, will start 1 month from previous refill (11/04/17).

## 2017-11-08 ENCOUNTER — Telehealth: Payer: Self-pay | Admitting: Internal Medicine

## 2017-11-08 NOTE — Telephone Encounter (Signed)
Sean Walter from [place] wants to leave message with Dr Beryle Beams, pls call back at [phone].  Patient no show for Monday, unable to leave message on cell phone, left message with mother and didn't call back.  Unsure patient condition.

## 2017-11-09 NOTE — Telephone Encounter (Signed)
Return call to [facility] - no answer; left message to give [place] a call back.

## 2017-12-03 ENCOUNTER — Other Ambulatory Visit: Payer: Self-pay | Admitting: Oncology

## 2017-12-03 ENCOUNTER — Telehealth: Payer: Self-pay | Admitting: Internal Medicine

## 2017-12-03 DIAGNOSIS — R161 Splenomegaly, not elsewhere classified: Secondary | ICD-10-CM

## 2017-12-03 DIAGNOSIS — C946 Myelodysplastic disease, not classified: Secondary | ICD-10-CM

## 2017-12-03 DIAGNOSIS — D47Z9 Other specified neoplasms of uncertain behavior of lymphoid, hematopoietic and related tissue: Secondary | ICD-10-CM

## 2017-12-03 MED ORDER — OXYCODONE-ACETAMINOPHEN 10-325 MG PO TABS: 1.0000 | ORAL_TABLET | Freq: Four times a day (QID) | ORAL | 0 refills | Status: DC | PRN

## 2017-12-03 NOTE — Telephone Encounter (Signed)
Also note: pt failed to report for his follow up visit with the [facility] transplant team

## 2017-12-03 NOTE — Telephone Encounter (Signed)
Last rx written 11/04/17. Last OV 09/25/17 with Dr Trilby Drummer. Next OV 4/29 with Dr Beryle Beams. UDS 12/01/15.

## 2017-12-03 NOTE — Telephone Encounter (Signed)
Please schedule an appt w/Dr Trilby Drummer - to continue refills. Thanks

## 2017-12-03 NOTE — Telephone Encounter (Signed)
Patient is requesting refills on percocet

## 2017-12-03 NOTE — Telephone Encounter (Signed)
Patient will need an appointment before receiving further refill of this narcotic pain medication.

## 2017-12-03 NOTE — Telephone Encounter (Signed)
Patient will be provided 1 refill. Please inform him will have to be seen in the clinic before further refills for this narcotic pain medication will be written.

## 2017-12-04 NOTE — Telephone Encounter (Signed)
Will schedule the patient to be seen in [facility] in March when the schedule becomes available since Dr. Trilby Drummer will be one of the doctors working that month.

## 2017-12-07 ENCOUNTER — Encounter (INDEPENDENT_AMBULATORY_CARE_PROVIDER_SITE_OTHER): Payer: Self-pay

## 2017-12-07 ENCOUNTER — Other Ambulatory Visit (INDEPENDENT_AMBULATORY_CARE_PROVIDER_SITE_OTHER): Payer: BLUE CROSS/BLUE SHIELD

## 2017-12-07 DIAGNOSIS — D47Z9 Other specified neoplasms of uncertain behavior of lymphoid, hematopoietic and related tissue: Secondary | ICD-10-CM

## 2017-12-07 DIAGNOSIS — R161 Splenomegaly, not elsewhere classified: Secondary | ICD-10-CM

## 2017-12-07 DIAGNOSIS — Z79891 Long term (current) use of opiate analgesic: Secondary | ICD-10-CM

## 2017-12-07 DIAGNOSIS — C946 Myelodysplastic disease, not classified: Secondary | ICD-10-CM

## 2017-12-08 LAB — COMPREHENSIVE METABOLIC PANEL
A/G RATIO: 1.4 (ref 1.2–2.2)
ALBUMIN: 4.6 g/dL (ref 3.5–5.5)
ALK PHOS: 106 IU/L (ref 39–117)
ALT: 43 IU/L (ref 0–44)
AST: 28 IU/L (ref 0–40)
BUN / CREAT RATIO: 7 — AB (ref 9–20)
BUN: 8 mg/dL (ref 6–24)
Bilirubin Total: 0.4 mg/dL (ref 0.0–1.2)
CALCIUM: 9.2 mg/dL (ref 8.7–10.2)
CO2: 23 mmol/L (ref 20–29)
Chloride: 101 mmol/L (ref 96–106)
Creatinine, Ser: 1.17 mg/dL (ref 0.76–1.27)
GFR calc Af Amer: 87 mL/min/{1.73_m2} (ref >59)
GFR, EST NON AFRICAN AMERICAN: 75 mL/min/{1.73_m2} (ref >59)
GLOBULIN, TOTAL: 3.2 g/dL (ref 1.5–4.5)
Glucose: 91 mg/dL (ref 65–99)
POTASSIUM: 4.3 mmol/L (ref 3.5–5.2)
SODIUM: 141 mmol/L (ref 134–144)
Total Protein: 7.8 g/dL (ref 6.0–8.5)

## 2017-12-08 LAB — CBC WITH DIFFERENTIAL/PLATELET
Basophils Absolute: 0.1 10*3/uL (ref 0.0–0.2)
Basos: 0 %
EOS (ABSOLUTE): 0.1 10*3/uL (ref 0.0–0.4)
Eos: 1 %
Hematocrit: 53.7 % — ABNORMAL HIGH (ref 37.5–51.0)
Hemoglobin: 17.6 g/dL (ref 13.0–17.7)
IMMATURE GRANULOCYTES: 0 %
Immature Grans (Abs): 0 10*3/uL (ref 0.0–0.1)
LYMPHS ABS: 0.5 10*3/uL — AB (ref 0.7–3.1)
Lymphs: 4 %
MCH: 26.5 pg — AB (ref 26.6–33.0)
MCHC: 32.8 g/dL (ref 31.5–35.7)
MCV: 81 fL (ref 79–97)
MONOS ABS: 0.3 10*3/uL (ref 0.1–0.9)
Monocytes: 2 %
NEUTROS PCT: 93 %
Neutrophils Absolute: 11.1 10*3/uL — ABNORMAL HIGH (ref 1.4–7.0)
PLATELETS: 348 10*3/uL (ref 150–379)
RBC: 6.65 x10E6/uL — ABNORMAL HIGH (ref 4.14–5.80)
RDW: 15.7 % — AB (ref 12.3–15.4)
WBC: 12 10*3/uL — ABNORMAL HIGH (ref 3.4–10.8)

## 2017-12-08 LAB — LACTATE DEHYDROGENASE: LDH: 441 IU/L — ABNORMAL HIGH (ref 121–224)

## 2017-12-08 LAB — URIC ACID: Uric Acid: 7.8 mg/dL (ref 3.7–8.6)

## 2017-12-10 ENCOUNTER — Other Ambulatory Visit: Payer: Self-pay | Admitting: Oncology

## 2017-12-10 ENCOUNTER — Telehealth: Payer: Self-pay | Admitting: *Deleted

## 2017-12-10 DIAGNOSIS — D47Z9 Other specified neoplasms of uncertain behavior of lymphoid, hematopoietic and related tissue: Secondary | ICD-10-CM

## 2017-12-10 DIAGNOSIS — C946 Myelodysplastic disease, not classified: Secondary | ICD-10-CM

## 2017-12-10 NOTE — Telephone Encounter (Signed)
Pt called / informed "blood counts all higher than usual. "Per Dr Beryle Beams. Stated he has only been taking 1 tab of Jakfi daily  Since beginning of last month b/c he only had 1 bottle and afraid of running out. Stated unsure why he has not received the other bottles of med.

## 2017-12-10 NOTE — Telephone Encounter (Signed)
-----   Message from [name], MD sent at 12/10/2017  1:19 PM EST ----- Call pt: blood counts all higher than usual. Is he still taking the [place] as prescribed?  Need to repeat CBC in 2 weeks. Please have Tamela Oddi move up his appointment to 2/26 AM

## 2017-12-18 ENCOUNTER — Ambulatory Visit (INDEPENDENT_AMBULATORY_CARE_PROVIDER_SITE_OTHER): Payer: BLUE CROSS/BLUE SHIELD | Admitting: Oncology

## 2017-12-18 ENCOUNTER — Encounter: Payer: Self-pay | Admitting: Oncology

## 2017-12-18 ENCOUNTER — Other Ambulatory Visit: Payer: Self-pay

## 2017-12-18 VITALS — BP 145/76 | HR 82 | Temp 98.0°F | Ht 72.0 in | Wt 213.0 lb

## 2017-12-18 DIAGNOSIS — R1012 Left upper quadrant pain: Secondary | ICD-10-CM

## 2017-12-18 DIAGNOSIS — G8929 Other chronic pain: Secondary | ICD-10-CM

## 2017-12-18 DIAGNOSIS — D7581 Myelofibrosis: Secondary | ICD-10-CM | POA: Diagnosis not present

## 2017-12-18 DIAGNOSIS — Z79891 Long term (current) use of opiate analgesic: Secondary | ICD-10-CM

## 2017-12-18 DIAGNOSIS — G8911 Acute pain due to trauma: Secondary | ICD-10-CM | POA: Diagnosis not present

## 2017-12-18 DIAGNOSIS — R161 Splenomegaly, not elsewhere classified: Secondary | ICD-10-CM | POA: Diagnosis not present

## 2017-12-18 DIAGNOSIS — Z856 Personal history of leukemia: Secondary | ICD-10-CM | POA: Diagnosis not present

## 2017-12-18 DIAGNOSIS — D47Z9 Other specified neoplasms of uncertain behavior of lymphoid, hematopoietic and related tissue: Secondary | ICD-10-CM | POA: Diagnosis not present

## 2017-12-18 DIAGNOSIS — Z886 Allergy status to analgesic agent status: Secondary | ICD-10-CM | POA: Diagnosis not present

## 2017-12-18 DIAGNOSIS — C946 Myelodysplastic disease, not classified: Secondary | ICD-10-CM

## 2017-12-18 LAB — CBC WITH DIFFERENTIAL/PLATELET
Basophils Absolute: 0 10*3/uL (ref 0.0–0.1)
Basophils Relative: 1 %
EOS PCT: 2 %
Eosinophils Absolute: 0.1 10*3/uL (ref 0.0–0.7)
HEMATOCRIT: 53.3 % — AB (ref 39.0–52.0)
Hemoglobin: 16.9 g/dL (ref 13.0–17.0)
LYMPHS ABS: 0.7 10*3/uL (ref 0.7–4.0)
LYMPHS PCT: 8 %
MCH: 26.4 pg (ref 26.0–34.0)
MCHC: 31.7 g/dL (ref 30.0–36.0)
MCV: 83.4 fL (ref 78.0–100.0)
MONO ABS: 0.2 10*3/uL (ref 0.1–1.0)
MONOS PCT: 2 %
NEUTROS ABS: 7.6 10*3/uL (ref 1.7–7.7)
Neutrophils Relative %: 87 %
PLATELETS: 259 10*3/uL (ref 150–400)
RBC: 6.39 MIL/uL — ABNORMAL HIGH (ref 4.22–5.81)
RDW: 15.5 % (ref 11.5–15.5)
WBC: 8.7 10*3/uL (ref 4.0–10.5)

## 2017-12-18 LAB — SAVE SMEAR
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
## 2017-12-18 NOTE — Addendum Note (Signed)
Addended by: Truddie Crumble on: 12/18/2017 09:36 AM   Modules accepted: Orders

## 2017-12-18 NOTE — Patient Instructions (Addendum)
To lab today Continue every 2 month lab checks - next 4/15 I will call & let you know what to do about the JAKOFI dose Visit with Dr Darnell Level in 4 months

## 2017-12-21 ENCOUNTER — Telehealth: Payer: Self-pay | Admitting: *Deleted

## 2017-12-21 ENCOUNTER — Ambulatory Visit (HOSPITAL_COMMUNITY)
Admission: RE | Admit: 2017-12-21 | Discharge: 2017-12-21 | Disposition: A | Discharge status: Home / Self Care | Payer: BLUE CROSS/BLUE SHIELD | Source: Ambulatory Visit | Attending: Oncology | Admitting: Oncology

## 2017-12-21 DIAGNOSIS — R161 Splenomegaly, not elsewhere classified: Secondary | ICD-10-CM | POA: Insufficient documentation

## 2017-12-21 DIAGNOSIS — D47Z9 Other specified neoplasms of uncertain behavior of lymphoid, hematopoietic and related tissue: Secondary | ICD-10-CM | POA: Insufficient documentation

## 2017-12-21 DIAGNOSIS — C946 Myelodysplastic disease, not classified: Secondary | ICD-10-CM

## 2017-12-21 NOTE — Telephone Encounter (Signed)
Called pt - no answer; left message to give me a call back. 

## 2017-12-21 NOTE — Telephone Encounter (Signed)
Fax from [place] has bee approved 12/20/2017 through 2/28/20120.  Regino Schultze Eliette Drumwright,RN 12/21/2017 1:42 PM.

## 2017-12-21 NOTE — Telephone Encounter (Signed)
Pt called back - informed "spleen larger compared with 2017 study. He needs to take his JAKOFI twice a day as prescribed" per Dr Beryle Beams. Stated he thought so b/c having more pain. Asked about PA on Eugenio Saenz.  I talked to Regino Schultze - stated med was approved today;called pt - no answer,left message.

## 2017-12-21 NOTE — Telephone Encounter (Signed)
-----   Message from [name], MD sent at 12/21/2017 12:53 PM EST ----- Call pt: sleen larger compared with 2017 study. He needs to take his JAKOFI twice a day as prescribed

## 2017-12-28 ENCOUNTER — Ambulatory Visit (HOSPITAL_COMMUNITY): Payer: BLUE CROSS/BLUE SHIELD

## 2017-12-28 ENCOUNTER — Ambulatory Visit: Payer: BLUE CROSS/BLUE SHIELD

## 2017-12-31 ENCOUNTER — Ambulatory Visit: Payer: BLUE CROSS/BLUE SHIELD

## 2017-12-31 ENCOUNTER — Other Ambulatory Visit: Payer: Self-pay | Admitting: Internal Medicine

## 2017-12-31 DIAGNOSIS — R161 Splenomegaly, not elsewhere classified: Secondary | ICD-10-CM

## 2017-12-31 DIAGNOSIS — C946 Myelodysplastic disease, not classified: Secondary | ICD-10-CM

## 2017-12-31 DIAGNOSIS — D47Z9 Other specified neoplasms of uncertain behavior of lymphoid, hematopoietic and related tissue: Secondary | ICD-10-CM

## 2017-12-31 NOTE — Telephone Encounter (Signed)
Patient is requesting refill on percocet

## 2018-01-01 ENCOUNTER — Telehealth: Payer: Self-pay | Admitting: Internal Medicine

## 2018-01-01 MED ORDER — OXYCODONE-ACETAMINOPHEN 10-325 MG PO TABS: 1.0000 | ORAL_TABLET | Freq: Four times a day (QID) | ORAL | 0 refills | Status: DC | PRN

## 2018-01-01 NOTE — Telephone Encounter (Signed)
Patient provided with 3 days of Pain medication to last him until his scheduled appointment.

## 2018-01-01 NOTE — Telephone Encounter (Signed)
Left message on patient's VM that request has been sent in for 3 days till next appt in [facility] 3/152019 at 9:15. Please see refill encounter from 12/31/2017. Hubbard Hartshorn, RN, BSN

## 2018-01-01 NOTE — Telephone Encounter (Signed)
Patient is calling back regarding medicine, he said that it always refill by the 13th of the month

## 2018-01-04 ENCOUNTER — Ambulatory Visit (INDEPENDENT_AMBULATORY_CARE_PROVIDER_SITE_OTHER): Payer: BLUE CROSS/BLUE SHIELD | Admitting: Internal Medicine

## 2018-01-04 ENCOUNTER — Other Ambulatory Visit: Payer: Self-pay

## 2018-01-04 ENCOUNTER — Encounter: Payer: Self-pay | Admitting: Internal Medicine

## 2018-01-04 VITALS — BP 146/82 | HR 92 | Temp 98.3°F | Ht 72.0 in | Wt 207.0 lb

## 2018-01-04 DIAGNOSIS — I10 Essential (primary) hypertension: Secondary | ICD-10-CM | POA: Diagnosis not present

## 2018-01-04 DIAGNOSIS — D7389 Other diseases of spleen: Secondary | ICD-10-CM

## 2018-01-04 DIAGNOSIS — G8929 Other chronic pain: Secondary | ICD-10-CM

## 2018-01-04 DIAGNOSIS — Z79891 Long term (current) use of opiate analgesic: Secondary | ICD-10-CM

## 2018-01-04 MED ORDER — OXYCODONE-ACETAMINOPHEN 10-325 MG PO TABS: 1.0000 | ORAL_TABLET | Freq: Four times a day (QID) | ORAL | 0 refills | Status: DC | PRN

## 2018-01-04 NOTE — Assessment & Plan Note (Signed)
Patient has been taking his medication and continues to get relief from it. [place] Prescriber Database reviewed and appropriate. Patient attempted to provide a urine sample today but was unable to as he has used the restroom just prior to being seen as he did not know he would need to provide a sample today - Percocet 10-325 q6h, PRN Pain, #60 - Will perform U-Tox at follow up

## 2018-01-04 NOTE — Assessment & Plan Note (Addendum)
BP elevated today at 146/82 . Blood pressure has been elevated during last several visits he was encouraged to start antihypertensive therapy with HCTZ in the past but stated he wanted to think more about it first. Today he states that he is not ready to start as he just got bad news today that his grandmother had another heart attack. He states that he will be willing to talk about starting a blood pressure medication whenever his next visit is scheduled. - Will discuss starting HCTZ 12.5 mg Daily at follow up visit -

## 2018-01-04 NOTE — Progress Notes (Signed)
   CC: Hypertension, Chronic Opioid Analgesic use  HPI:  [name] is a 47 y.o. M with PMHx listed below presenting for Hypertension, Chronic Opioid Analgesic use. Please see the A&P for the status of the patient's chronic medical problems.   Past Medical History:  Diagnosis Date  . Asthma   . Asthma, cold induced   . Erectile dysfunction 06/05/2016  . Herpes   . 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 Myelofibrosis ([place])    followed by Dr. Sherryl Manges  . No pertinent past medical history    enlarged spleen  . Spleen enlargement    myofibrosis need bonemarrow transplant  . Unclassifiable myelodysplastic/myeloproliferative neoplasm ([place]) 11/10/2012   Review of Systems: Performed and all others negative.  Physical Exam:  Vitals:   01/04/18 0927  BP: (!) 146/82  Pulse: 92  Temp: 98.3 F (36.8 C)  TempSrc: Oral  SpO2: 98%  Weight: 207 lb (93.9 kg)  Height: 6' (1.829 m)   Physical Exam  Constitutional: He is oriented to person, place, and time. He appears well-developed and well-nourished.  HENT:  Head: Normocephalic and atraumatic.  Cardiovascular: Normal rate, regular rhythm, normal heart sounds and intact distal pulses.  Pulmonary/Chest: Effort normal and breath sounds normal. No respiratory distress.  Abdominal: Soft. Bowel sounds are normal. He exhibits no distension.  Neurological: He is alert and oriented to person, place, and time.  Skin: Skin is warm and dry.    Assessment & Plan:   See Encounters Tab for problem based charting.  Patient discussed with Dr. Dareen Piano

## 2018-01-04 NOTE — Progress Notes (Signed)
Internal Medicine Clinic Attending  Case discussed with Dr. Melvin  at the time of the visit.  We reviewed the resident's history and exam and pertinent patient test results.  I agree with the assessment, diagnosis, and plan of care documented in the resident's note.  

## 2018-01-04 NOTE — Patient Instructions (Signed)
Thank you for allowing [place] to care for you  For your chronic splenic pain: - Refill of pain medication provided  For your high blood pressure: - We will discuss starting a medication at your next visit

## 2018-01-09 ENCOUNTER — Encounter: Payer: Self-pay | Admitting: *Deleted

## 2018-01-17 NOTE — Addendum Note (Signed)
Addended by: Orson Gear on: 01/17/2018 04:22 PM   Modules accepted: Orders

## 2018-01-31 ENCOUNTER — Other Ambulatory Visit: Payer: Self-pay | Admitting: Internal Medicine

## 2018-01-31 DIAGNOSIS — Z79891 Long term (current) use of opiate analgesic: Secondary | ICD-10-CM

## 2018-01-31 MED ORDER — OXYCODONE-ACETAMINOPHEN 10-325 MG PO TABS: 1.0000 | ORAL_TABLET | Freq: Four times a day (QID) | ORAL | 0 refills | Status: DC | PRN

## 2018-01-31 NOTE — Telephone Encounter (Signed)
Early refill approved to ensure patient will have supple to last him until his next visit.

## 2018-01-31 NOTE — Telephone Encounter (Signed)
Patient is requesting refill percocet, pls send to [place]

## 2018-02-01 ENCOUNTER — Other Ambulatory Visit: Payer: BLUE CROSS/BLUE SHIELD

## 2018-02-01 ENCOUNTER — Other Ambulatory Visit: Payer: Self-pay | Admitting: *Deleted

## 2018-02-01 DIAGNOSIS — Z79891 Long term (current) use of opiate analgesic: Secondary | ICD-10-CM

## 2018-02-01 MED ORDER — OXYCODONE-ACETAMINOPHEN 10-325 MG PO TABS: 1.0000 | ORAL_TABLET | Freq: Four times a day (QID) | ORAL | 0 refills | Status: DC | PRN

## 2018-02-01 NOTE — Telephone Encounter (Signed)
Received fax from [place] - stated unable to fill rx. Rx written 4/11 was sent to [facility] instead of [facility].  Please re-send rx - Thanks

## 2018-02-07 ENCOUNTER — Encounter: Payer: BLUE CROSS/BLUE SHIELD | Admitting: Internal Medicine

## 2018-02-08 ENCOUNTER — Other Ambulatory Visit: Payer: BLUE CROSS/BLUE SHIELD

## 2018-02-11 ENCOUNTER — Other Ambulatory Visit: Payer: BLUE CROSS/BLUE SHIELD

## 2018-02-14 ENCOUNTER — Other Ambulatory Visit: Payer: Self-pay

## 2018-02-14 ENCOUNTER — Encounter: Payer: Self-pay | Admitting: Internal Medicine

## 2018-02-14 ENCOUNTER — Other Ambulatory Visit (INDEPENDENT_AMBULATORY_CARE_PROVIDER_SITE_OTHER): Payer: BLUE CROSS/BLUE SHIELD

## 2018-02-14 ENCOUNTER — Ambulatory Visit (INDEPENDENT_AMBULATORY_CARE_PROVIDER_SITE_OTHER): Payer: BLUE CROSS/BLUE SHIELD | Admitting: Internal Medicine

## 2018-02-14 VITALS — BP 154/91 | HR 90 | Temp 98.1°F | Ht 72.0 in | Wt 207.0 lb

## 2018-02-14 DIAGNOSIS — I10 Essential (primary) hypertension: Secondary | ICD-10-CM

## 2018-02-14 DIAGNOSIS — G8929 Other chronic pain: Secondary | ICD-10-CM

## 2018-02-14 DIAGNOSIS — C946 Myelodysplastic disease, not classified: Secondary | ICD-10-CM

## 2018-02-14 DIAGNOSIS — Z79899 Other long term (current) drug therapy: Secondary | ICD-10-CM | POA: Diagnosis not present

## 2018-02-14 DIAGNOSIS — D47Z9 Other specified neoplasms of uncertain behavior of lymphoid, hematopoietic and related tissue: Secondary | ICD-10-CM

## 2018-02-14 DIAGNOSIS — Z79891 Long term (current) use of opiate analgesic: Secondary | ICD-10-CM | POA: Diagnosis not present

## 2018-02-14 MED ORDER — HYDROCHLOROTHIAZIDE 12.5 MG PO TABS: 12.5000 mg | ORAL_TABLET | Freq: Every day | ORAL | 2 refills | Status: DC

## 2018-02-14 MED ORDER — OXYCODONE-ACETAMINOPHEN 10-325 MG PO TABS: 1.0000 | ORAL_TABLET | Freq: Four times a day (QID) | ORAL | 0 refills | Status: DC | PRN

## 2018-02-14 NOTE — Patient Instructions (Addendum)
Thank you for allowing me to care for you  For your high blood pressure - Start taking HCTZ 12.5mg  Daily - Follow up in 2-3 weeks for recheck and labs  For your chronic pain - A urine sample was taken today - Your pain medication has been refilled for 3 months  Follow up in 2 weeks for BP and 3 months for check up

## 2018-02-14 NOTE — Assessment & Plan Note (Addendum)
Patient has been taking his medication and continues to get relief from it. [place] prescriber database reviewed and appropriate. He was unable to provide urine sample at last visit, will obtain sample today. Patient states he has taken medication in the last 24-48 hours.  - Percocet 10-325 q6h PRN Pain #60, x3 prescriptions to be filled 30 days apart - U-Tox

## 2018-02-14 NOTE — Progress Notes (Signed)
   CC: Hypertension, Chronic Opioid Analgesic Use  HPI:  [name] is a 47 y.o. M with PMHx listed below presenting for Hypertension, Chronic Opioid Analgesic Use. Please see the A&P for the status of the patient's chronic medical problems.   Past Medical History:  Diagnosis Date  . Asthma   . Asthma, cold induced   . Erectile dysfunction 06/05/2016  . Herpes   . 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 Myelofibrosis ([place])    followed by Dr. Sherryl Manges  . No pertinent past medical history    enlarged spleen  . Spleen enlargement    myofibrosis need bonemarrow transplant  . Unclassifiable myelodysplastic/myeloproliferative neoplasm ([place]) 11/10/2012   Review of Systems: Performed and all others negative.  Physical Exam: Performed and all others negative.  Vitals:   02/14/18 1512  BP: (!) 154/91  Pulse: 90  Temp: 98.1 F (36.7 C)  SpO2: 100%  Weight: 207 lb (93.9 kg)  Height: 6' (1.829 m)   Physical Exam  Constitutional: He appears well-developed and well-nourished. No distress.  Cardiovascular: Normal rate, regular rhythm, normal heart sounds and intact distal pulses.  Pulmonary/Chest: Effort normal and breath sounds normal. No respiratory distress.  Abdominal: Soft. Bowel sounds are normal. He exhibits no distension. There is no tenderness.  Musculoskeletal: He exhibits no edema or deformity.  Skin: Skin is warm and dry.   Assessment & Plan:   See Encounters Tab for problem based charting.  Patient discussed with Dr. Evette Doffing

## 2018-02-14 NOTE — Assessment & Plan Note (Signed)
BP today elevated at 154/91. BP has been elevated for past several visit, but patient requested more time to think about starting an antihypertensive. Today he states he is ready to start a medication. We will be starting the patient on Hydrochlorothiazide 12.5mg  Daily and have him follow up for labs and a recheck. - HCTZ 12.5mg  Daily - Follow up in 2-3 weeks for BP check and BMP - Follow up in 3 months for check up

## 2018-02-15 LAB — CBC WITH DIFFERENTIAL/PLATELET
BASOS ABS: 0.1 10*3/uL (ref 0.0–0.2)
Basos: 1 %
EOS (ABSOLUTE): 0.1 10*3/uL (ref 0.0–0.4)
Eos: 1 %
HEMOGLOBIN: 14.9 g/dL (ref 13.0–17.7)
Hematocrit: 44.3 % (ref 37.5–51.0)
IMMATURE GRANS (ABS): 0 10*3/uL (ref 0.0–0.1)
Immature Granulocytes: 0 %
LYMPHS ABS: 0.6 10*3/uL — AB (ref 0.7–3.1)
LYMPHS: 6 %
MCH: 27 pg (ref 26.6–33.0)
MCHC: 33.6 g/dL (ref 31.5–35.7)
MCV: 80 fL (ref 79–97)
Monocytes Absolute: 0.2 10*3/uL (ref 0.1–0.9)
Monocytes: 2 %
Neutrophils Absolute: 9.5 10*3/uL — ABNORMAL HIGH (ref 1.4–7.0)
Neutrophils: 90 %
Platelets: 294 10*3/uL (ref 150–379)
RBC: 5.52 x10E6/uL (ref 4.14–5.80)
RDW: 16.9 % — ABNORMAL HIGH (ref 12.3–15.4)
WBC: 10.5 10*3/uL (ref 3.4–10.8)

## 2018-02-15 LAB — COMPREHENSIVE METABOLIC PANEL
ALK PHOS: 93 IU/L (ref 39–117)
ALT: 28 IU/L (ref 0–44)
AST: 23 IU/L (ref 0–40)
Albumin/Globulin Ratio: 1.5 (ref 1.2–2.2)
Albumin: 4.3 g/dL (ref 3.5–5.5)
BILIRUBIN TOTAL: 0.2 mg/dL (ref 0.0–1.2)
BUN/Creatinine Ratio: 11 (ref 9–20)
BUN: 12 mg/dL (ref 6–24)
CHLORIDE: 102 mmol/L (ref 96–106)
CO2: 24 mmol/L (ref 20–29)
Calcium: 8.7 mg/dL (ref 8.7–10.2)
Creatinine, Ser: 1.09 mg/dL (ref 0.76–1.27)
GFR calc Af Amer: 94 mL/min/{1.73_m2} (ref >59)
GFR calc non Af Amer: 81 mL/min/{1.73_m2} (ref >59)
GLUCOSE: 73 mg/dL (ref 65–99)
Globulin, Total: 2.8 g/dL (ref 1.5–4.5)
Potassium: 4.5 mmol/L (ref 3.5–5.2)
Sodium: 140 mmol/L (ref 134–144)
Total Protein: 7.1 g/dL (ref 6.0–8.5)

## 2018-02-15 LAB — LACTATE DEHYDROGENASE: LDH: 366 IU/L — AB (ref 121–224)

## 2018-02-15 NOTE — Progress Notes (Signed)
Internal Medicine Clinic Attending  Case discussed with Dr. Melvin  at the time of the visit.  We reviewed the resident's history and exam and pertinent patient test results.  I agree with the assessment, diagnosis, and plan of care documented in the resident's note.  

## 2018-02-18 ENCOUNTER — Ambulatory Visit: Payer: BLUE CROSS/BLUE SHIELD | Admitting: Oncology

## 2018-02-19 ENCOUNTER — Telehealth: Payer: Self-pay | Admitting: *Deleted

## 2018-02-19 NOTE — Telephone Encounter (Signed)
-----   Message from [name], MD sent at 02/17/2018 10:53 AM EDT ----- Call pt: lab all good

## 2018-02-19 NOTE — Telephone Encounter (Signed)
Called pt - no answer; left message "lab all good" per Dr Beryle Beams. And call for any questions.

## 2018-02-21 LAB — TOXASSURE SELECT,+ANTIDEPR,UR

## 2018-02-22 ENCOUNTER — Telehealth: Payer: Self-pay

## 2018-02-22 NOTE — Telephone Encounter (Signed)
rtc to pt, no answer, no vmail

## 2018-02-22 NOTE — Telephone Encounter (Signed)
Pt rtc, his speech was "MUMBLY", understood only that he wanted more pain meds before due, offered appt, refused, stated he might not get out of bed, ask him to call 911, he hung up. Ask glenda to call pt, worried that he is having issues that need emergent care, will send to dr Beryle Beams also and dr Trilby Drummer

## 2018-02-22 NOTE — Telephone Encounter (Signed)
From the notes he should not be out of pain medication, I agree with increased pain he needs to be evaluated likely in the ED.

## 2018-02-22 NOTE — Telephone Encounter (Signed)
Needs to speak with a nurse about pain med. Please call pt back.  

## 2018-02-22 NOTE — Telephone Encounter (Signed)
Called pt after talking to [facility] - pt stated he's having more pain ; had mentioned this to Dr Trilby Drummer at last visit.  Stated out of pain med. Speech is slightly mumbled at times; informed if having more pain, needs to be seen -stated he has his daughter w/him and his wife will not be back until 3 PM. Informed needs to go to the ED if he cannot to the office -stated he would. Stated no other problems except for the pain.

## 2018-02-27 NOTE — Telephone Encounter (Signed)
Always concerned he is over-using pain meds. Did anyone ask him where he is having pain? I prescribe a limited amount each month to control pain he relates to his enlarged spleen. He should not get narcotics from [place] for any other reason. He has not called this week. DrG

## 2018-02-27 NOTE — Telephone Encounter (Signed)
When seen in clinic he reported the same pain related to his spleen that he has been having, but feeling like he needed the pain medicine more frequently at times when his work at [employer] aggravated the pain.

## 2018-02-28 ENCOUNTER — Other Ambulatory Visit: Payer: Self-pay | Admitting: Oncology

## 2018-02-28 DIAGNOSIS — Z79891 Long term (current) use of opiate analgesic: Secondary | ICD-10-CM

## 2018-02-28 MED ORDER — OXYCODONE-ACETAMINOPHEN 10-325 MG PO TABS: 1.0000 | ORAL_TABLET | Freq: Four times a day (QID) | ORAL | 0 refills | Status: DC | PRN

## 2018-03-01 ENCOUNTER — Ambulatory Visit: Payer: BLUE CROSS/BLUE SHIELD

## 2018-03-08 ENCOUNTER — Ambulatory Visit: Payer: BLUE CROSS/BLUE SHIELD

## 2018-03-08 ENCOUNTER — Ambulatory Visit (INDEPENDENT_AMBULATORY_CARE_PROVIDER_SITE_OTHER): Payer: BLUE CROSS/BLUE SHIELD | Admitting: Internal Medicine

## 2018-03-08 ENCOUNTER — Other Ambulatory Visit: Payer: Self-pay

## 2018-03-08 VITALS — BP 153/96 | HR 113 | Temp 98.7°F | Ht 73.0 in | Wt 195.7 lb

## 2018-03-08 DIAGNOSIS — I10 Essential (primary) hypertension: Secondary | ICD-10-CM | POA: Diagnosis not present

## 2018-03-08 DIAGNOSIS — F17210 Nicotine dependence, cigarettes, uncomplicated: Secondary | ICD-10-CM | POA: Diagnosis not present

## 2018-03-08 DIAGNOSIS — R000 Tachycardia, unspecified: Secondary | ICD-10-CM

## 2018-03-08 DIAGNOSIS — Z79899 Other long term (current) drug therapy: Secondary | ICD-10-CM | POA: Diagnosis not present

## 2018-03-08 NOTE — Assessment & Plan Note (Addendum)
BP Readings from Last 3 Encounters:  03/08/18 (!) 153/96  02/14/18 (!) 154/91  01/04/18 (!) 146/82   Patient reluctantly started hydrochlorothiazide 12.5 mg daily about 3 weeks prior.  His blood pressure today remains elevated.  The measurement was repeated twice while he was here and it was consistently high.  The subject of adding another medication versus increasing his dosage was brought up and the patient was very reluctant.  He asked me if there are any other options.  In going over his diet it seems that he eats a lot of pork and salty foods.    -Continue HCTZ 12.5 mg daily -Gave patient a copy of the DASH diet explained basic premise, he was happy to get this diet and is motivated to try dietary changes to reduce blood pressure

## 2018-03-08 NOTE — Progress Notes (Signed)
CC:  HTN follow up  HPI:  [name] is a 47 y.o. male with PMH below.  He is here after beginning HCTZ for HTN.  Please see A&P for status of the patient's chronic medical conditions  Past Medical History:  Diagnosis Date  . Asthma   . Asthma, cold induced   . Erectile dysfunction 06/05/2016  . Herpes   . 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 Myelofibrosis ([place])    followed by Dr. Sherryl Manges  . No pertinent past medical history    enlarged spleen  . Spleen enlargement    myofibrosis need bonemarrow transplant  . 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  Eyes: Right eye exhibits no discharge. Left eye exhibits no discharge. No scleral icterus.  Cardiovascular: Regular rhythm, normal heart sounds and intact distal pulses. Tachycardia present. Exam reveals no gallop and no friction rub.  No murmur heard. Pulmonary/Chest: Effort normal and breath sounds normal. No respiratory distress. He has no wheezes. He has no rales.  Abdominal: Soft. Bowel sounds are normal. He exhibits no distension and no mass. There is no tenderness. There is no guarding.  Neurological: He is alert.    Social History   Socioeconomic History  . Marital status: Single    Spouse name: Not on file  . Number of children: Not on file  . Years of education: Not on file  . Highest education level: Not on file  Occupational History  . Not on file  Social Needs  . Financial resource strain: Not on file    . Food insecurity:    Worry: Not on file    Inability: Not on file  . Transportation needs:    Medical: Not on file    Non-medical: Not on file  Tobacco Use  . Smoking status: Current Some Day Smoker    Packs/day: 0.30    Types: Cigarettes  . Smokeless tobacco: Never Used  . Tobacco comment: 2-3  cigs per day  Substance and Sexual Activity  . Alcohol use: Yes    Alcohol/week: 0.0 oz    Comment: daily   . Drug use: No  . Sexual activity: Yes    Partners: Female  Lifestyle  . Physical activity:    Days per week: Not on file    Minutes per session: Not on file  . Stress: Not on file  Relationships  . Social connections:    Talks on phone: Not on file    Gets together: Not on file    Attends religious service: Not on file    Active member of club or organization: Not on file    Attends meetings of clubs or organizations: Not on file    Relationship status: Not on file  . Intimate partner violence:    Fear of current or ex partner: Not on file    Emotionally abused: Not on file    Physically abused: Not on file    Forced sexual activity: Not on file  Other Topics Concern  .  Not on file  Social History Narrative  . Not on file    No family history on file.  Assessment & Plan:   See Encounters Tab for problem based charting.  Patient discussed with Dr. Angelia Mould

## 2018-03-08 NOTE — Patient Instructions (Addendum)
Sean Walter, good to see you today.  I understand your reluctance to start another blood pressure medication or increase the dosage.  We will try these dietary changes to see if we can lower the blood pressure more that way and have you come in for a follow up appointment.    DASH Eating Plan DASH stands for "Dietary Approaches to Stop Hypertension." The DASH eating plan is a healthy eating plan that has been shown to reduce high blood pressure (hypertension). It may also reduce your risk for type 2 diabetes, heart disease, and stroke. The DASH eating plan may also help with weight loss. What are tips for following this plan? General guidelines  Avoid eating more than 2,300 mg (milligrams) of salt (sodium) a day. If you have hypertension, you may need to reduce your sodium intake to 1,500 mg a day.  Limit alcohol intake to no more than 1 drink a day for nonpregnant women and 2 drinks a day for men. One drink equals 12 oz of beer, 5 oz of wine, or 1 oz of hard liquor.  Work with your health care provider to maintain a healthy body weight or to lose weight. Ask what an ideal weight is for you.  Get at least 30 minutes of exercise that causes your heart to beat faster (aerobic exercise) most days of the week. Activities may include walking, swimming, or biking.  Work with your health care provider or diet and nutrition specialist (dietitian) to adjust your eating plan to your individual calorie needs. Reading food labels  Check food labels for the amount of sodium per serving. Choose foods with less than 5 percent of the Daily Value of sodium. Generally, foods with less than 300 mg of sodium per serving fit into this eating plan.  To find whole grains, look for the word "whole" as the first word in the ingredient list. Shopping  Buy products labeled as "low-sodium" or "no salt added."  Buy fresh foods. Avoid canned foods and premade or frozen meals. Cooking  Avoid adding salt when cooking.  Use salt-free seasonings or herbs instead of table salt or sea salt. Check with your health care provider or pharmacist before using salt substitutes.  Do not fry foods. Cook foods using healthy methods such as baking, boiling, grilling, and broiling instead.  Cook with heart-healthy oils, such as olive, canola, soybean, or sunflower oil. Meal planning   Eat a balanced diet that includes: ? 5 or more servings of fruits and vegetables each day. At each meal, try to fill half of your plate with fruits and vegetables. ? Up to 6-8 servings of whole grains each day. ? Less than 6 oz of lean meat, poultry, or fish each day. A 3-oz serving of meat is about the same size as a deck of cards. One egg equals 1 oz. ? 2 servings of low-fat dairy each day. ? A serving of nuts, seeds, or beans 5 times each week. ? Heart-healthy fats. Healthy fats called Omega-3 fatty acids are found in foods such as flaxseeds and coldwater fish, like sardines, salmon, and mackerel.  Limit how much you eat of the following: ? Canned or prepackaged foods. ? Food that is high in trans fat, such as fried foods. ? Food that is high in saturated fat, such as fatty meat. ? Sweets, desserts, sugary drinks, and other foods with added sugar. ? Full-fat dairy products.  Do not salt foods before eating.  Try to eat at least 2 vegetarian  meals each week.  Eat more home-cooked food and less restaurant, buffet, and fast food.  When eating at a restaurant, ask that your food be prepared with less salt or no salt, if possible. What foods are recommended? The items listed may not be a complete list. Talk with your dietitian about what dietary choices are best for you. Grains Whole-grain or whole-wheat bread. Whole-grain or whole-wheat pasta. Brown rice. [place]row. Bulgur. Whole-grain and low-sodium cereals. Pita bread. Low-fat, low-sodium crackers. Whole-wheat flour tortillas. Vegetables Fresh or frozen vegetables (raw,  steamed, roasted, or grilled). Low-sodium or reduced-sodium tomato and vegetable juice. Low-sodium or reduced-sodium tomato sauce and tomato paste. Low-sodium or reduced-sodium canned vegetables. Fruits All fresh, dried, or frozen fruit. Canned fruit in natural juice (without added sugar). Meat and other protein foods Skinless chicken or [place]. Ground chicken or [place]. Pork with fat trimmed off. Fish and seafood. Egg whites. Dried beans, peas, or lentils. Unsalted nuts, nut butters, and seeds. Unsalted canned beans. Lean cuts of beef with fat trimmed off. Low-sodium, lean deli meat. Dairy Low-fat (1%) or fat-free (skim) milk. Fat-free, low-fat, or reduced-fat cheeses. Nonfat, low-sodium ricotta or cottage cheese. Low-fat or nonfat yogurt. Low-fat, low-sodium cheese. Fats and oils Soft margarine without trans fats. Vegetable oil. Low-fat, reduced-fat, or light mayonnaise and salad dressings (reduced-sodium). Canola, safflower, olive, soybean, and sunflower oils. Avocado. Seasoning and other foods Herbs. Spices. Seasoning mixes without salt. Unsalted popcorn and pretzels. Fat-free sweets. What foods are not recommended? The items listed may not be a complete list. Talk with your dietitian about what dietary choices are best for you. Grains Baked goods made with fat, such as croissants, muffins, or some breads. Dry pasta or rice meal packs. Vegetables Creamed or fried vegetables. Vegetables in a cheese sauce. Regular canned vegetables (not low-sodium or reduced-sodium). Regular canned tomato sauce and paste (not low-sodium or reduced-sodium). Regular tomato and vegetable juice (not low-sodium or reduced-sodium). Angie Fava. Olives. Fruits Canned fruit in a light or heavy syrup. Fried fruit. Fruit in cream or butter sauce. Meat and other protein foods Fatty cuts of meat. Ribs. Fried meat. Berniece Salines. Sausage. Bologna and other processed lunch meats. Salami. Fatback. Hotdogs. Bratwurst. Salted nuts and  seeds. Canned beans with added salt. Canned or smoked fish. Whole eggs or egg yolks. Chicken or [place] with skin. Dairy Whole or 2% milk, cream, and half-and-half. Whole or full-fat cream cheese. Whole-fat or sweetened yogurt. Full-fat cheese. Nondairy creamers. Whipped toppings. Processed cheese and cheese spreads. Fats and oils Butter. Stick margarine. Lard. Shortening. Ghee. Bacon fat. Tropical oils, such as coconut, palm kernel, or palm oil. Seasoning and other foods Salted popcorn and pretzels. Onion salt, garlic salt, seasoned salt, table salt, and sea salt. Worcestershire sauce. Tartar sauce. Barbecue sauce. Teriyaki sauce. Soy sauce, including reduced-sodium. Steak sauce. Canned and packaged gravies. Fish sauce. Oyster sauce. Cocktail sauce. Horseradish that you find on the shelf. Ketchup. Mustard. Meat flavorings and tenderizers. Bouillon cubes. Hot sauce and Tabasco sauce. Premade or packaged marinades. Premade or packaged taco seasonings. Relishes. Regular salad dressings. Where to find more information:  National Heart, Lung, and [place]: https://wilson-eaton.com/  American Heart Association: www.heart.org Summary  The DASH eating plan is a healthy eating plan that has been shown to reduce high blood pressure (hypertension). It may also reduce your risk for type 2 diabetes, heart disease, and stroke.  With the DASH eating plan, you should limit salt (sodium) intake to 2,300 mg a day. If you have hypertension, you may need to  reduce your sodium intake to 1,500 mg a day.  When on the DASH eating plan, aim to eat more fresh fruits and vegetables, whole grains, lean proteins, low-fat dairy, and heart-healthy fats.  Work with your health care provider or diet and nutrition specialist (dietitian) to adjust your eating plan to your individual calorie needs. This information is not intended to replace advice given to you by your health care provider. Make sure you discuss any questions you  have with your health care provider. Document Released: 09/28/2011 Document Revised: 10/02/2016 Document Reviewed: 10/02/2016 Elsevier Interactive Patient Education  [employer].

## 2018-03-11 NOTE — Progress Notes (Signed)
Internal Medicine Clinic Attending  Case discussed with Dr. Winfrey  at the time of the visit.  We reviewed the resident's history and exam and pertinent patient test results.  I agree with the assessment, diagnosis, and plan of care documented in the resident's note.  

## 2018-04-04 ENCOUNTER — Other Ambulatory Visit (HOSPITAL_COMMUNITY)
Admission: RE | Admit: 2018-04-04 | Discharge: 2018-04-04 | Disposition: A | Discharge status: Home / Self Care | Payer: BLUE CROSS/BLUE SHIELD | Source: Ambulatory Visit | Attending: Internal Medicine | Admitting: Internal Medicine

## 2018-04-04 ENCOUNTER — Encounter (INDEPENDENT_AMBULATORY_CARE_PROVIDER_SITE_OTHER): Payer: Self-pay

## 2018-04-04 ENCOUNTER — Encounter: Payer: Self-pay | Admitting: Internal Medicine

## 2018-04-04 ENCOUNTER — Ambulatory Visit (INDEPENDENT_AMBULATORY_CARE_PROVIDER_SITE_OTHER): Payer: BLUE CROSS/BLUE SHIELD | Admitting: Internal Medicine

## 2018-04-04 VITALS — BP 144/92 | HR 99 | Temp 98.8°F | Ht 73.0 in | Wt 200.0 lb

## 2018-04-04 DIAGNOSIS — Z113 Encounter for screening for infections with a predominantly sexual mode of transmission: Secondary | ICD-10-CM

## 2018-04-04 DIAGNOSIS — D47Z9 Other specified neoplasms of uncertain behavior of lymphoid, hematopoietic and related tissue: Secondary | ICD-10-CM

## 2018-04-04 DIAGNOSIS — K219 Gastro-esophageal reflux disease without esophagitis: Secondary | ICD-10-CM | POA: Insufficient documentation

## 2018-04-04 DIAGNOSIS — C946 Myelodysplastic disease, not classified: Secondary | ICD-10-CM

## 2018-04-04 HISTORY — DX: Encounter for screening for infections with a predominantly sexual mode of transmission: Z11.3

## 2018-04-04 MED ORDER — PANTOPRAZOLE SODIUM 40 MG PO TBEC: 40.0000 mg | DELAYED_RELEASE_TABLET | Freq: Two times a day (BID) | ORAL | 0 refills | Status: DC

## 2018-04-04 NOTE — Assessment & Plan Note (Signed)
She requesting screening for STDs today.  He reports to new male partners in the past 6 months.  Ports he does not use condoms.  He denies any penile discharge or rashes.  Neither of these partners have a history of STDs that he is aware of.  Testing for HIV, RPR, gonorrhea, chlamydia, trichomonas today.

## 2018-04-04 NOTE — Assessment & Plan Note (Signed)
She due for labs per Dr. Azucena Freed is orders.  Labs drawn today.

## 2018-04-04 NOTE — Patient Instructions (Signed)
Sean Walter,  Will discharge on twice a day Protonix for the next month to help with acid suppression.  We will also test for H. pylori today.  We will also get a screen you for sexually transmitted diseases.  These results should be back in the next day or so and I will call you with results.

## 2018-04-04 NOTE — Assessment & Plan Note (Signed)
Patient with complaints of reflux symptoms for the past 5 days.  He reports one episode is similar in the past but resolved on its own after 1 or 2 days.  He notes that he has had burning from epigastric to mid throat pain.  This is worse whenever he eats or drinks anything as well as when he lays down flat.  He also notes frequent belching.  Reports the pain is a burning sensation and denies any shortness of breath or pain with exertion.  He tried Nexium which did provide some relief but is since discontinued it.  Denies any difficulty swallowing, pain with swallowing, nausea, abdominal pain outside of the epigastric area.  He does note 2 episodes of vomiting after drinking alcohol.  No hematemesis, melena, or hematochezia.  He denies any NSAID use.    Throat is clear without any evidence of thrush on exam.  No abdominal tenderness.  Assessment and plan: We will check serology for H. pylori today as he has had recent PPI use that may affect stool testing.  We will give scription for twice daily Protonix use.  Does report some recent unprotected sex and wishes to be tested for HIV and we will check this today.

## 2018-04-04 NOTE — Progress Notes (Signed)
   CC: heartburn  HPI:  [name] is a 47 y.o. male with a past medical history listed below here today with complaints of heartburn.  For details of today's visit and the status of his chronic medical issues please refer to the assessment and plan.   Past Medical History:  Diagnosis Date  . Asthma   . Asthma, cold induced   . Erectile dysfunction 06/05/2016  . Herpes   . 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 Myelofibrosis (Sean Walter)    followed by Dr. Sherryl Walter  . No pertinent past medical history    enlarged spleen  . Spleen enlargement    myofibrosis need bonemarrow transplant  . Unclassifiable myelodysplastic/myeloproliferative neoplasm (Sean Walter) 11/10/2012   Review of Systems:  No chest pain or shortness of breath  Physical Exam:  Vitals:   04/04/18 1018  BP: (!) 144/92  Pulse: 99  Temp: 98.8 F (37.1 C)  TempSrc: Oral  SpO2: 98%  Weight: 200 lb (90.7 kg)  Height: 6\' 1"  (1.854 m)    Physical Exam  Constitutional: He is well-developed, well-nourished, and in no distress. No distress.  HENT:  Head: Normocephalic and atraumatic.  Nose: Nose normal.  Mouth/Throat: Oropharynx is clear and moist. No oropharyngeal exudate.  Cardiovascular: Normal rate and regular rhythm.  Pulmonary/Chest: Effort normal and breath sounds normal.  Abdominal: Soft. Bowel sounds are normal. He exhibits no distension. There is no tenderness.  Skin: Skin is warm and dry.  Psychiatric: Mood and affect normal.    Assessment & Plan:   See Encounters Tab for problem based charting.  Patient discussed with Dr. Dareen Walter

## 2018-04-05 ENCOUNTER — Other Ambulatory Visit: Payer: Self-pay | Admitting: Oncology

## 2018-04-05 DIAGNOSIS — Z79891 Long term (current) use of opiate analgesic: Secondary | ICD-10-CM

## 2018-04-05 LAB — HIV ANTIBODY (ROUTINE TESTING W REFLEX): HIV SCREEN 4TH GENERATION: NONREACTIVE

## 2018-04-05 LAB — URINE CYTOLOGY ANCILLARY ONLY
Chlamydia: NEGATIVE
Neisseria Gonorrhea: NEGATIVE
Trichomonas: NEGATIVE

## 2018-04-05 LAB — RPR: RPR: NONREACTIVE

## 2018-04-06 LAB — COMPREHENSIVE METABOLIC PANEL
ALBUMIN: 4.3 g/dL (ref 3.5–5.5)
ALT: 15 IU/L (ref 0–44)
AST: 18 IU/L (ref 0–40)
Albumin/Globulin Ratio: 1.4 (ref 1.2–2.2)
Alkaline Phosphatase: 93 IU/L (ref 39–117)
BILIRUBIN TOTAL: 0.4 mg/dL (ref 0.0–1.2)
BUN / CREAT RATIO: 5 — AB (ref 9–20)
BUN: 5 mg/dL — AB (ref 6–24)
CHLORIDE: 96 mmol/L (ref 96–106)
CO2: 25 mmol/L (ref 20–29)
CREATININE: 1.04 mg/dL (ref 0.76–1.27)
Calcium: 9.2 mg/dL (ref 8.7–10.2)
GFR calc Af Amer: 99 mL/min/{1.73_m2} (ref >59)
GFR calc non Af Amer: 86 mL/min/{1.73_m2} (ref >59)
GLUCOSE: 91 mg/dL (ref 65–99)
Globulin, Total: 3 g/dL (ref 1.5–4.5)
Potassium: 3.6 mmol/L (ref 3.5–5.2)
Sodium: 137 mmol/L (ref 134–144)
TOTAL PROTEIN: 7.3 g/dL (ref 6.0–8.5)

## 2018-04-06 LAB — CBC WITH DIFFERENTIAL/PLATELET
BASOS ABS: 0 10*3/uL (ref 0.0–0.2)
Basos: 0 %
EOS (ABSOLUTE): 0.1 10*3/uL (ref 0.0–0.4)
EOS: 1 %
HEMATOCRIT: 42.6 % (ref 37.5–51.0)
HEMOGLOBIN: 14.3 g/dL (ref 13.0–17.7)
IMMATURE GRANS (ABS): 0 10*3/uL (ref 0.0–0.1)
Immature Granulocytes: 0 %
LYMPHS ABS: 0.5 10*3/uL — AB (ref 0.7–3.1)
LYMPHS: 5 %
MCH: 28.3 pg (ref 26.6–33.0)
MCHC: 33.6 g/dL (ref 31.5–35.7)
MCV: 84 fL (ref 79–97)
MONOCYTES: 3 %
Monocytes Absolute: 0.3 10*3/uL (ref 0.1–0.9)
Neutrophils Absolute: 9.4 10*3/uL — ABNORMAL HIGH (ref 1.4–7.0)
Neutrophils: 91 %
Platelets: 338 10*3/uL (ref 150–450)
RBC: 5.06 x10E6/uL (ref 4.14–5.80)
RDW: 16.1 % — ABNORMAL HIGH (ref 12.3–15.4)
WBC: 10.4 10*3/uL (ref 3.4–10.8)

## 2018-04-06 LAB — H PYLORI, IGM, IGG, IGA AB
H pylori, IgM Abs: <9 units (ref 0.0–8.9)
H. pylori, IgA Abs: <9 units (ref 0.0–8.9)
H. pylori, IgG AbS: <0.8 Index Value (ref 0.00–0.79)

## 2018-04-06 LAB — LACTATE DEHYDROGENASE: LDH: 389 IU/L — AB (ref 121–224)

## 2018-04-08 ENCOUNTER — Other Ambulatory Visit: Payer: BLUE CROSS/BLUE SHIELD

## 2018-04-08 NOTE — Progress Notes (Signed)
Internal Medicine Clinic Attending  Case discussed with Dr. Boswell at the time of the visit.  We reviewed the resident's history and exam and pertinent patient test results.  I agree with the assessment, diagnosis, and plan of care documented in the resident's note.  

## 2018-04-12 ENCOUNTER — Other Ambulatory Visit: Payer: BLUE CROSS/BLUE SHIELD

## 2018-04-16 ENCOUNTER — Ambulatory Visit (INDEPENDENT_AMBULATORY_CARE_PROVIDER_SITE_OTHER): Payer: BLUE CROSS/BLUE SHIELD | Admitting: Oncology

## 2018-04-16 ENCOUNTER — Other Ambulatory Visit: Payer: Self-pay

## 2018-04-16 ENCOUNTER — Encounter: Payer: Self-pay | Admitting: Oncology

## 2018-04-16 VITALS — BP 153/89 | HR 88 | Temp 98.1°F | Ht 73.0 in | Wt 198.4 lb

## 2018-04-16 DIAGNOSIS — C946 Myelodysplastic disease, not classified: Secondary | ICD-10-CM

## 2018-04-16 DIAGNOSIS — D47Z9 Other specified neoplasms of uncertain behavior of lymphoid, hematopoietic and related tissue: Secondary | ICD-10-CM

## 2018-04-16 DIAGNOSIS — R161 Splenomegaly, not elsewhere classified: Secondary | ICD-10-CM

## 2018-04-16 DIAGNOSIS — Z79891 Long term (current) use of opiate analgesic: Secondary | ICD-10-CM

## 2018-04-16 DIAGNOSIS — Z886 Allergy status to analgesic agent status: Secondary | ICD-10-CM

## 2018-04-16 NOTE — Progress Notes (Signed)
Hematology and Oncology Follow Up Visit  Sean Walter [id number] [birth year] 47 y.o. 04/16/2018 11:46 AM   Principle Diagnosis: Encounter Diagnosis  Name Primary?  Sean Walter Kitchen Unclassifiable myelodysplastic/myeloproliferative neoplasm (Sean Walter) Yes  Updated clinical summary: 47year-old man who was initially diagnosed as having myelofibrosis but who more likely has a non-BCR-ABL myeloproliferative disorder. He presented with leukocytosis and massive splenomegaly in January 2012. Bone marrow biopsy done in 11/16/10 was hypercellular with prominent proliferation of megakaryocytes many of which had abnormal morphology. Collagen and reticulin stains were focally positive. There were no excess blasts; in fact, on a 500 cell count differential, 0% blasts were recorded. Routine cytogenetics were normal but FISH probes were not done. Peripheral blood was negative for the presence of BCR-ABL transcripts. He isJAK-2 gene mutation positive. (03/07/13) The patient was started on JAKOFI in February, 2012. Current dose is 20 mg twice daily. He has been on doses as high as 25 mg twice daily.  I have followed him since July of 2013. Blood counts initially stable and there was a significant decrease in his spleen size although he continued to complain of pain and requests Percocet on a regular basis.  He developed cumulative myelotoxicity requiring a dose reduction from 25 mg twice daily down to 20 mg twice daily in late August 2015. There was a discrepancy in my notes where I said I was decreasing him to 50 mg twice daily in August 2015. Itcame to my attention that the patient was actually only taking 10 mg twice daily. It is no surprise that his blood counts completely recovered but also that his spleen started to enlarge again at time;ultrasound done on 01/08/2015 with spleen dimensions 17 x 6 x 17 cm and volume 1189 mL cubed. I wrote a new prescription for 20 mg twice daily which he started taking on 01/31/2015.  Blood counts stabilized. Ultrasound of the spleen done on 07/08/2015 showed spleen size mildly decreased compared with the March study now measuring 16 x 5 x 17 cm.  ultrasound done 06/30/2016: Spleen 18.3 x 16.2 x 4.9 cm. 758 mm.  Most recent ultrasound December 21, 2017 maximum splenic dimension 19 cm.  He has become habituated to oxycodone/acetominophen to control LUQ abdominal pain associated with his splenomegaly. 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 I have been able to taper him down to 60 tablets per month.   Interim History:   Overall doing well.  Both of his sons are now in [place] and he got them jobs at the [employer] where he works. He did reach out to [facility] for a follow-up appointment but still has not scheduled this.  He always seems to have some excuse.  1 of his sons who is a potential donor just got recruited from into the [employer] and Mr. Sean Walter now has to coordinate his schedule.  He also states that his car broke down and he does not have reliable transport to do. I told him that he has been extremely fortunate with his blood disorder since it is been stable since diagnosis but that things could change very quickly and he cannot afford to keep delaying a reevaluation at [facility]. I also had to bring up another delicate subject.  He just saw his primary care physician last month who did a urine drug screen which tested positive for cocaine.  I confronted him with this.  He admitted to partying with his sons and his friend.  I told him this was unacceptable since we were prescribing another narcotic  and the interaction between the 2 drugs could cause major complications.  Medications: reviewed  Allergies:  Allergies  Allergen Reactions  . Ibuprofen Other (See Comments)    Indigestion Indigestion    Review of Systems: See interim history Remaining ROS negative:   Physical Exam: Blood pressure (!) 153/89, pulse 88, temperature 98.1 F (36.7 C), temperature source Oral, height '6\' 1"'$   (1.854 m), weight 198 lb 6.4 oz (90 kg), SpO2 100 %. Wt Readings from Last 3 Encounters:  04/16/18 198 lb 6.4 oz (90 kg)  04/04/18 200 lb (90.7 kg)  03/08/18 195 lb 11.2 oz (88.8 kg)     General appearance: Muscular African-American man Sean Walter: Head shaved bald.  Pharynx no erythema, exudate, mass, or ulcer. No thyromegaly or thyroid nodules Lymph nodes: No cervical, supraclavicular, or axillary lymphadenopathy Breasts: Lungs: Clear to auscultation, resonant to percussion throughout Heart: Regular rhythm, no murmur, no gallop, no rub, no click, no edema Abdomen: Soft, , normal bowel sounds, no mass, spleen palpable about 5 cm below left costal margin and tender Extremities: No edema, no calf tenderness Musculoskeletal: no joint deformities GU:  Vascular: Carotid pulses 2+, no bruits, Neurologic: Alert, oriented, PERRLA, cranial nerves grossly normal, motor strength 5 over 5, reflexes 1+ symmetric, upper body coordination normal, gait normal, Skin: No rash or ecchymosis (complete resolution of previous molluscum contagiosum rash on the scalp and neck)  Lab Results: CBC W/Diff    Component Value Date/Time   WBC 10.4 04/04/2018 1051   WBC 8.7 12/18/2017 0937   RBC 5.06 04/04/2018 1051   RBC 6.39 (H) 12/18/2017 0937   HGB 14.3 04/04/2018 1051   HGB 12.0 (L) 01/05/2014 1035   HCT 42.6 04/04/2018 1051   HCT 37.1 (L) 01/05/2014 1035   PLT 338 04/04/2018 1051   MCV 84 04/04/2018 1051   MCV 79.1 (L) 01/05/2014 1035   MCH 28.3 04/04/2018 1051   MCH 26.4 12/18/2017 0937   MCHC 33.6 04/04/2018 1051   MCHC 31.7 12/18/2017 0937   RDW 16.1 (H) 04/04/2018 1051   RDW 16.8 (H) 01/05/2014 1035   LYMPHSABS 0.5 (L) 04/04/2018 1051   LYMPHSABS 0.7 (L) 01/05/2014 1035   MONOABS 0.2 12/18/2017 0937   MONOABS 0.3 01/05/2014 1035   EOSABS 0.1 04/04/2018 1051   BASOSABS 0.0 04/04/2018 1051   BASOSABS 0.1 01/05/2014 1035     Chemistry      Component Value Date/Time   NA 137 04/04/2018 1051    NA 141 01/05/2014 1035   K 3.6 04/04/2018 1051   K 3.9 01/05/2014 1035   CL 96 04/04/2018 1051   CL 106 03/07/2013 0826   CO2 25 04/04/2018 1051   CO2 24 01/05/2014 1035   BUN 5 (L) 04/04/2018 1051   BUN 11.3 01/05/2014 1035   CREATININE 1.04 04/04/2018 1051   CREATININE 1.21 04/30/2015 1335   CREATININE 1.8 (H) 01/05/2014 1035      Component Value Date/Time   CALCIUM 9.2 04/04/2018 1051   CALCIUM 9.5 01/05/2014 1035   ALKPHOS 93 04/04/2018 1051   ALKPHOS 57 01/05/2014 1035   AST 18 04/04/2018 1051   AST 21 01/05/2014 1035   ALT 15 04/04/2018 1051   ALT 31 01/05/2014 1035   BILITOT 0.4 04/04/2018 1051   BILITOT 0.49 01/05/2014 1035       Radiological Studies: No results found.  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 Spleen may be slowly enlarging again but  overall only an approximate 4 cm growth since March 2015 CT scan.  Hematologic profile remains stable.  No current signs for progression to a more aggressive phase. I will continue the JAKOFI at the current dose.  I continue to reinforce the importance of reestablishing care with [facility] transplant program.  2.  Narcotic habituation See discussion above.  I thought that we had a pain contract with him.  I only realized after he left today that we do not.  I will make this a priority at time of his next visit.  Repeat a urine drug screen at that time.  CC: Patient Care Team: Neva Seat, MD as PCP - General (Internal Medicine) [name], MD as Referring Physician (Internal Medicine)   Murriel Hopper, MD, [place]  Hematology-Oncology/Internal Medicine     6/25/201911:46 AM

## 2018-04-16 NOTE — Patient Instructions (Signed)
Continue every 2 month lab checks MD visit 4 months Schedule ultrasound of abdomen for 1 week before visit

## 2018-05-09 ENCOUNTER — Ambulatory Visit (HOSPITAL_COMMUNITY)
Admission: RE | Admit: 2018-05-09 | Discharge: 2018-05-09 | Disposition: A | Discharge status: Home / Self Care | Payer: BLUE CROSS/BLUE SHIELD | Source: Ambulatory Visit | Attending: Oncology | Admitting: Oncology

## 2018-05-09 DIAGNOSIS — C946 Myelodysplastic disease, not classified: Secondary | ICD-10-CM

## 2018-05-09 DIAGNOSIS — D47Z9 Other specified neoplasms of uncertain behavior of lymphoid, hematopoietic and related tissue: Secondary | ICD-10-CM

## 2018-05-15 ENCOUNTER — Other Ambulatory Visit: Payer: Self-pay | Admitting: Internal Medicine

## 2018-05-15 DIAGNOSIS — I10 Essential (primary) hypertension: Secondary | ICD-10-CM

## 2018-05-16 NOTE — Telephone Encounter (Signed)
Refill for HCTZ approved

## 2018-05-23 ENCOUNTER — Encounter: Payer: BLUE CROSS/BLUE SHIELD | Admitting: Internal Medicine

## 2018-05-30 NOTE — Telephone Encounter (Signed)
Opened in error

## 2018-06-27 ENCOUNTER — Encounter: Payer: BLUE CROSS/BLUE SHIELD | Admitting: Internal Medicine

## 2018-06-27 ENCOUNTER — Other Ambulatory Visit: Payer: Self-pay | Admitting: Internal Medicine

## 2018-06-27 DIAGNOSIS — Z79891 Long term (current) use of opiate analgesic: Secondary | ICD-10-CM

## 2018-06-27 NOTE — Telephone Encounter (Signed)
Needs refill on oxyCODONE-acetaminophen (PERCOCET) 10-325 MG tablet  @ [facility] [address]; pt contact [phone]

## 2018-06-29 MED ORDER — OXYCODONE-ACETAMINOPHEN 10-325 MG PO TABS: 1.0000 | ORAL_TABLET | Freq: Four times a day (QID) | ORAL | 0 refills | Status: DC | PRN

## 2018-06-29 NOTE — Telephone Encounter (Signed)
Will order one-time refill, patient is to have new contract with Dr Darnell Level at next visit.

## 2018-07-01 ENCOUNTER — Telehealth: Payer: Self-pay

## 2018-07-01 DIAGNOSIS — D47Z9 Other specified neoplasms of uncertain behavior of lymphoid, hematopoietic and related tissue: Secondary | ICD-10-CM

## 2018-07-01 DIAGNOSIS — C946 Myelodysplastic disease, not classified: Secondary | ICD-10-CM

## 2018-07-01 NOTE — Telephone Encounter (Signed)
ruxolitinib phosphate (JAKAFI) 20 MG tablet, refill request. Pt use mail order, but do not remember the name of the pharmacy.

## 2018-07-01 NOTE — Telephone Encounter (Signed)
Jakafi #[id number] with 11 refills sent to [facility] on 09/11/2017. Patient given number to pharmacy to contact for refills as he said they stopped shipping med 4 months ago. Will call back if any difficulty obtaining. Hubbard Hartshorn, RN, BSN

## 2018-07-02 NOTE — Telephone Encounter (Signed)
Noted thanks °

## 2018-07-16 ENCOUNTER — Other Ambulatory Visit: Payer: Self-pay | Admitting: Oncology

## 2018-07-16 DIAGNOSIS — D47Z9 Other specified neoplasms of uncertain behavior of lymphoid, hematopoietic and related tissue: Secondary | ICD-10-CM

## 2018-07-16 DIAGNOSIS — C946 Myelodysplastic disease, not classified: Secondary | ICD-10-CM

## 2018-07-16 DIAGNOSIS — R161 Splenomegaly, not elsewhere classified: Secondary | ICD-10-CM

## 2018-07-25 ENCOUNTER — Encounter: Payer: BLUE CROSS/BLUE SHIELD | Admitting: Internal Medicine

## 2018-07-29 ENCOUNTER — Other Ambulatory Visit: Payer: Self-pay | Admitting: Internal Medicine

## 2018-07-29 DIAGNOSIS — Z79891 Long term (current) use of opiate analgesic: Secondary | ICD-10-CM

## 2018-07-29 MED ORDER — OXYCODONE-ACETAMINOPHEN 10-325 MG PO TABS: 1.0000 | ORAL_TABLET | Freq: Four times a day (QID) | ORAL | 0 refills | Status: DC | PRN

## 2018-07-29 NOTE — Telephone Encounter (Signed)
Refill Request  oxyCODONE-acetaminophen (PERCOCET) 10-325 MG tablet  [id number] - [place], [place] - [address] 

## 2018-08-02 ENCOUNTER — Emergency Department (HOSPITAL_COMMUNITY): Payer: BLUE CROSS/BLUE SHIELD

## 2018-08-02 ENCOUNTER — Emergency Department (HOSPITAL_COMMUNITY)
Admission: EM | Admit: 2018-08-02 | Discharge: 2018-08-03 | Disposition: A | Discharge status: Home / Self Care | Payer: BLUE CROSS/BLUE SHIELD | Attending: Emergency Medicine | Admitting: Emergency Medicine

## 2018-08-02 ENCOUNTER — Encounter (HOSPITAL_COMMUNITY): Payer: Self-pay

## 2018-08-02 ENCOUNTER — Other Ambulatory Visit: Payer: Self-pay

## 2018-08-02 DIAGNOSIS — Z79899 Other long term (current) drug therapy: Secondary | ICD-10-CM | POA: Diagnosis not present

## 2018-08-02 DIAGNOSIS — Y939 Activity, unspecified: Secondary | ICD-10-CM | POA: Diagnosis not present

## 2018-08-02 DIAGNOSIS — R109 Unspecified abdominal pain: Secondary | ICD-10-CM | POA: Diagnosis not present

## 2018-08-02 DIAGNOSIS — I10 Essential (primary) hypertension: Secondary | ICD-10-CM | POA: Diagnosis not present

## 2018-08-02 DIAGNOSIS — Y999 Unspecified external cause status: Secondary | ICD-10-CM | POA: Diagnosis not present

## 2018-08-02 DIAGNOSIS — S71012A Laceration without foreign body, left hip, initial encounter: Secondary | ICD-10-CM | POA: Diagnosis not present

## 2018-08-02 DIAGNOSIS — Y929 Unspecified place or not applicable: Secondary | ICD-10-CM | POA: Insufficient documentation

## 2018-08-02 DIAGNOSIS — Z23 Encounter for immunization: Secondary | ICD-10-CM | POA: Diagnosis not present

## 2018-08-02 DIAGNOSIS — F17210 Nicotine dependence, cigarettes, uncomplicated: Secondary | ICD-10-CM | POA: Diagnosis not present

## 2018-08-02 DIAGNOSIS — T148XXA Other injury of unspecified body region, initial encounter: Secondary | ICD-10-CM

## 2018-08-02 DIAGNOSIS — J45909 Unspecified asthma, uncomplicated: Secondary | ICD-10-CM | POA: Diagnosis not present

## 2018-08-02 HISTORY — DX: Essential (primary) hypertension: I10

## 2018-08-02 LAB — I-STAT CHEM 8, ED
BUN: 5 mg/dL — AB (ref 6–20)
CALCIUM ION: 1.1 mmol/L — AB (ref 1.15–1.40)
CHLORIDE: 101 mmol/L (ref 98–111)
CREATININE: 1.4 mg/dL — AB (ref 0.61–1.24)
GLUCOSE: 102 mg/dL — AB (ref 70–99)
HCT: 45 % (ref 39.0–52.0)
Hemoglobin: 15.3 g/dL (ref 13.0–17.0)
Potassium: 3.1 mmol/L — ABNORMAL LOW (ref 3.5–5.1)
SODIUM: 139 mmol/L (ref 135–145)
TCO2: 26 mmol/L (ref 22–32)

## 2018-08-02 LAB — CBC
HCT: 45.1 % (ref 39.0–52.0)
Hemoglobin: 14.4 g/dL (ref 13.0–17.0)
MCH: 27.3 pg (ref 26.0–34.0)
MCHC: 31.9 g/dL (ref 30.0–36.0)
MCV: 85.6 fL (ref 80.0–100.0)
PLATELETS: 241 10*3/uL (ref 150–400)
RBC: 5.27 MIL/uL (ref 4.22–5.81)
RDW: 14.8 % (ref 11.5–15.5)
WBC: 11.4 10*3/uL — AB (ref 4.0–10.5)
nRBC: 0.2 % (ref 0.0–0.2)

## 2018-08-02 LAB — COMPREHENSIVE METABOLIC PANEL
ALBUMIN: 3.7 g/dL (ref 3.5–5.0)
ALK PHOS: 80 U/L (ref 38–126)
ALT: 24 U/L (ref 0–44)
ANION GAP: 8 (ref 5–15)
AST: 23 U/L (ref 15–41)
BILIRUBIN TOTAL: 0.5 mg/dL (ref 0.3–1.2)
BUN: 5 mg/dL — ABNORMAL LOW (ref 6–20)
CO2: 26 mmol/L (ref 22–32)
Calcium: 8.7 mg/dL — ABNORMAL LOW (ref 8.9–10.3)
Chloride: 103 mmol/L (ref 98–111)
Creatinine, Ser: 1.26 mg/dL — ABNORMAL HIGH (ref 0.61–1.24)
GFR calc Af Amer: >60 mL/min (ref >60)
GLUCOSE: 101 mg/dL — AB (ref 70–99)
Potassium: 3.1 mmol/L — ABNORMAL LOW (ref 3.5–5.1)
Sodium: 137 mmol/L (ref 135–145)
TOTAL PROTEIN: 7.1 g/dL (ref 6.5–8.1)

## 2018-08-02 LAB — CDS SEROLOGY

## 2018-08-02 LAB — SAMPLE TO BLOOD BANK

## 2018-08-02 LAB — PROTIME-INR
INR: 1.06
Prothrombin Time: 13.7 seconds (ref 11.4–15.2)

## 2018-08-02 LAB — I-STAT CG4 LACTIC ACID, ED: Lactic Acid, Venous: 1.84 mmol/L (ref 0.5–1.9)

## 2018-08-02 LAB — ETHANOL: ALCOHOL ETHYL (B): 109 mg/dL — AB (ref <10)

## 2018-08-02 MED ORDER — IOHEXOL 300 MG/ML  SOLN
100.0000 mL | Freq: Once | INTRAMUSCULAR | Status: AC | PRN
  Administered 2018-08-02: 100 mL via INTRAVENOUS

## 2018-08-02 MED ORDER — POTASSIUM CHLORIDE CRYS ER 20 MEQ PO TBCR
40.0000 meq | EXTENDED_RELEASE_TABLET | Freq: Once | ORAL | Status: AC
  Administered 2018-08-02: 40 meq via ORAL
  Filled 2018-08-02: qty 2

## 2018-08-02 MED ORDER — ONDANSETRON HCL 4 MG/2ML IJ SOLN
4.0000 mg | Freq: Once | INTRAMUSCULAR | Status: AC
  Administered 2018-08-02: 4 mg via INTRAVENOUS
  Filled 2018-08-02: qty 2

## 2018-08-02 MED ORDER — MORPHINE SULFATE (PF) 4 MG/ML IV SOLN
4.0000 mg | Freq: Once | INTRAVENOUS | Status: AC
  Administered 2018-08-02: 4 mg via INTRAVENOUS
  Filled 2018-08-02: qty 1

## 2018-08-02 MED ORDER — LIDOCAINE HCL (PF) 1 % IJ SOLN
20.0000 mL | Freq: Once | INTRAMUSCULAR | Status: AC
  Administered 2018-08-02: 20 mL via INTRADERMAL
  Filled 2018-08-02: qty 20

## 2018-08-02 MED ORDER — LACTATED RINGERS IV BOLUS
2000.0000 mL | Freq: Once | INTRAVENOUS | Status: AC
  Administered 2018-08-02: 2000 mL via INTRAVENOUS
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
## 2018-08-03 MED ORDER — TETANUS-DIPHTH-ACELL PERTUSSIS 5-2.5-18.5 LF-MCG/0.5 IM SUSP
0.5000 mL | Freq: Once | INTRAMUSCULAR | Status: AC
  Administered 2018-08-03: 0.5 mL via INTRAMUSCULAR
  Filled 2018-08-03: qty 0.5

## 2018-08-03 NOTE — ED Notes (Signed)
Patient verbalizes understanding of discharge instructions. Opportunity for questioning and answers were provided. Armband removed by staff, pt discharged from ED ambulatory w/ wife 

## 2018-08-12 ENCOUNTER — Other Ambulatory Visit: Payer: Self-pay

## 2018-08-12 ENCOUNTER — Encounter: Payer: Self-pay | Admitting: Internal Medicine

## 2018-08-12 ENCOUNTER — Ambulatory Visit (INDEPENDENT_AMBULATORY_CARE_PROVIDER_SITE_OTHER): Payer: BLUE CROSS/BLUE SHIELD | Admitting: Internal Medicine

## 2018-08-12 VITALS — BP 144/73 | HR 77 | Temp 97.8°F | Ht 73.0 in | Wt 200.2 lb

## 2018-08-12 DIAGNOSIS — Z79891 Long term (current) use of opiate analgesic: Secondary | ICD-10-CM | POA: Diagnosis not present

## 2018-08-12 DIAGNOSIS — R161 Splenomegaly, not elsewhere classified: Secondary | ICD-10-CM | POA: Diagnosis not present

## 2018-08-12 DIAGNOSIS — S71012A Laceration without foreign body, left hip, initial encounter: Secondary | ICD-10-CM | POA: Insufficient documentation

## 2018-08-12 DIAGNOSIS — S31114D Laceration without foreign body of abdominal wall, left lower quadrant without penetration into peritoneal cavity, subsequent encounter: Secondary | ICD-10-CM

## 2018-08-12 DIAGNOSIS — S71012D Laceration without foreign body, left hip, subsequent encounter: Secondary | ICD-10-CM

## 2018-08-12 HISTORY — DX: Laceration without foreign body, left hip, initial encounter: S71.012A

## 2018-08-12 MED ORDER — OXYCODONE-ACETAMINOPHEN 10-325 MG PO TABS: 1.0000 | ORAL_TABLET | Freq: Four times a day (QID) | ORAL | 0 refills | Status: DC | PRN

## 2018-08-12 NOTE — Assessment & Plan Note (Signed)
Wound is healing well without signs of infection. 4 staples removed beginning with anterior portion of laceration. Wound was noted to immediately dehisce. General surgery was consulted to discuss further management. It was recommended to leave remaining staples in and use wet-to-dry dressing to allow wound healing.  Steri strips were applied across anterior portion of laceration. Wound was dressed and patient provided supplies to change once daily at home. Will bring back in 3 days for repeat wound check.  Also prescribed 5 days' worth of Percocet to treat his acute hip pain.
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
## 2018-08-12 NOTE — Patient Instructions (Signed)
Mr. Whitefield, It was a pleasure meeting you today. I am sorry you have had to go through this. We are sending you home with dressing supplies to change it once a day. I am also giving you an additional 5 day's worth of pain medication since you have had to take more with your acute injury. We will plan to see you back Thursday for another wound check, and I will have your paperwork ready for you as well.   Please call beforehand if you have any questions or concerns.  See you soon! Dr. Koleen Distance
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
## 2018-08-15 ENCOUNTER — Encounter: Payer: BLUE CROSS/BLUE SHIELD | Admitting: Internal Medicine

## 2018-08-19 ENCOUNTER — Telehealth: Payer: Self-pay | Admitting: Internal Medicine

## 2018-08-19 ENCOUNTER — Other Ambulatory Visit: Payer: Self-pay

## 2018-08-19 ENCOUNTER — Ambulatory Visit (INDEPENDENT_AMBULATORY_CARE_PROVIDER_SITE_OTHER): Payer: BLUE CROSS/BLUE SHIELD | Admitting: Internal Medicine

## 2018-08-19 VITALS — BP 124/79 | HR 95 | Temp 98.6°F | Ht 73.0 in | Wt 199.5 lb

## 2018-08-19 DIAGNOSIS — I10 Essential (primary) hypertension: Secondary | ICD-10-CM

## 2018-08-19 DIAGNOSIS — W458XXD Other foreign body or object entering through skin, subsequent encounter: Secondary | ICD-10-CM | POA: Diagnosis not present

## 2018-08-19 DIAGNOSIS — S71012D Laceration without foreign body, left hip, subsequent encounter: Secondary | ICD-10-CM

## 2018-08-19 DIAGNOSIS — N179 Acute kidney failure, unspecified: Secondary | ICD-10-CM

## 2018-08-19 NOTE — Telephone Encounter (Signed)
Returned call to patient, no answer.  Left message to call RN back.  SChaplin,RN   

## 2018-08-19 NOTE — Telephone Encounter (Signed)
Pt medication isn't working; pt contact [phone]
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
## 2018-08-19 NOTE — Patient Instructions (Signed)
Thank you for allowing [place] to provide your care today. Today we discussed your laceration along right left side.   You may shower, but please try to keep the area clean and dry. Leave the steri-strips in place for a least the next 48 hours.   Please take Tylenol Extra Strength 500 mg 1-2 tablets every eight hours as needed for pain. Do not take more than 4,000 mg in a 24 hour period.   Please follow-up in three days.    Should you have any questions or concerns please call the internal medicine clinic at [phone].

## 2018-08-19 NOTE — Telephone Encounter (Signed)
Received return call from pt.  States he took tylenol 500mg  at 1200, 1240, and 1300  C/O no relief and pain is still at a level 8 now.  He is requesting different pain medication.

## 2018-08-20 DIAGNOSIS — N179 Acute kidney failure, unspecified: Secondary | ICD-10-CM | POA: Insufficient documentation

## 2018-08-20 HISTORY — DX: Acute kidney failure, unspecified: N17.9

## 2018-08-20 NOTE — Progress Notes (Signed)
Internal Medicine Clinic Attending  I saw and evaluated the patient.  I personally confirmed the key portions of the history and exam documented by Dr. Seawell and I reviewed pertinent patient test results.  The assessment, diagnosis, and plan were formulated together and I agree with the documentation in the resident's note.     

## 2018-08-20 NOTE — Assessment & Plan Note (Signed)
Cr in while in the hospital 1.4 He is agreeable to f/u BMP at f/u on 10/30.   - BMP at f/u

## 2018-08-20 NOTE — Telephone Encounter (Signed)
We will have to hold off on opioids at this time, and he cannot take NSAIDs as last Cr showed AKI. We can repeat his BMP when he returns Thursday to see if he might be able to take Ibuprofen. I would recommend continuing the Tylenol, resting and icing the area if needed as long as he can keep it dry.
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
## 2018-08-20 NOTE — Telephone Encounter (Signed)
Pt called back to speak with a nurse.

## 2018-08-20 NOTE — Assessment & Plan Note (Addendum)
BP Readings from Last 3 Encounters:  08/19/18 124/79  08/12/18 (!) 144/73  08/03/18 (!) 141/94    He states he is currently not taking HCTZ at this time and BP appears controlled without medication currently.   - monitor

## 2018-08-20 NOTE — Telephone Encounter (Signed)
Returned call to patient, no answer.  Left message to call RN back.  SChaplin,RN   

## 2018-08-21 NOTE — Telephone Encounter (Signed)
Spoke with pt via phone, informed him of Dr. Wylie Hail instructions regarding to f/u in [facility] tomorrow, reason for not wanting to prescribe opiods or NSAIDs due to AKI and to continue with current POC.  Pt verbalized understanding.  Appointment scheduled for tomorrow, 08/22/18 @ 1515.

## 2018-08-21 NOTE — Telephone Encounter (Signed)
Returned call to patient, no answer.  Left message to call RN back regarding pain medication questions and to schedule f/u appt for tomorrow per Dr. Aurelio Jew instructions.  [place]

## 2018-08-22 ENCOUNTER — Ambulatory Visit: Payer: BLUE CROSS/BLUE SHIELD

## 2018-08-27 ENCOUNTER — Ambulatory Visit: Payer: BLUE CROSS/BLUE SHIELD

## 2018-08-28 ENCOUNTER — Other Ambulatory Visit: Payer: Self-pay | Admitting: Oncology

## 2018-08-28 DIAGNOSIS — Z79891 Long term (current) use of opiate analgesic: Secondary | ICD-10-CM

## 2018-08-28 DIAGNOSIS — R161 Splenomegaly, not elsewhere classified: Secondary | ICD-10-CM

## 2018-08-28 DIAGNOSIS — C946 Myelodysplastic disease, not classified: Secondary | ICD-10-CM

## 2018-08-28 DIAGNOSIS — D47Z9 Other specified neoplasms of uncertain behavior of lymphoid, hematopoietic and related tissue: Secondary | ICD-10-CM

## 2018-08-28 NOTE — Progress Notes (Signed)
ref

## 2018-08-29 ENCOUNTER — Other Ambulatory Visit: Payer: Self-pay

## 2018-08-29 ENCOUNTER — Encounter: Payer: BLUE CROSS/BLUE SHIELD | Admitting: Internal Medicine

## 2018-08-29 ENCOUNTER — Ambulatory Visit (INDEPENDENT_AMBULATORY_CARE_PROVIDER_SITE_OTHER): Payer: BLUE CROSS/BLUE SHIELD | Admitting: Internal Medicine

## 2018-08-29 VITALS — BP 136/79 | HR 87 | Temp 98.4°F | Ht 73.0 in | Wt 201.3 lb

## 2018-08-29 DIAGNOSIS — N179 Acute kidney failure, unspecified: Secondary | ICD-10-CM

## 2018-08-29 DIAGNOSIS — X58XXXD Exposure to other specified factors, subsequent encounter: Secondary | ICD-10-CM | POA: Diagnosis not present

## 2018-08-29 DIAGNOSIS — I10 Essential (primary) hypertension: Secondary | ICD-10-CM

## 2018-08-29 DIAGNOSIS — R05 Cough: Secondary | ICD-10-CM | POA: Diagnosis not present

## 2018-08-29 DIAGNOSIS — D7581 Myelofibrosis: Secondary | ICD-10-CM

## 2018-08-29 DIAGNOSIS — R059 Cough, unspecified: Secondary | ICD-10-CM

## 2018-08-29 DIAGNOSIS — S71012D Laceration without foreign body, left hip, subsequent encounter: Secondary | ICD-10-CM | POA: Diagnosis not present

## 2018-08-29 DIAGNOSIS — Z79891 Long term (current) use of opiate analgesic: Secondary | ICD-10-CM

## 2018-08-29 HISTORY — DX: Cough, unspecified: R05.9

## 2018-08-29 MED ORDER — BENZONATATE 100 MG PO CAPS: 100.0000 mg | ORAL_CAPSULE | Freq: Three times a day (TID) | ORAL | 0 refills | Status: AC | PRN

## 2018-08-29 MED ORDER — OXYCODONE-ACETAMINOPHEN 10-325 MG PO TABS: 1.0000 | ORAL_TABLET | Freq: Four times a day (QID) | ORAL | 0 refills | Status: DC | PRN
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
## 2018-08-29 NOTE — Assessment & Plan Note (Addendum)
The laceration appears to be healing well. He reports mild pain in the area, but mostly pruritus. He takes Percocet for splenic enlargement, so his pain has been well-controlled. He denies any fevers, chills, bleeding, or drainage.   Plan 1. Remaining staples were removed today and the wound was dressed 2. Advised gentle wound care with soap, water, and patting dry 3. Advised against lifting heavy objects for another week. 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

## 2018-08-29 NOTE — Assessment & Plan Note (Signed)
Patient reports a 3-day history of cough and rhinorrhea without fever, chills, sore throat, or dyspnea. The cough is productive of a white sputum. He states that his 46 year old son is sick with similar symptoms. His symptoms are most likely due to viral URI. Will treat supportively.  Plan 1. Tessalon perles 100mg  TID PRN for 10 days as needed for cough

## 2018-08-29 NOTE — Patient Instructions (Addendum)
It was a pleasure taking care of you today, Mr. Metts!  We removed the last few staples on your cut. The area appears to be healing very well. Keep gently washing the area with soap and water when you're in the shower and pat dry. Continue to avoid heavy lifting for another week while the area continues to heal. Please come back to see [place] if you have fevers, chills, the cut opens back up, or the cut appears to be draining.   Please come for a lab visit on Monday 11/11 or whenever is convenient for you next week to get some blood work to reassess your kidney function.   For your cough, I have prescribed tessalon perles. You can take 100mg  three times a day as needed for cough.  Call our clinic at [phone] if you have any questions.  Thanks, Dr. Annie Paras

## 2018-08-29 NOTE — Progress Notes (Signed)
   CC: laceration f/u  HPI:   [name] is a 47 y.o. male with a history of HTN and myelofibrosis as well as the other medical conditions listed below who presents to the internal medicine clinic today for laceration f/u and acute cough. Please see problem based charting for the status of the patient's current and chronic medical conditions.   Past Medical History:  Diagnosis Date  . Asthma   . Asthma, cold induced   . Erectile dysfunction 06/05/2016  . Herpes   . 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 Hypertension   . Myelofibrosis ([place])    followed by Dr. Sherryl Walter  . No pertinent past medical history    enlarged spleen  . Spleen enlargement    myofibrosis need bonemarrow transplant  . 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 Remainder of all ROS negative.   Physical Exam: Vitals:   08/29/18 0924  BP: 136/79  Pulse: 87  Temp: 98.4 F (36.9 C)  TempSrc: Oral  SpO2: 99%  Weight: 201 lb 4.8 oz (91.3 kg)  Height: 6\' 1"  (1.854 m)   Physical Exam  Constitutional: Well-developed, well-nourished, and in no distress.  Eyes: Pupils are equal, round, and reactive to light. EOM are normal.  Neuro: Alert and oriented. Follows commands and answers questions appropriately. Normal gait. Skin: Warm and dry. 12 cm well-healing laceration on the left hip without erythema, warmth, or drainage. 4 staples in place.   Assessment & Plan:   See Encounters Tab for problem based charting.  Patient seen with Dr. Daryll Drown

## 2018-08-29 NOTE — Assessment & Plan Note (Addendum)
Patient has been taking his Percocet and continues to get relief from it. Urine sample from 4/25 when he stated that he had taken his medication showed the expected compounds, however it also showed evidence of cocaine use. His last refill was 60 tablets on 10/7. He was provided with an additional 5-day supply on 10/21 for acute hip pain in the setting of the laceration.   Plan - Percocet 10-325 q6hrs PRN #60 - UDS when he returns for lab appointment next week - F/u with PCP for continued prescribing

## 2018-08-29 NOTE — Assessment & Plan Note (Signed)
Cr elevated to 1.4 (baseline around 1) 10/11. He cannot stay for a lab draw today, but would like to make a lab appointment early next week for a repeat BMP.  Plan - Repeat BMP 11/11 at lab appointment

## 2018-08-29 NOTE — Addendum Note (Signed)
Addended by: Corinne Ports on: 08/29/2018 01:36 PM   Modules accepted: Orders

## 2018-08-30 NOTE — Progress Notes (Signed)
Internal Medicine Clinic Attending  I saw and evaluated the patient.  I personally confirmed the key portions of the history and exam documented by Dr. Dorrell and I reviewed pertinent patient test results.  The assessment, diagnosis, and plan were formulated together and I agree with the documentation in the resident's note. 

## 2018-09-02 ENCOUNTER — Other Ambulatory Visit: Payer: BLUE CROSS/BLUE SHIELD

## 2018-09-06 ENCOUNTER — Other Ambulatory Visit: Payer: Self-pay | Admitting: Oncology

## 2018-09-06 DIAGNOSIS — D47Z9 Other specified neoplasms of uncertain behavior of lymphoid, hematopoietic and related tissue: Secondary | ICD-10-CM

## 2018-09-06 DIAGNOSIS — C946 Myelodysplastic disease, not classified: Secondary | ICD-10-CM

## 2018-09-09 ENCOUNTER — Ambulatory Visit (INDEPENDENT_AMBULATORY_CARE_PROVIDER_SITE_OTHER): Payer: BLUE CROSS/BLUE SHIELD | Admitting: Internal Medicine

## 2018-09-09 ENCOUNTER — Encounter (INDEPENDENT_AMBULATORY_CARE_PROVIDER_SITE_OTHER): Payer: Self-pay

## 2018-09-09 ENCOUNTER — Other Ambulatory Visit: Payer: Self-pay

## 2018-09-09 VITALS — BP 150/87 | HR 84 | Temp 98.3°F | Ht 73.0 in | Wt 200.9 lb

## 2018-09-09 DIAGNOSIS — X58XXXD Exposure to other specified factors, subsequent encounter: Secondary | ICD-10-CM | POA: Diagnosis not present

## 2018-09-09 DIAGNOSIS — N179 Acute kidney failure, unspecified: Secondary | ICD-10-CM | POA: Diagnosis not present

## 2018-09-09 DIAGNOSIS — I10 Essential (primary) hypertension: Secondary | ICD-10-CM

## 2018-09-09 DIAGNOSIS — S71012D Laceration without foreign body, left hip, subsequent encounter: Secondary | ICD-10-CM | POA: Diagnosis not present

## 2018-09-09 DIAGNOSIS — Z79891 Long term (current) use of opiate analgesic: Secondary | ICD-10-CM

## 2018-09-09 MED ORDER — LIDOCAINE 5 % EX OINT: 1.0000 "application " | TOPICAL_OINTMENT | Freq: Three times a day (TID) | CUTANEOUS | 0 refills | Status: DC | PRN

## 2018-09-09 NOTE — Assessment & Plan Note (Signed)
Patient reports he hasn't had time for his repeat BMP yet and cannot stay today for the lab test. He will make a lab appointment in the future when he is available. He will get BMP and UDS at this time.

## 2018-09-09 NOTE — Assessment & Plan Note (Signed)
The patient reports that he has a burning pain at the site of his left hip laceration. The pain worsens when he is at work as his job involves lifting and transporting heavy packages. He denies bleeding or drainage from the site. He takes Percocet at baseline, but this doesn't help the burning pain in his hip.  Assessment: On exam, the wound appears to be healing well without erythema, warmth, drainage, or fluctuance. The burning pain is likely caused by the healing of subcutaneous and cutaneous nerves.   Plan 1. Lidocaine gel applied to the scar TID PRN 2. Work note provided for 1 more week off work

## 2018-09-09 NOTE — Progress Notes (Signed)
   CC: pain at site of left hip laceration  HPI:   [name] is a 47 y.o. male with a history of HTN and myelofibrosis as well as the other medical conditions listed below who presents to the internal medicine clinic today for continued pain at the site of his left hip laceration. Please see problem based charting for the history and status of the patient's current and chronic medical conditions.   Past Medical History:  Diagnosis Date  . Asthma   . Asthma, cold induced   . Erectile dysfunction 06/05/2016  . Herpes   . 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 Hypertension   . Myelofibrosis ([place])    followed by Dr. Sherryl Manges  . No pertinent past medical history    enlarged spleen  . Spleen enlargement    myofibrosis need bonemarrow transplant  . 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 Remainder of all ROS negative.  Physical Exam: Vitals:   09/09/18 1455  BP: (!) 150/87  Pulse: 84  Temp: 98.3 F (36.8 C)  TempSrc: Oral  SpO2: 100%  Weight: 200 lb 14.4 oz (91.1 kg)  Height: 6\' 1"  (1.854 m)   Physical Exam  Constitutional: Well-developed, well-nourished, and in no distress. Eyes:EOMare normal.  Neuro: Alert and oriented. Follows commands and answers questions appropriately. Normal gait. Skin: Warm and dry. 12 cm well-healing laceration on the left hip without erythema, warmth, or drainage. No palpable masses or fluctuance.   Assessment & Plan:   See Encounters Tab for problem based charting.  Patient seen with Dr. Daryll Drown

## 2018-09-09 NOTE — Patient Instructions (Signed)
1. Work note provided.  2. Prescribed lidocaine gel. Apply three times a day to the painful area.  3. Come back tomorrow for a lab appointment.  Thanks, Dr. Annie Paras

## 2018-09-11 NOTE — Progress Notes (Signed)
Internal Medicine Clinic Attending  I saw and evaluated the patient.  I personally confirmed the key portions of the history and exam documented by Dr. Dorrell and I reviewed pertinent patient test results.  The assessment, diagnosis, and plan were formulated together and I agree with the documentation in the resident's note. 

## 2018-09-27 ENCOUNTER — Other Ambulatory Visit: Payer: BLUE CROSS/BLUE SHIELD

## 2018-09-27 ENCOUNTER — Other Ambulatory Visit: Payer: Self-pay | Admitting: Internal Medicine

## 2018-09-27 ENCOUNTER — Other Ambulatory Visit (INDEPENDENT_AMBULATORY_CARE_PROVIDER_SITE_OTHER): Payer: BLUE CROSS/BLUE SHIELD

## 2018-09-27 ENCOUNTER — Other Ambulatory Visit: Payer: Self-pay | Admitting: *Deleted

## 2018-09-27 DIAGNOSIS — C946 Myelodysplastic disease, not classified: Secondary | ICD-10-CM

## 2018-09-27 DIAGNOSIS — D47Z9 Other specified neoplasms of uncertain behavior of lymphoid, hematopoietic and related tissue: Secondary | ICD-10-CM | POA: Diagnosis not present

## 2018-09-27 DIAGNOSIS — Z79891 Long term (current) use of opiate analgesic: Secondary | ICD-10-CM

## 2018-09-27 DIAGNOSIS — S71012D Laceration without foreign body, left hip, subsequent encounter: Secondary | ICD-10-CM

## 2018-09-27 MED ORDER — OXYCODONE-ACETAMINOPHEN 10-325 MG PO TABS: 1.0000 | ORAL_TABLET | Freq: Two times a day (BID) | ORAL | 0 refills | Status: DC | PRN

## 2018-09-27 NOTE — Progress Notes (Signed)
Contract with Dr Darnell Level who is away. Last UDS had oxycodone but also cocaine. He did not provide urine at that appt or next appt - both red flags. 30 day supply give but need to use caution and repeat UDS without advance notice.

## 2018-09-28 LAB — COMPREHENSIVE METABOLIC PANEL
A/G RATIO: 1.7 (ref 1.2–2.2)
ALT: 21 IU/L (ref 0–44)
AST: 20 IU/L (ref 0–40)
Albumin: 4.5 g/dL (ref 3.5–5.5)
Alkaline Phosphatase: 90 IU/L (ref 39–117)
BUN/Creatinine Ratio: 8 — ABNORMAL LOW (ref 9–20)
BUN: 9 mg/dL (ref 6–24)
Bilirubin Total: 0.3 mg/dL (ref 0.0–1.2)
CHLORIDE: 100 mmol/L (ref 96–106)
CO2: 21 mmol/L (ref 20–29)
Calcium: 8.7 mg/dL (ref 8.7–10.2)
Creatinine, Ser: 1.13 mg/dL (ref 0.76–1.27)
GFR calc Af Amer: 90 mL/min/{1.73_m2} (ref >59)
GFR, EST NON AFRICAN AMERICAN: 78 mL/min/{1.73_m2} (ref >59)
Globulin, Total: 2.7 g/dL (ref 1.5–4.5)
Glucose: 83 mg/dL (ref 65–99)
POTASSIUM: 4.1 mmol/L (ref 3.5–5.2)
Sodium: 140 mmol/L (ref 134–144)
Total Protein: 7.2 g/dL (ref 6.0–8.5)

## 2018-09-28 LAB — CBC WITH DIFFERENTIAL/PLATELET
BASOS ABS: 0.1 10*3/uL (ref 0.0–0.2)
BASOS: 1 %
EOS (ABSOLUTE): 0.2 10*3/uL (ref 0.0–0.4)
Eos: 2 %
Hematocrit: 39.7 % (ref 37.5–51.0)
Hemoglobin: 13.5 g/dL (ref 13.0–17.7)
IMMATURE GRANS (ABS): 0.2 10*3/uL — AB (ref 0.0–0.1)
IMMATURE GRANULOCYTES: 2 %
LYMPHS: 9 %
Lymphocytes Absolute: 1 10*3/uL (ref 0.7–3.1)
MCH: 27.6 pg (ref 26.6–33.0)
MCHC: 34 g/dL (ref 31.5–35.7)
MCV: 81 fL (ref 79–97)
Monocytes Absolute: 0.3 10*3/uL (ref 0.1–0.9)
Monocytes: 3 %
NEUTROS PCT: 83 %
NRBC: 1 % — ABNORMAL HIGH (ref 0–0)
Neutrophils Absolute: 9.2 10*3/uL — ABNORMAL HIGH (ref 1.4–7.0)
Platelets: 385 10*3/uL (ref 150–450)
RBC: 4.9 x10E6/uL (ref 4.14–5.80)
RDW: 14.9 % (ref 12.3–15.4)
WBC: 11 10*3/uL — ABNORMAL HIGH (ref 3.4–10.8)

## 2018-09-28 LAB — LACTATE DEHYDROGENASE: LDH: 560 IU/L — AB (ref 121–224)

## 2018-09-29 ENCOUNTER — Other Ambulatory Visit: Payer: Self-pay | Admitting: Oncology

## 2018-09-29 MED ORDER — POTASSIUM CHLORIDE ER 20 MEQ PO TBCR: 10.0000 meq | EXTENDED_RELEASE_TABLET | Freq: Two times a day (BID) | ORAL | 1 refills | Status: DC

## 2018-09-30 ENCOUNTER — Telehealth: Payer: Self-pay | Admitting: *Deleted

## 2018-09-30 NOTE — Telephone Encounter (Signed)
-----   Message from Sean Belt, MD sent at 09/29/2018  6:50 AM EST ----- Call pt: potassium level low. I sent Rx for Kcl to his pharmacy 20 meq twice daily for one week then repeat lab next week

## 2018-09-30 NOTE — Telephone Encounter (Signed)
Called pt about test results - no answer; left message to call the office.

## 2018-10-01 ENCOUNTER — Other Ambulatory Visit: Payer: Self-pay | Admitting: Internal Medicine

## 2018-10-01 ENCOUNTER — Telehealth: Payer: Self-pay | Admitting: *Deleted

## 2018-10-01 DIAGNOSIS — N179 Acute kidney failure, unspecified: Secondary | ICD-10-CM

## 2018-10-01 DIAGNOSIS — R161 Splenomegaly, not elsewhere classified: Secondary | ICD-10-CM

## 2018-10-01 NOTE — Telephone Encounter (Signed)
Will defer to PCP, Dr. Eileen Stanford, but probably ok to stop the potassium supplement. Hypokalemia may have been related to his diuretic, so he would be at ongoing risk and will need recheck.  It also appears there is an active [place] order, but we can put in a new one if need be.

## 2018-10-01 NOTE — Telephone Encounter (Signed)
Call from pt - informed K+ level is 4.1 ( was 3.1 2 months ago). I will check with Dr Darnell Level to be sure he needs to take K+ supplement. And stated he never had ultrasound done which was ordered in June; he will need a new order. Stated ok to leave message on his cell phone; he cannot have cell phones while working.

## 2018-10-01 NOTE — Telephone Encounter (Signed)
Al Corpus,   I agree with Dr. Rebeca Alert. His hypokalemia was most likely due to his HCTZ use. It is ok to discontinue it for now. I will add a future order to have a BMP check in the 2-3 weeks. If hypokalemia persists, then we can switch to Lisinopril or a long acting thiazide like chlorthalidone.   Thanks

## 2018-10-01 NOTE — Telephone Encounter (Signed)
Sorry - I must have misread his lab - I saw last month's value. Thanks Dr Rebeca Alert for clarifying.

## 2018-10-01 NOTE — Telephone Encounter (Signed)
Dr Eileen Stanford, even though you didn't prescribe the K+ could you please review the chart and send a new script for the K+, the one done on 12/8 is a little confusing, [facility].

## 2018-10-01 NOTE — Telephone Encounter (Signed)
Called pt - no answer; left message to disregard need for K+ but call back to schedule lab appt in 2-3 weeks per Dr Eileen Stanford. And [facility] will be calling to re-schedule [place].

## 2018-10-02 ENCOUNTER — Other Ambulatory Visit: Payer: Self-pay | Admitting: Internal Medicine

## 2018-10-02 NOTE — Telephone Encounter (Signed)
Ok great. Thanks for the clarification.

## 2018-10-02 NOTE — Telephone Encounter (Signed)
Sorry dr Eileen Stanford, I did not see the note from previous just went by [facility] call for clarification. Glenda explained pt does not need K+, the script was accidentally sent to pharmacy by dr Darnell Level

## 2018-10-02 NOTE — Telephone Encounter (Signed)
Hi Helen,   Sorry for the confusion. It looks like Dr. Darnell Level refilled the prescription on 12/8. So he is to take 9mEq for 14 days. I also put in a future order to have a BMP in 2-3 weeks.   Thanks

## 2018-10-02 NOTE — Telephone Encounter (Signed)
Noted thanks °

## 2018-10-07 LAB — TOXASSURE SELECT,+ANTIDEPR,UR

## 2018-10-08 ENCOUNTER — Telehealth: Payer: Self-pay | Admitting: *Deleted

## 2018-10-08 NOTE — Telephone Encounter (Signed)
Call from [facility], they state pt should have run out of JAKAFI at end of November, they have tried to make contact with pt and he does not respond to messages, they are going to call his mother and mail him a letter, they will call back if continues to be unable to make contact with pt r/t his medication.

## 2018-10-08 NOTE — Telephone Encounter (Signed)
Noted Very spotty compliance but like clockwork when he wants refills on his percocet. Please see that he has a scheduled follow up visit w me. Thx DrG

## 2018-10-18 ENCOUNTER — Ambulatory Visit (HOSPITAL_COMMUNITY): Payer: BLUE CROSS/BLUE SHIELD

## 2018-10-25 ENCOUNTER — Ambulatory Visit (HOSPITAL_COMMUNITY)
Admission: RE | Admit: 2018-10-25 | Discharge: 2018-10-25 | Disposition: A | Discharge status: Home / Self Care | Payer: BLUE CROSS/BLUE SHIELD | Source: Ambulatory Visit | Attending: Oncology | Admitting: Oncology

## 2018-10-25 ENCOUNTER — Other Ambulatory Visit: Payer: Self-pay

## 2018-10-25 DIAGNOSIS — S71012D Laceration without foreign body, left hip, subsequent encounter: Secondary | ICD-10-CM

## 2018-10-25 DIAGNOSIS — R161 Splenomegaly, not elsewhere classified: Secondary | ICD-10-CM | POA: Insufficient documentation

## 2018-10-25 MED ORDER — OXYCODONE-ACETAMINOPHEN 10-325 MG PO TABS: 1.0000 | ORAL_TABLET | Freq: Two times a day (BID) | ORAL | 0 refills | Status: DC | PRN

## 2018-10-25 NOTE — Telephone Encounter (Signed)
oxyCODONE-acetaminophen (PERCOCET) 10-325 MG tablet, refill request @  [id number] [place], [place]. [phone] (Phone) [phone] (Fax)  Pt states he would like this med by today, please call pt back.

## 2018-10-25 NOTE — Telephone Encounter (Signed)
Called pt - informed of refill; stated thank-you to Dr Darnell Level.

## 2018-10-29 ENCOUNTER — Telehealth: Payer: Self-pay | Admitting: Internal Medicine

## 2018-10-29 NOTE — Telephone Encounter (Signed)
I called the patient to discuss Abd US findings but was unable to reach him. I also discussed the findings with Dr. Darnell Level who asked for Sean Walter to keep his upcoming appointment to discuss treatment options.

## 2018-11-21 ENCOUNTER — Telehealth: Payer: Self-pay | Admitting: *Deleted

## 2018-11-21 NOTE — Telephone Encounter (Addendum)
Information was completed by Dr. Beryle Beams and faxed to [facility] at [phone]. Awaiting determination.  Sander Nephew, RN 11/21/2018.  Approval for Jakafi 12/09/2018 thru 12/10/2019. [facility] was called at [phone] and notified of the approval.   Sander Nephew, RN 12/10/2018 11:40 AM.

## 2018-11-22 ENCOUNTER — Other Ambulatory Visit: Payer: Self-pay

## 2018-11-22 DIAGNOSIS — S71012D Laceration without foreign body, left hip, subsequent encounter: Secondary | ICD-10-CM

## 2018-11-22 NOTE — Telephone Encounter (Signed)
oxyCODONE-acetaminophen (PERCOCET) 10-325 MG tablet, refill request @  [id number] [place], [place] - [address] [phone] (Phone) [phone] (Fax)    

## 2018-11-24 MED ORDER — OXYCODONE-ACETAMINOPHEN 10-325 MG PO TABS: 1.0000 | ORAL_TABLET | Freq: Two times a day (BID) | ORAL | 0 refills | Status: DC | PRN

## 2018-11-24 NOTE — Telephone Encounter (Signed)
Thanks,   I will refill the medication. Last Toxassure was in December and no red flags.

## 2018-11-25 ENCOUNTER — Other Ambulatory Visit: Payer: Self-pay | Admitting: Internal Medicine

## 2018-11-25 NOTE — Telephone Encounter (Signed)
Informed pt that Oxycodone was refilled yesterday and [facility]. He stated when he called the pharmacy yesterday and this morning, he was told it was no there. He will [facility] - and if he has any problems, he will call back.

## 2018-11-25 NOTE — Telephone Encounter (Signed)
Needs refill on oxyCODONE-acetaminophen (PERCOCET) 10-325 MG tablet [id number] [place], [place] [facility] [address]. ;pt contact [phone]

## 2018-12-09 ENCOUNTER — Encounter: Payer: Self-pay | Admitting: *Deleted

## 2018-12-10 ENCOUNTER — Other Ambulatory Visit: Payer: Self-pay

## 2018-12-10 ENCOUNTER — Ambulatory Visit (INDEPENDENT_AMBULATORY_CARE_PROVIDER_SITE_OTHER): Payer: BLUE CROSS/BLUE SHIELD | Admitting: Oncology

## 2018-12-10 ENCOUNTER — Encounter: Payer: Self-pay | Admitting: Oncology

## 2018-12-10 VITALS — BP 141/80 | HR 86 | Temp 98.5°F | Ht 73.0 in | Wt 207.6 lb

## 2018-12-10 DIAGNOSIS — R161 Splenomegaly, not elsewhere classified: Secondary | ICD-10-CM

## 2018-12-10 DIAGNOSIS — Z886 Allergy status to analgesic agent status: Secondary | ICD-10-CM

## 2018-12-10 DIAGNOSIS — C946 Myelodysplastic disease, not classified: Secondary | ICD-10-CM

## 2018-12-10 DIAGNOSIS — D7581 Myelofibrosis: Secondary | ICD-10-CM

## 2018-12-10 DIAGNOSIS — Z79891 Long term (current) use of opiate analgesic: Secondary | ICD-10-CM

## 2018-12-10 DIAGNOSIS — D47Z9 Other specified neoplasms of uncertain behavior of lymphoid, hematopoietic and related tissue: Secondary | ICD-10-CM

## 2018-12-10 NOTE — Progress Notes (Signed)
Hematology and Oncology Follow Up Visit  Sean Walter [id number] 03/09/1971 48 y.o. 12/10/2018 7:04 PM   Principle Diagnosis: Encounter Diagnoses  Name Primary?  . Long term current use of opiate analgesic Yes  . 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 Spleen enlargement   Clinical summary: 48year-old man who was initially diagnosed as having myelofibrosis but who more likely has a non-BCR-ABL myeloproliferative disorder. He presented with leukocytosis and massive splenomegaly in January 2012. Bone marrow biopsy done in 11/16/10 was hypercellular with prominent proliferation of megakaryocytes many of which had abnormal morphology. Collagen and reticulin stains were focally positive. There were no excess blasts; in fact, on a 500 cell count differential, 0% blasts were recorded. Routine cytogenetics were normal but FISH probes were not done. Peripheral blood was negative for the presence of BCR-ABL transcripts. He isJAK-2 gene mutation positive. (03/07/13) The patient was started on JAKOFI in February, 2012. Current dose is 20 mg twice daily. He has been on doses as high as 25 mg twice daily.  I have followed him since July of 2013. Blood counts initially stable and there was a significant decrease in his spleen size although he continued to complain of pain and requested Percocet on a regular basis.  He developed cumulative myelotoxicity requiring a dose reduction from 25 mg twice daily down to 20 mg twice daily in late August 2015. There was a discrepancy in my notes where I said I was decreasing him to 50 mg twice daily in August 2015. Itcame to my attention that the patient was actually only taking 10 mg twice daily. It is no surprise that his blood counts completely recovered but also that his spleen started to enlarge again at time;ultrasound done on 01/08/2015 with spleen dimensions 17 x 6 x 17 cm and volume 1189 mL cubed. I wrote a new prescription  for 20 mg twice daily which he started taking on 01/31/2015. Blood counts stabilized. Ultrasound of the spleen done on 07/08/2015 showed spleen size mildly decreased compared with the March study now measuring 16 x 5 x 17 cm.  ultrasound done 06/30/2016: Spleen 18.3 x 16.2 x 4.9 cm. 758 mm.  Most recent ultrasound December 21, 2017 maximum splenic dimension 19 cm.  He has become habituated to oxycodone/acetominophen to control LUQ abdominal pain associated with his splenomegaly. 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 I have been able to taper him down to 60 tablets per month.  Interim History: He works at [employer].  The work is strenuous.  His main complaint today is fatigue.  He still reports left upper quadrant pain. Ultrasound done in anticipation of today's visit on January 3 shows maximum dimension of the spleen 21 cm.  Increased 2 cm compared with a March 2019 study. He still plans to reestablish with [facility].  He was evaluated in the past for potential allogeneic sibling transplant.  He missed scheduled appointments and was taken off a priority list.  Medications: reviewed  Allergies:  Allergies  Allergen Reactions  . Ibuprofen Other (See Comments)    Indigestion Indigestion    Review of Systems: See interim history Remaining ROS negative:   Physical Exam: Blood pressure (!) 141/80, pulse 86, temperature 98.5 F (36.9 C), temperature source Oral, height _0  (1.854 m), weight 207 lb 9.6 oz (94.2 kg), SpO2 100 %. Wt Readings from Last 3 Encounters:  12/10/18 207 lb 9.6 oz (94.2 kg)  09/09/18 200 lb 14.4 oz (91.1 kg)  08/29/18 201 lb 4.8 oz (91.3 kg)  General appearance: Muscular African-American man HENNT: Pharynx no erythema, exudate, mass, or ulcer. No thyromegaly or thyroid nodules Lymph nodes: No cervical, supraclavicular, or axillary lymphadenopathy Breasts:  Lungs: Clear to auscultation, resonant to percussion throughout Heart: Regular rhythm, no murmur, no gallop, no rub,  no click, no edema Abdomen: Soft, nontender, normal bowel sounds, no mass, no hepatomegaly.  Spleen 4-5 cm below left costal margin.  Smooth nontender edge. Extremities: No edema, no calf tenderness Musculoskeletal: no joint deformities GU:  Vascular: Carotid pulses 2+, no bruits, Neurologic: Alert, oriented, PERRLA,, cranial nerves grossly normal, motor strength 5 over 5, reflexes 1+ symmetric, upper body coordination normal, gait normal, Skin: No rash or ecchymosis.  Multiple tattoos.  Resolved molluscum rash on the scalp and neck.  Lab Results: CBC W/Diff    Component Value Date/Time   WBC 11.0 (H) 09/27/2018 1538   WBC 11.4 (H) 08/02/2018 2052   RBC 4.90 09/27/2018 1538   RBC 5.27 08/02/2018 2052   HGB 13.5 09/27/2018 1538   HGB 12.0 (L) 01/05/2014 1035   HCT 39.7 09/27/2018 1538   HCT 37.1 (L) 01/05/2014 1035   PLT 385 09/27/2018 1538   MCV 81 09/27/2018 1538   MCV 79.1 (L) 01/05/2014 1035   MCH 27.6 09/27/2018 1538   MCH 27.3 08/02/2018 2052   MCHC 34.0 09/27/2018 1538   MCHC 31.9 08/02/2018 2052   RDW 14.9 09/27/2018 1538   RDW 16.8 (H) 01/05/2014 1035   LYMPHSABS 1.0 09/27/2018 1538   LYMPHSABS 0.7 (L) 01/05/2014 1035   MONOABS 0.2 12/18/2017 0937   MONOABS 0.3 01/05/2014 1035   EOSABS 0.2 09/27/2018 1538   BASOSABS 0.1 09/27/2018 1538   BASOSABS 0.1 01/05/2014 1035     Chemistry      Component Value Date/Time   NA 140 09/27/2018 1538   NA 141 01/05/2014 1035   K 4.1 09/27/2018 1538   K 3.9 01/05/2014 1035   CL 100 09/27/2018 1538   CL 106 03/07/2013 0826   CO2 21 09/27/2018 1538   CO2 24 01/05/2014 1035   BUN 9 09/27/2018 1538   BUN 11.3 01/05/2014 1035   CREATININE 1.13 09/27/2018 1538   CREATININE 1.21 04/30/2015 1335   CREATININE 1.8 (H) 01/05/2014 1035      Component Value Date/Time   CALCIUM 8.7 09/27/2018 1538   CALCIUM 9.5 01/05/2014 1035   ALKPHOS 90 09/27/2018 1538   ALKPHOS 57 01/05/2014 1035   AST 20 09/27/2018 1538   AST 21 01/05/2014  1035   ALT 21 09/27/2018 1538   ALT 31 01/05/2014 1035   BILITOT 0.3 09/27/2018 1538   BILITOT 0.49 01/05/2014 1035       Radiological Studies: No results found.  Impression: 1. JAK-2 positive myeloproliferative disorder Slightly enlarged spleen size compared with 9 months ago. CBC remains stable with mild leukocytosis, mild thrombocytosis, and normal hemoglobin.  On September 27, 2018, white count differential with 83% neutrophils, 9 lymphocytes, 3 monocytes, 1 basophil, 2 eosinophils.  No blasts noted. Only concern is an unexpected rise in his serum LDH at 560.  Current lab normal up to 224.  Most recent prior value 389 on April 04, 2018. He was supposed to have labs again today but he left the office abruptly stating that his car was parked in a no parking zone and the had to check and make sure that it was not going to get towed.  He said he was going to come back to get the lab drawn but he did  not.  2.  Tylox habituation.  40 tablets/month to treat left upper quadrant pain related to chronic splenomegaly. State narcotic database reviewed.  No other prescribers than our center.  Tox assure positive for cocaine metabolites in April 2019.  Patient confronted.  Admitted to using this drug at a party.  Negative cocaine on repeat testing September 27, 2018.  Urine tox assure ordered again today but patient left office abruptly.  3.  Resolved molluscum contagiosum rash  I will transition his hematology care to Dr. Irene Limbo at the [facility] at this time.  CC: Patient Care Team: Jean Rosenthal, MD as PCP - General (Internal Medicine) [name], MD as Referring Physician (Internal Medicine)   Murriel Hopper, MD, [place]  Hematology-Oncology/Internal Medicine     2/18/20207:04 PM

## 2018-12-16 ENCOUNTER — Other Ambulatory Visit: Payer: BLUE CROSS/BLUE SHIELD

## 2018-12-17 ENCOUNTER — Telehealth: Payer: Self-pay | Admitting: *Deleted

## 2018-12-17 NOTE — Telephone Encounter (Signed)
Call / message from pt - he was unable to come for labs yesterday; but will be able to come Friday,his day off. Left message, he can come Friday @ 1000 AM.

## 2018-12-20 ENCOUNTER — Ambulatory Visit: Payer: BLUE CROSS/BLUE SHIELD

## 2018-12-20 ENCOUNTER — Other Ambulatory Visit: Payer: BLUE CROSS/BLUE SHIELD

## 2018-12-20 ENCOUNTER — Other Ambulatory Visit: Payer: Self-pay

## 2018-12-20 DIAGNOSIS — S71012D Laceration without foreign body, left hip, subsequent encounter: Secondary | ICD-10-CM

## 2018-12-20 NOTE — Telephone Encounter (Signed)
oxyCODONE-acetaminophen (PERCOCET) 10-325 MG tablet, refill request @  [id number] [place], [place] - [address] [phone] (Phone) [phone] (Fax)    

## 2018-12-20 NOTE — Telephone Encounter (Signed)
Pt presented to lab for labwork, c/o N&V, not feeling well, given [facility] appt, pt left a urine specimen and left clinic without being seen, pt had been actively regurgitating, given emesis bag, informed [facility] would see him, agreeable. 1045, [facility] appt dr Daryll Drown informed. When pt called he was gone from clinic. No labs drawn, he did leave urine.

## 2018-12-23 ENCOUNTER — Other Ambulatory Visit (INDEPENDENT_AMBULATORY_CARE_PROVIDER_SITE_OTHER): Payer: BLUE CROSS/BLUE SHIELD

## 2018-12-23 DIAGNOSIS — D47Z9 Other specified neoplasms of uncertain behavior of lymphoid, hematopoietic and related tissue: Secondary | ICD-10-CM

## 2018-12-23 DIAGNOSIS — C946 Myelodysplastic disease, not classified: Secondary | ICD-10-CM

## 2018-12-23 MED ORDER — OXYCODONE-ACETAMINOPHEN 10-325 MG PO TABS: 1.0000 | ORAL_TABLET | Freq: Two times a day (BID) | ORAL | 0 refills | Status: DC | PRN

## 2018-12-23 NOTE — Telephone Encounter (Addendum)
Last rx written 11/24/18. Last OV 12/10/18. UDS 12/20/18.  Pt is here this morning for labs.  Talked to Dr Darnell Level - add uric acid, LDH; Vickie ,lab, informed.

## 2018-12-24 ENCOUNTER — Telehealth: Payer: Self-pay | Admitting: *Deleted

## 2018-12-24 LAB — CMP14 + ANION GAP
ALT: 17 IU/L (ref 0–44)
AST: 15 IU/L (ref 0–40)
Albumin/Globulin Ratio: 1.6 (ref 1.2–2.2)
Albumin: 4.4 g/dL (ref 4.0–5.0)
Alkaline Phosphatase: 97 IU/L (ref 39–117)
Anion Gap: 17 mmol/L (ref 10.0–18.0)
BUN/Creatinine Ratio: 7 — ABNORMAL LOW (ref 9–20)
BUN: 8 mg/dL (ref 6–24)
Bilirubin Total: 0.4 mg/dL (ref 0.0–1.2)
CO2: 21 mmol/L (ref 20–29)
Calcium: 9.2 mg/dL (ref 8.7–10.2)
Chloride: 100 mmol/L (ref 96–106)
Creatinine, Ser: 1.11 mg/dL (ref 0.76–1.27)
GFR calc Af Amer: 92 mL/min/{1.73_m2} (ref >59)
GFR calc non Af Amer: 79 mL/min/{1.73_m2} (ref >59)
Globulin, Total: 2.8 g/dL (ref 1.5–4.5)
Glucose: 108 mg/dL — ABNORMAL HIGH (ref 65–99)
Potassium: 4 mmol/L (ref 3.5–5.2)
Sodium: 138 mmol/L (ref 134–144)
Total Protein: 7.2 g/dL (ref 6.0–8.5)

## 2018-12-24 LAB — CBC WITH DIFFERENTIAL/PLATELET
Basophils Absolute: 0.1 10*3/uL (ref 0.0–0.2)
Basos: 1 %
EOS (ABSOLUTE): 0.1 10*3/uL (ref 0.0–0.4)
Eos: 1 %
Hematocrit: 43.3 % (ref 37.5–51.0)
Hemoglobin: 14.9 g/dL (ref 13.0–17.7)
IMMATURE GRANULOCYTES: 1 %
Immature Grans (Abs): 0.1 10*3/uL (ref 0.0–0.1)
Lymphocytes Absolute: 0.5 10*3/uL — ABNORMAL LOW (ref 0.7–3.1)
Lymphs: 4 %
MCH: 26.9 pg (ref 26.6–33.0)
MCHC: 34.4 g/dL (ref 31.5–35.7)
MCV: 78 fL — ABNORMAL LOW (ref 79–97)
Monocytes Absolute: 0.3 10*3/uL (ref 0.1–0.9)
Monocytes: 2 %
NEUTROS PCT: 91 %
Neutrophils Absolute: 11.1 10*3/uL — ABNORMAL HIGH (ref 1.4–7.0)
Platelets: 332 10*3/uL (ref 150–450)
RBC: 5.53 x10E6/uL (ref 4.14–5.80)
RDW: 15.8 % — ABNORMAL HIGH (ref 11.6–15.4)
WBC: 12.2 10*3/uL — ABNORMAL HIGH (ref 3.4–10.8)

## 2018-12-24 LAB — URIC ACID: Uric Acid: 6.1 mg/dL (ref 3.7–8.6)

## 2018-12-24 LAB — LACTATE DEHYDROGENASE: LDH: 478 IU/L — ABNORMAL HIGH (ref 121–224)

## 2018-12-24 NOTE — Telephone Encounter (Signed)
Called pt, no answer; left message "lab all good " per Dr Beryle Beams on self- identified vm. And to call for any questions.

## 2018-12-24 NOTE — Telephone Encounter (Signed)
-----   Message from [name], MD sent at 12/24/2018  6:43 AM EST ----- Call pt: lab all good

## 2018-12-25 ENCOUNTER — Other Ambulatory Visit: Payer: Self-pay

## 2018-12-25 ENCOUNTER — Ambulatory Visit (INDEPENDENT_AMBULATORY_CARE_PROVIDER_SITE_OTHER): Payer: BLUE CROSS/BLUE SHIELD | Admitting: Internal Medicine

## 2018-12-25 VITALS — BP 138/67 | HR 85 | Temp 98.3°F | Ht 71.0 in | Wt 207.5 lb

## 2018-12-25 DIAGNOSIS — M546 Pain in thoracic spine: Secondary | ICD-10-CM

## 2018-12-25 MED ORDER — CYCLOBENZAPRINE HCL 5 MG PO TABS: 5.0000 mg | ORAL_TABLET | Freq: Every day | ORAL | 0 refills | Status: DC

## 2018-12-25 NOTE — Patient Instructions (Addendum)
Ms. Bilyk,  It was a pleasure meeting you today.  Please follow up in 6 months with your primary care doctor.

## 2018-12-26 DIAGNOSIS — M546 Pain in thoracic spine: Secondary | ICD-10-CM | POA: Insufficient documentation

## 2018-12-26 LAB — TOXASSURE SELECT,+ANTIDEPR,UR

## 2018-12-26 NOTE — Progress Notes (Signed)
   CC: back pain  HPI:  [name] is a 49 y.o. male with history noted below that presents to the acute care clinic for follow-up on back pain.  Please see problem based charting for the status of patient's medical conditions.  Past Medical History:  Diagnosis Date  . Asthma   . Asthma, cold induced   . Erectile dysfunction 06/05/2016  . Herpes   . 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 Hypertension   . Myelofibrosis ([place])    followed by Dr. Sherryl Manges  . No pertinent past medical history    enlarged spleen  . Spleen enlargement    myofibrosis need bonemarrow transplant  . 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  Musculoskeletal: Positive for back pain. Negative for falls and neck pain.  Neurological: Negative for tingling, sensory change and focal weakness.     Physical Exam:  Vitals:   12/25/18 1525  BP: 138/67  Pulse: 85  Temp: 98.3 F (36.8 C)  TempSrc: Oral  SpO2: 99%  Weight: 207 lb 8 oz (94.1 kg)  Height: 5\' 11"  (1.803 m)   Physical Exam  Constitutional: He is well-developed, well-nourished, and in no distress.  Cardiovascular: Normal rate, regular rhythm and normal heart sounds. Exam reveals no gallop and no friction rub.  No murmur heard. Pulmonary/Chest: Effort normal and breath sounds normal. No respiratory distress. He has no wheezes. He has no rales.  Musculoskeletal:        General: No edema.     Comments: Paraspinal musculature tenderness in thoracic region 5/5 motor strength in lower extremities bilaterally      Assessment & Plan:   See encounters tab for problem based medical decision making.   Patient discussed with Dr. Lynnae January

## 2018-12-26 NOTE — Assessment & Plan Note (Signed)
HPI:  Patient states that he's been having sharp back pain that is localized to the middle of his back for the past 6 days.  He states it is only present with movement.  He first noticed the pain when he was lifting heavy boxes at his job for [employer].  He states he was doing more lifting than usual one week ago.  He states the pain has improved since it first started.  He denies radicular pain, loss of sensation or weakness.  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  Assessment: Thoracic back pain Likely musculoskeletal.  Recommended a short course of Flexeril, heating pad and to use a back belt when going back to heavy lifting at work.  Plan -short course of flexeril -heating pad -back belt

## 2018-12-27 NOTE — Progress Notes (Signed)
Internal Medicine Clinic Attending  Case discussed with Dr. Hoffman at the time of the visit.  We reviewed the resident's history and exam and pertinent patient test results.  I agree with the assessment, diagnosis, and plan of care documented in the resident's note.  

## 2018-12-31 ENCOUNTER — Encounter: Payer: Self-pay | Admitting: Internal Medicine

## 2019-01-17 ENCOUNTER — Other Ambulatory Visit: Payer: Self-pay | Admitting: Oncology

## 2019-01-17 ENCOUNTER — Telehealth: Payer: Self-pay | Admitting: Internal Medicine

## 2019-01-17 DIAGNOSIS — S71012D Laceration without foreign body, left hip, subsequent encounter: Secondary | ICD-10-CM

## 2019-01-17 MED ORDER — OXYCODONE-ACETAMINOPHEN 10-325 MG PO TABS: 1.0000 | ORAL_TABLET | Freq: Two times a day (BID) | ORAL | 0 refills | Status: DC | PRN

## 2019-01-17 NOTE — Telephone Encounter (Signed)
No answer-HIPPA compliant message left on recorder.[name], Darlene Cassady3/27/202010:10 AM

## 2019-01-17 NOTE — Telephone Encounter (Signed)
Call made to patient-MD has approved one refill. Pt informed that Dr. Beryle Beams will no longer be refilling this medication (due to inappropriate UDS).

## 2019-01-17 NOTE — Telephone Encounter (Signed)
Pt is wanting a nurse to call him back; pt contact [phone]

## 2019-01-17 NOTE — Telephone Encounter (Signed)
I will refill current Rx. I retire January 22, 2019. I will no longer have an active DEA number and can no longer prescribe narcotics.

## 2019-01-17 NOTE — Telephone Encounter (Signed)
Pt calling refill on his percocet.

## 2019-01-22 ENCOUNTER — Telehealth: Payer: Self-pay | Admitting: Internal Medicine

## 2019-01-22 NOTE — Telephone Encounter (Signed)
Opened in error

## 2019-01-24 ENCOUNTER — Encounter: Payer: Self-pay | Admitting: *Deleted

## 2019-02-04 ENCOUNTER — Telehealth: Payer: Self-pay | Admitting: Hematology

## 2019-02-04 NOTE — Telephone Encounter (Signed)
A new patient appt has been scheduled for Sean Walter to see Dr. Irene Limbo on 4/23 at 11am. Pt aware to arrive 15 minutes early.

## 2019-02-13 ENCOUNTER — Inpatient Hospital Stay: Payer: BLUE CROSS/BLUE SHIELD | Admitting: Hematology

## 2019-02-13 ENCOUNTER — Telehealth: Payer: Self-pay | Admitting: Hematology

## 2019-02-13 ENCOUNTER — Other Ambulatory Visit: Payer: BLUE CROSS/BLUE SHIELD

## 2019-02-13 NOTE — Telephone Encounter (Signed)
Pt cld and r/s appt to see Dr. Irene Limbo on 4/30 at 11am.

## 2019-02-18 ENCOUNTER — Other Ambulatory Visit: Payer: Self-pay

## 2019-02-18 DIAGNOSIS — S71012D Laceration without foreign body, left hip, subsequent encounter: Secondary | ICD-10-CM

## 2019-02-18 MED ORDER — OXYCODONE-ACETAMINOPHEN 10-325 MG PO TABS: 1.0000 | ORAL_TABLET | Freq: Two times a day (BID) | ORAL | 0 refills | Status: DC | PRN

## 2019-02-18 NOTE — Telephone Encounter (Signed)
Last rx written  01/17/19. Last OV 12/25/18 with Dr Magdalene River. Next OV  Has not been scheduled. UDS  12/20/18.

## 2019-02-18 NOTE — Telephone Encounter (Signed)
oxyCODONE-acetaminophen (PERCOCET) 10-325 MG tablet(Expired)   Refill request @  [id number] [address], [place]. [phone] (Phone) [phone] (Fax)

## 2019-02-20 ENCOUNTER — Inpatient Hospital Stay: Payer: BLUE CROSS/BLUE SHIELD

## 2019-02-20 ENCOUNTER — Inpatient Hospital Stay: Payer: BLUE CROSS/BLUE SHIELD | Admitting: Hematology

## 2019-02-21 ENCOUNTER — Other Ambulatory Visit: Payer: Self-pay

## 2019-02-21 ENCOUNTER — Emergency Department (HOSPITAL_COMMUNITY)
Admission: EM | Admit: 2019-02-21 | Discharge: 2019-02-21 | Disposition: A | Discharge status: Home / Self Care | Payer: BLUE CROSS/BLUE SHIELD | Attending: Emergency Medicine | Admitting: Emergency Medicine

## 2019-02-21 DIAGNOSIS — F17210 Nicotine dependence, cigarettes, uncomplicated: Secondary | ICD-10-CM | POA: Insufficient documentation

## 2019-02-21 DIAGNOSIS — J45909 Unspecified asthma, uncomplicated: Secondary | ICD-10-CM | POA: Insufficient documentation

## 2019-02-21 DIAGNOSIS — Z79899 Other long term (current) drug therapy: Secondary | ICD-10-CM | POA: Diagnosis not present

## 2019-02-21 DIAGNOSIS — I10 Essential (primary) hypertension: Secondary | ICD-10-CM | POA: Insufficient documentation

## 2019-02-21 DIAGNOSIS — T50901A Poisoning by unspecified drugs, medicaments and biological substances, accidental (unintentional), initial encounter: Secondary | ICD-10-CM | POA: Diagnosis present

## 2019-02-21 LAB — COMPREHENSIVE METABOLIC PANEL
ALT: 44 U/L (ref 0–44)
AST: 45 U/L — ABNORMAL HIGH (ref 15–41)
Albumin: 4.1 g/dL (ref 3.5–5.0)
Alkaline Phosphatase: 92 U/L (ref 38–126)
Anion gap: 9 (ref 5–15)
BUN: 11 mg/dL (ref 6–20)
CO2: 25 mmol/L (ref 22–32)
Calcium: 8.5 mg/dL — ABNORMAL LOW (ref 8.9–10.3)
Chloride: 102 mmol/L (ref 98–111)
Creatinine, Ser: 1.22 mg/dL (ref 0.61–1.24)
GFR calc Af Amer: >60 mL/min (ref >60)
GFR calc non Af Amer: >60 mL/min (ref >60)
Glucose, Bld: 123 mg/dL — ABNORMAL HIGH (ref 70–99)
Potassium: 3.7 mmol/L (ref 3.5–5.1)
Sodium: 136 mmol/L (ref 135–145)
Total Bilirubin: 0.6 mg/dL (ref 0.3–1.2)
Total Protein: 7.7 g/dL (ref 6.5–8.1)

## 2019-02-21 LAB — CBC WITH DIFFERENTIAL/PLATELET
Abs Immature Granulocytes: 0.37 10*3/uL — ABNORMAL HIGH (ref 0.00–0.07)
Basophils Absolute: 0.1 10*3/uL (ref 0.0–0.1)
Basophils Relative: 1 %
Eosinophils Absolute: 0.1 10*3/uL (ref 0.0–0.5)
Eosinophils Relative: 1 %
HCT: 45.3 % (ref 39.0–52.0)
Hemoglobin: 14.2 g/dL (ref 13.0–17.0)
Immature Granulocytes: 3 %
Lymphocytes Relative: 4 %
Lymphs Abs: 0.5 10*3/uL — ABNORMAL LOW (ref 0.7–4.0)
MCH: 27.1 pg (ref 26.0–34.0)
MCHC: 31.3 g/dL (ref 30.0–36.0)
MCV: 86.5 fL (ref 80.0–100.0)
Monocytes Absolute: 0.4 10*3/uL (ref 0.1–1.0)
Monocytes Relative: 3 %
Neutro Abs: 11.1 10*3/uL — ABNORMAL HIGH (ref 1.7–7.7)
Neutrophils Relative %: 88 %
Platelets: 255 10*3/uL (ref 150–400)
RBC: 5.24 MIL/uL (ref 4.22–5.81)
RDW: 16 % — ABNORMAL HIGH (ref 11.5–15.5)
WBC: 12.7 10*3/uL — ABNORMAL HIGH (ref 4.0–10.5)
nRBC: 0.8 % — ABNORMAL HIGH (ref 0.0–0.2)

## 2019-02-21 LAB — ETHANOL: Alcohol, Ethyl (B): <10 mg/dL (ref <10)

## 2019-02-21 LAB — CBG MONITORING, ED: Glucose-Capillary: 107 mg/dL — ABNORMAL HIGH (ref 70–99)

## 2019-02-21 LAB — ACETAMINOPHEN LEVEL: Acetaminophen (Tylenol), Serum: <10 ug/mL — ABNORMAL LOW (ref 10–30)

## 2019-02-21 LAB — SALICYLATE LEVEL: Salicylate Lvl: <7 mg/dL (ref 2.8–30.0)

## 2019-02-21 MED ORDER — SODIUM CHLORIDE 0.9 % IV BOLUS
1000.0000 mL | Freq: Once | INTRAVENOUS | Status: AC
  Administered 2019-02-21: 21:00:00 1000 mL via INTRAVENOUS
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
## 2019-02-21 NOTE — ED Notes (Signed)
Pt verbalized discharge instructions and follow up care. Alert and ambulatory. IV removed 

## 2019-02-21 NOTE — ED Notes (Signed)
Pt informed that urine sample is needed. Pt stated that he is unable to provide sample right now and will asap.

## 2019-02-21 NOTE — ED Notes (Signed)
Pt requesting update from the PA and would like to leave. PA made aware

## 2019-02-21 NOTE — Discharge Instructions (Addendum)
You have been diagnosed today with accidental overdose.  At this time there does not appear to be the presence of an emergent medical condition, however there is always the potential for conditions to change. Please read and follow the below instructions.  Please return to the Emergency Department immediately for any new or worsening symptoms. Please be sure to follow up with your Primary Care Provider within one week regarding your visit today; please call their office to schedule an appointment even if you are feeling better for a follow-up visit. It is very dangerous to drink alcohol while taking narcotic/opioid medications as it can lead to drowsiness and for you to stop breathing.  Please speak with your primary care provider about your opiate use and about your visit today.  Return to emergency department immediately for any new or worsening symptoms.  Get help right away if: You think that you or someone else may have taken too much of a substance. You or someone else is having symptoms of drug poisoning. You have thoughts of hurting yourself or others Any new/concerning or worsening symptoms  Please read the additional information packets attached to your discharge summary.

## 2019-02-21 NOTE — ED Notes (Signed)
Bed: LD44 Expected date:  Expected time:  Means of arrival:  Comments: 76M OD- A&Ox4 now
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
## 2019-02-24 ENCOUNTER — Telehealth: Payer: Self-pay | Admitting: Hematology

## 2019-02-24 NOTE — Telephone Encounter (Signed)
Pt has been called and rescheduled to see Dr. Irene Limbo on 5/14 at 9am w/labs following the appt. Pt was very kind and apologized for not coming to his appt bc he wasn't feeling well. I explained to him that I completely understood and appreciated him for not coming in when he was unwell. He requested an early morning appt bc of his work schedule. I sent a msg to Dr. Grier Mitts nurse regarding the change and the patient's needs.

## 2019-03-05 ENCOUNTER — Other Ambulatory Visit: Payer: Self-pay | Admitting: *Deleted

## 2019-03-06 ENCOUNTER — Inpatient Hospital Stay: Payer: BLUE CROSS/BLUE SHIELD | Attending: Hematology | Admitting: Hematology

## 2019-03-06 ENCOUNTER — Inpatient Hospital Stay: Payer: BLUE CROSS/BLUE SHIELD

## 2019-03-06 NOTE — Addendum Note (Signed)
Addended by: Hulan Fray on: 03/06/2019 05:52 PM   Modules accepted: Orders

## 2019-03-06 NOTE — Addendum Note (Signed)
Addended by: Hulan Fray on: 03/06/2019 05:51 PM   Modules accepted: Orders

## 2019-03-20 ENCOUNTER — Other Ambulatory Visit: Payer: Self-pay | Admitting: Internal Medicine

## 2019-03-20 ENCOUNTER — Other Ambulatory Visit: Payer: Self-pay | Admitting: *Deleted

## 2019-03-20 ENCOUNTER — Telehealth: Payer: Self-pay | Admitting: Hematology

## 2019-03-20 DIAGNOSIS — S71012D Laceration without foreign body, left hip, subsequent encounter: Secondary | ICD-10-CM

## 2019-03-20 MED ORDER — OXYCODONE-ACETAMINOPHEN 10-325 MG PO TABS: 1.0000 | ORAL_TABLET | Freq: Two times a day (BID) | ORAL | 0 refills | Status: DC | PRN

## 2019-03-20 NOTE — Telephone Encounter (Signed)
Pt cld to reschedule his hem appt w/Dr. Irene Limbo. He apologized he's been having to cancel and reschedule his appt, but he has conflict w/being able to get off from work. I expressed understanding and reassured him it was ok. He's been rescheduled to see Dr. Irene Limbo on 6/22 at 1pm.   I made the mistake and gave the pt 6/20 at 1pm not realizing that date is a Saturday. I cld the pt and lft a vm w/the appt for 6/22 at 1pm. I asked that he return my call to confirm the appt.

## 2019-03-20 NOTE — Telephone Encounter (Signed)
Last rx written  02/18/19 Last OV 3/4 with Dr Heber Waverly Next OV f/u visit has not been scheduled. UDS  12/20/18

## 2019-03-27 ENCOUNTER — Other Ambulatory Visit: Payer: Self-pay

## 2019-03-27 ENCOUNTER — Ambulatory Visit (INDEPENDENT_AMBULATORY_CARE_PROVIDER_SITE_OTHER): Payer: BC Managed Care – PPO | Admitting: Internal Medicine

## 2019-03-27 DIAGNOSIS — G8911 Acute pain due to trauma: Secondary | ICD-10-CM

## 2019-03-27 DIAGNOSIS — S161XXA Strain of muscle, fascia and tendon at neck level, initial encounter: Secondary | ICD-10-CM | POA: Insufficient documentation

## 2019-03-27 DIAGNOSIS — M542 Cervicalgia: Secondary | ICD-10-CM | POA: Diagnosis not present
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
## 2019-03-27 NOTE — Patient Instructions (Signed)
It was nice seeing you today. Thank you for choosing [facility] for your Primary Care.   Today we talked about:  1) Neck strain:  - take ibuprofen 400mg  three times a day  - buy an over the counter muscle relaxer cream such as [facility] and use three times a day  - If you are not feeling better by Monday or Tuesday of next week give [place] a call  - If you notice arm weakness or numbness or increased pain give [place] a call    FOLLOW-UP INSTRUCTIONS When: as needed For: neck pain  Please contact the clinic if you have any problems, or need to be seen sooner.    Cervical Strain and Sprain Rehab Ask your health care provider which exercises are safe for you. Do exercises exactly as told by your health care provider and adjust them as directed. It is normal to feel mild stretching, pulling, tightness, or discomfort as you do these exercises, but you should stop right away if you feel sudden pain or your pain gets worse.Do not begin these exercises until told by your health care provider. Stretching and range of motion exercises These exercises warm up your muscles and joints and improve the movement and flexibility of your neck. These exercises also help to relieve pain, numbness, and tingling. Exercise A: Cervical side bend  1. Using good posture, sit on a stable chair or stand up. 2. Without moving your shoulders, slowly tilt your left / right ear to your shoulder until you feel a stretch in your neck muscles. You should be looking straight ahead. 3. Hold for __________ seconds. 4. Repeat with the other side of your neck. Repeat __________ times. Complete this exercise __________ times a day. Exercise B: Cervical rotation  1. Using good posture, sit on a stable chair or stand up. 2. Slowly turn your head to the side as if you are looking over your left / right shoulder. ? Keep your eyes level with the ground. ? Stop when you feel a stretch along the side and the back of your neck.  3. Hold for __________ seconds. 4. Repeat this by turning to your other side. Repeat __________ times. Complete this exercise __________ times a day. Exercise C: Thoracic extension and pectoral stretch 1. Roll a towel or a small blanket so it is about 4 inches (10 cm) in diameter. 2. Lie down on your back on a firm surface. 3. Put the towel lengthwise, under your spine in the middle of your back. It should not be not under your shoulder blades. The towel should line up with your spine from your middle back to your lower back. 4. Put your hands behind your head and let your elbows fall out to your sides. 5. Hold for __________ seconds. Repeat __________ times. Complete this exercise __________ times a day. Strengthening exercises These exercises build strength and endurance in your neck. Endurance is the ability to use your muscles for a long time, even after your muscles get tired. Exercise D: Upper cervical flexion, isometric 1. Lie on your back with a thin pillow behind your head and a small rolled-up towel under your neck. 2. Gently tuck your chin toward your chest and nod your head down to look toward your feet. Do not lift your head off the pillow. 3. Hold for __________ seconds. 4. Release the tension slowly. Relax your neck muscles completely before you repeat this exercise. Repeat __________ times. Complete this exercise __________ times a  day. Exercise E: Cervical extension, isometric  1. Stand about 6 inches (15 cm) away from a wall, with your back facing the wall. 2. Place a soft object, about 6-8 inches (15-20 cm) in diameter, between the back of your head and the wall. A soft object could be a small pillow, a ball, or a folded towel. 3. Gently tilt your head back and press into the soft object. Keep your jaw and forehead relaxed. 4. Hold for __________ seconds. 5. Release the tension slowly. Relax your neck muscles completely before you repeat this exercise. Repeat __________  times. Complete this exercise __________ times a day. Posture and body mechanics Body mechanics refers to the movements and positions of your body while you do your daily activities. Posture is part of body mechanics. Good posture and healthy body mechanics can help to relieve stress in your body's tissues and joints. Good posture means that your spine is in its natural S-curve position (your spine is neutral), your shoulders are pulled back slightly, and your head is not tipped forward. The following are general guidelines for applying improved posture and body mechanics to your everyday activities. Standing   When standing, keep your spine neutral and keep your feet about hip-width apart. Keep a slight bend in your knees. Your ears, shoulders, and hips should line up.  When you do a task in which you stand in one place for a long time, place one foot up on a stable object that is 2-4 inches (5-10 cm) high, such as a footstool. This helps keep your spine neutral. Sitting   When sitting, keep your spine neutral and your keep feet flat on the floor. Use a footrest, if necessary, and keep your thighs parallel to the floor. Avoid rounding your shoulders, and avoid tilting your head forward.  When working at a desk or a computer, keep your desk at a height where your hands are slightly lower than your elbows. Slide your chair under your desk so you are close enough to maintain good posture.  When working at a computer, place your monitor at a height where you are looking straight ahead and you do not have to tilt your head forward or downward to look at the screen. Resting When lying down and resting, avoid positions that are most painful for you. Try to support your neck in a neutral position. You can use a contour pillow or a small rolled-up towel. Your pillow should support your neck but not push on it. This information is not intended to replace advice given to you by your health care provider.  Make sure you discuss any questions you have with your health care provider. Document Released: 10/09/2005 Document Revised: 06/15/2016 Document Reviewed: 09/15/2015 Elsevier Interactive Patient Education  2019 [employer].
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
## 2019-03-28 NOTE — Progress Notes (Signed)
Internal Medicine Clinic Attending  Case discussed with Dr. Vogel  at the time of the visit.  We reviewed the resident's history and exam and pertinent patient test results.  I agree with the assessment, diagnosis, and plan of care documented in the resident's note.  

## 2019-04-01 ENCOUNTER — Telehealth: Payer: Self-pay | Admitting: Internal Medicine

## 2019-04-01 NOTE — Telephone Encounter (Signed)
Hi,   I was able to speak to Sean Walter and he tells me there is a section by his name was filled incorrectly.  He states that the date where medicine and all he needs is for the date to be corrected and refaxed.  Thanks

## 2019-04-01 NOTE — Telephone Encounter (Signed)
Pt calling back in reference to a Form [facility]).  Form is missing information and needs to be corrected.  Please call patient back as soon as possible.
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
## 2019-04-11 ENCOUNTER — Telehealth: Payer: Self-pay

## 2019-04-11 NOTE — Telephone Encounter (Signed)
Called and left voicemail regarding pre-screening questions for appt on 6/22  

## 2019-04-14 ENCOUNTER — Inpatient Hospital Stay: Payer: BC Managed Care – PPO

## 2019-04-14 ENCOUNTER — Telehealth: Payer: Self-pay | Admitting: Hematology

## 2019-04-14 ENCOUNTER — Other Ambulatory Visit: Payer: Self-pay

## 2019-04-14 ENCOUNTER — Inpatient Hospital Stay: Payer: BC Managed Care – PPO | Attending: Hematology | Admitting: Hematology

## 2019-04-14 VITALS — BP 140/83 | HR 97 | Temp 98.5°F | Resp 18 | Ht 73.0 in | Wt 202.5 lb

## 2019-04-14 DIAGNOSIS — R161 Splenomegaly, not elsewhere classified: Secondary | ICD-10-CM

## 2019-04-14 DIAGNOSIS — I10 Essential (primary) hypertension: Secondary | ICD-10-CM | POA: Diagnosis not present

## 2019-04-14 DIAGNOSIS — Z79899 Other long term (current) drug therapy: Secondary | ICD-10-CM | POA: Diagnosis not present

## 2019-04-14 DIAGNOSIS — J45909 Unspecified asthma, uncomplicated: Secondary | ICD-10-CM

## 2019-04-14 DIAGNOSIS — C946 Myelodysplastic disease, not classified: Secondary | ICD-10-CM | POA: Diagnosis not present

## 2019-04-14 DIAGNOSIS — D471 Chronic myeloproliferative disease: Secondary | ICD-10-CM

## 2019-04-14 DIAGNOSIS — F17210 Nicotine dependence, cigarettes, uncomplicated: Secondary | ICD-10-CM

## 2019-04-14 DIAGNOSIS — D47Z9 Other specified neoplasms of uncertain behavior of lymphoid, hematopoietic and related tissue: Secondary | ICD-10-CM

## 2019-04-14 LAB — CBC WITH DIFFERENTIAL/PLATELET
Abs Immature Granulocytes: 0.27 10*3/uL — ABNORMAL HIGH (ref 0.00–0.07)
Basophils Absolute: 0.1 10*3/uL (ref 0.0–0.1)
Basophils Relative: 1 %
Eosinophils Absolute: 0.1 10*3/uL (ref 0.0–0.5)
Eosinophils Relative: 1 %
HCT: 47.8 % (ref 39.0–52.0)
Hemoglobin: 14.8 g/dL (ref 13.0–17.0)
Immature Granulocytes: 2 %
Lymphocytes Relative: 4 %
Lymphs Abs: 0.6 10*3/uL — ABNORMAL LOW (ref 0.7–4.0)
MCH: 26.1 pg (ref 26.0–34.0)
MCHC: 31 g/dL (ref 30.0–36.0)
MCV: 84.3 fL (ref 80.0–100.0)
Monocytes Absolute: 0.3 10*3/uL (ref 0.1–1.0)
Monocytes Relative: 2 %
Neutro Abs: 12.3 10*3/uL — ABNORMAL HIGH (ref 1.7–7.7)
Neutrophils Relative %: 90 %
Platelets: 231 10*3/uL (ref 150–400)
RBC: 5.67 MIL/uL (ref 4.22–5.81)
RDW: 15.6 % — ABNORMAL HIGH (ref 11.5–15.5)
WBC: 13.6 10*3/uL — ABNORMAL HIGH (ref 4.0–10.5)
nRBC: 0.2 % (ref 0.0–0.2)

## 2019-04-14 LAB — CMP (CANCER CENTER ONLY)
ALT: 30 U/L (ref 0–44)
AST: 17 U/L (ref 15–41)
Albumin: 4 g/dL (ref 3.5–5.0)
Alkaline Phosphatase: 92 U/L (ref 38–126)
Anion gap: 9 (ref 5–15)
BUN: 8 mg/dL (ref 6–20)
CO2: 27 mmol/L (ref 22–32)
Calcium: 9.4 mg/dL (ref 8.9–10.3)
Chloride: 102 mmol/L (ref 98–111)
Creatinine: 1.21 mg/dL (ref 0.61–1.24)
GFR, Est AFR Am: >60 mL/min (ref >60)
GFR, Estimated: >60 mL/min (ref >60)
Glucose, Bld: 93 mg/dL (ref 70–99)
Potassium: 4.4 mmol/L (ref 3.5–5.1)
Sodium: 138 mmol/L (ref 135–145)
Total Bilirubin: 0.4 mg/dL (ref 0.3–1.2)
Total Protein: 8.2 g/dL — ABNORMAL HIGH (ref 6.5–8.1)

## 2019-04-14 LAB — RETICULOCYTES
Immature Retic Fract: 16.9 % — ABNORMAL HIGH (ref 2.3–15.9)
RBC.: 5.68 MIL/uL (ref 4.22–5.81)
Retic Count, Absolute: 55.7 10*3/uL (ref 19.0–186.0)
Retic Ct Pct: 1 % (ref 0.4–3.1)

## 2019-04-14 LAB — LACTATE DEHYDROGENASE: LDH: 612 U/L — ABNORMAL HIGH (ref 98–192)

## 2019-04-14 NOTE — Telephone Encounter (Signed)
Scheduled appt per 6/22 los. Left a voice message of appt date and time.

## 2019-04-14 NOTE — Progress Notes (Signed)
Patient in lobby following appt and requested reception staff contact Dr. Grier Mitts desk nurse to come talk with him. He states he will need a refill of Percocet this week. He states the doctor who prescribed it retired, stating it was Dr. Beryle Beams. Informed him that Dr.Kale will receive request.

## 2019-04-16 ENCOUNTER — Other Ambulatory Visit: Payer: Self-pay | Admitting: Internal Medicine

## 2019-04-16 ENCOUNTER — Other Ambulatory Visit: Payer: Self-pay | Admitting: *Deleted

## 2019-04-16 ENCOUNTER — Telehealth: Payer: Self-pay | Admitting: *Deleted

## 2019-04-16 DIAGNOSIS — S71012D Laceration without foreign body, left hip, subsequent encounter: Secondary | ICD-10-CM

## 2019-04-16 MED ORDER — OXYCODONE-ACETAMINOPHEN 10-325 MG PO TABS: 1.0000 | ORAL_TABLET | Freq: Two times a day (BID) | ORAL | 0 refills | Status: DC | PRN

## 2019-04-16 NOTE — Progress Notes (Signed)
Sean Walter has a history of myelodysplastic disorder with splenomegaly and opioid dependence due to chronic left upper quadrant pain.  He has had gradually worsening splenomegaly with recent abdominal ultrasound showing spleen size of 21 cm.  His recent tox assure came back positive for cocaine and I plan to have ongoing discussion with him regarding either tapering down his opioid though he is at high weeks for opioid withdrawal.  Also, I will have him personally see me in the clinic.  He does have an appointment with oncology in August 2020.

## 2019-04-16 NOTE — Telephone Encounter (Signed)
Message from pt - stated Dr Irene Limbo told him to ask his primary doctor for refill on his pain med. Last rx written  5/28. Last OV 6/4 with Dr Donne Hazel. Next OV has not been scheduled. UDS 12/20/18.

## 2019-04-16 NOTE — Telephone Encounter (Signed)
Received faxed request from [facility] for CBC w/diff results dated 04/14/2019 to complete specimen sent to [facility]. Faxed results [phone]. Fax confirmation received.

## 2019-04-16 NOTE — Telephone Encounter (Signed)
Patient contacted office after appointment to ask if Dr.Kale could refill his pain medicine, stating the doctor that usually did that was now retired. Per Dr.Kale, patient should contact his PCP for pain management. Contacted patient. Information from Dr. Irene Limbo given to patient who verbalized understanding.

## 2019-04-16 NOTE — Telephone Encounter (Signed)
Talked to pt about refill - stated he will call back to schedule his appt.

## 2019-04-16 NOTE — Telephone Encounter (Signed)
Hi Glenda,   I will refill the medication for now. I'd like for him to make an appointment to see me before his next refill  Thanks

## 2019-04-29 ENCOUNTER — Telehealth: Payer: Self-pay | Admitting: *Deleted

## 2019-04-29 NOTE — Telephone Encounter (Signed)
Pt is having trouble getting his JAKAFI 20mg , states when he calls he cant get anyone to help. Triage called the [facility], lm for sandra knisley rn to call back. Pt's call was lost in process of waiting to lm for sandrak. Rn, rtc to pt got his vmail and lm for rtc to triage. Gave sandra direct triage line to resolve this situation quickly

## 2019-04-29 NOTE — Telephone Encounter (Signed)
Spoke w/ sandra at dr Grier Mitts office, she states they are not really involved w/ his JAKAFI, called [facility], the rep I spoke w/ states they have been calling pt since 6/10 and he does not answer or call them back, they must speak w/ him before they can fill the med. Triage did need to be on hold at [facility] specialty for 6 mins before speaking to a rep but she was very informative and helpful, while on ph [facility] I tried to reach pt again for a 3 way call and he did not answer.

## 2019-04-29 NOTE — Telephone Encounter (Signed)
Pt finally rtc, gave him the ph# to [facility] specialty and informed him of rep statement. He states he will rtc to [facility]

## 2019-05-05 LAB — FOUNDATIONACT

## 2019-05-12 ENCOUNTER — Encounter: Payer: BLUE CROSS/BLUE SHIELD | Admitting: Hematology

## 2019-05-19 ENCOUNTER — Other Ambulatory Visit: Payer: Self-pay | Admitting: Internal Medicine

## 2019-05-19 DIAGNOSIS — S71012D Laceration without foreign body, left hip, subsequent encounter: Secondary | ICD-10-CM

## 2019-05-19 NOTE — Telephone Encounter (Signed)
Last rx written  04/16/19. Last OV Saw Dr Donne Hazel 03/27/19. Next OV has not been scheduled. UDS 12/20/18.

## 2019-05-19 NOTE — Telephone Encounter (Signed)
Needs refill on oxyCODONE-acetaminophen (PERCOCET) 10-325 MG tablet(Expired) ;pt contact [phone]  [id number] - [place], [place] - [address]

## 2019-05-20 MED ORDER — OXYCODONE-ACETAMINOPHEN 10-325 MG PO TABS: 1.0000 | ORAL_TABLET | Freq: Two times a day (BID) | ORAL | 0 refills | Status: DC | PRN

## 2019-05-21 ENCOUNTER — Ambulatory Visit (INDEPENDENT_AMBULATORY_CARE_PROVIDER_SITE_OTHER): Payer: BC Managed Care – PPO | Admitting: Internal Medicine

## 2019-05-21 ENCOUNTER — Encounter (INDEPENDENT_AMBULATORY_CARE_PROVIDER_SITE_OTHER): Payer: Self-pay

## 2019-05-21 ENCOUNTER — Encounter: Payer: Self-pay | Admitting: Internal Medicine

## 2019-05-21 ENCOUNTER — Other Ambulatory Visit: Payer: Self-pay

## 2019-05-21 VITALS — BP 142/80 | HR 79 | Temp 98.4°F | Ht 73.0 in | Wt 208.2 lb

## 2019-05-21 DIAGNOSIS — Z79891 Long term (current) use of opiate analgesic: Secondary | ICD-10-CM | POA: Diagnosis not present

## 2019-05-21 DIAGNOSIS — D469 Myelodysplastic syndrome, unspecified: Secondary | ICD-10-CM

## 2019-05-21 DIAGNOSIS — Z79899 Other long term (current) drug therapy: Secondary | ICD-10-CM | POA: Diagnosis not present

## 2019-05-21 DIAGNOSIS — D47Z9 Other specified neoplasms of uncertain behavior of lymphoid, hematopoietic and related tissue: Secondary | ICD-10-CM

## 2019-05-21 DIAGNOSIS — I10 Essential (primary) hypertension: Secondary | ICD-10-CM | POA: Diagnosis not present

## 2019-05-21 DIAGNOSIS — C946 Myelodysplastic disease, not classified: Secondary | ICD-10-CM

## 2019-05-21 MED ORDER — HYDROCHLOROTHIAZIDE 12.5 MG PO TABS: 12.5000 mg | ORAL_TABLET | Freq: Every day | ORAL | 3 refills | Status: DC

## 2019-05-21 NOTE — Assessment & Plan Note (Signed)
Essential hypertension: His blood pressure has been elevated at his past several visits with systolic blood pressure in the 140s.  He does tell me that at home his BP is usually low though unable to quantify.  His elevated BP could be due to whitecoat hypertension versus pain from splenomegaly.  BP Readings from Last 3 Encounters:  05/21/19 (!) 142/80  04/14/19 140/83  03/27/19 127/66   Plan: - Start HCTZ 12.5 mg daily
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
## 2019-05-21 NOTE — Patient Instructions (Addendum)
Mr. Dearinger,   It was a pleasure taking care of you today. Sorry to hear about your ongoing left side pain. I am hopeful that the [facility] and BM transplant will go well and your pain will improved.   Your blood pressure has been elevated and I would like to start you on a medication called Hydrochlorothiazide. Take one pill a day.   Take Care! Dr. Eileen Stanford  Please call the internal medicine center clinic if you have any questions or concerns, we may be able to help and keep you from a long and expensive emergency room wait. Our clinic and after hours phone number is [phone], the best time to call is Monday through Friday 9 am to 4 pm but there is always someone available 24/7 if you have an emergency. If you need medication refills please notify your pharmacy one week in advance and they will send [place] a request.

## 2019-05-21 NOTE — Assessment & Plan Note (Signed)
Myelo proliferative disorder: Mr. Sean Walter has a longstanding history of myeloproliferative disorder and has been controlled with Jakafi.  Unfortunately he has splenomegaly which has been causing him significant pain at the left side of his abdomen.  He works at [employer] 6 days a week and does quite a bit of heavy lifting.  In addition, he takes care of his young son and daughter at home and sometimes experiences left-sided abdominal pain.  He has been on oxycodone for this pain since 2018.  He is currently being worked up for bone marrow transplant [facility].  He tells me that his son was 37 years old and getting ready to enlist in the [employer] [facility] is a possible match and they are currently undergoing evaluation.  We did discuss expectation for possibly tapering down the oxycodone.  Plan: -Follow-up oncology (Dr. Velvet Bathe) -Follow-up with [facility] hematology and oncology for ongoing bone marrow transplant evaluation - Obtain tox-assure today

## 2019-05-21 NOTE — Progress Notes (Signed)
Internal Medicine Clinic Attending  Case discussed with Dr. Agyei at the time of the visit.  We reviewed the resident's history and exam and pertinent patient test results.  I agree with the assessment, diagnosis, and plan of care documented in the resident's note.    

## 2019-05-25 LAB — TOXASSURE SELECT,+ANTIDEPR,UR

## 2019-06-06 ENCOUNTER — Telehealth: Payer: Self-pay | Admitting: Hematology

## 2019-06-06 NOTE — Telephone Encounter (Signed)
Returned call re rescheduling 8/17 appointment due to needs after 3 pm. Left message for patient re latest lab/fu any day will be 2:30 pm. Patient asked to return call re keeping 8/17 or rescheduling for next available appointment at 2:30 pm, which will be 9/22.

## 2019-06-09 ENCOUNTER — Telehealth: Payer: Self-pay | Admitting: Hematology

## 2019-06-09 ENCOUNTER — Inpatient Hospital Stay: Payer: BC Managed Care – PPO | Admitting: Hematology

## 2019-06-09 ENCOUNTER — Inpatient Hospital Stay: Payer: BC Managed Care – PPO

## 2019-06-09 NOTE — Telephone Encounter (Signed)
Called pt per 8/17 sch message - no answer - left message for pt to call back

## 2019-06-12 ENCOUNTER — Telehealth: Payer: Self-pay | Admitting: Hematology

## 2019-06-12 NOTE — Telephone Encounter (Signed)
Left message - called pt per 8/20 sch message to reschedule missed appt - left message for patient to call back to reschedule.

## 2019-06-16 ENCOUNTER — Other Ambulatory Visit: Payer: Self-pay | Admitting: Internal Medicine

## 2019-06-16 DIAGNOSIS — S71012D Laceration without foreign body, left hip, subsequent encounter: Secondary | ICD-10-CM

## 2019-06-16 MED ORDER — OXYCODONE-ACETAMINOPHEN 10-325 MG PO TABS: 1.0000 | ORAL_TABLET | Freq: Two times a day (BID) | ORAL | 0 refills | Status: DC | PRN

## 2019-06-16 NOTE — Telephone Encounter (Signed)
Last rx written 05/20/19. Last OV 05/21/19. Next OV has not been scheduled. UDS 05/21/19.

## 2019-06-16 NOTE — Telephone Encounter (Signed)
Needs refill on oxyCODONE-acetaminophen (PERCOCET) 10-325 MG tablet [id number] - [place], [place] - [place]. ;pt contact [phone]

## 2019-07-03 ENCOUNTER — Other Ambulatory Visit: Payer: Self-pay | Admitting: *Deleted

## 2019-07-03 DIAGNOSIS — D47Z9 Other specified neoplasms of uncertain behavior of lymphoid, hematopoietic and related tissue: Secondary | ICD-10-CM

## 2019-07-03 DIAGNOSIS — C946 Myelodysplastic disease, not classified: Secondary | ICD-10-CM
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
## 2019-07-04 ENCOUNTER — Other Ambulatory Visit: Payer: Self-pay

## 2019-07-04 ENCOUNTER — Inpatient Hospital Stay (HOSPITAL_BASED_OUTPATIENT_CLINIC_OR_DEPARTMENT_OTHER): Payer: BC Managed Care – PPO | Admitting: Hematology

## 2019-07-04 ENCOUNTER — Inpatient Hospital Stay: Payer: BC Managed Care – PPO | Attending: Hematology

## 2019-07-04 VITALS — BP 140/84 | HR 86 | Temp 99.1°F | Resp 18 | Ht 73.0 in | Wt 198.8 lb

## 2019-07-04 DIAGNOSIS — R161 Splenomegaly, not elsewhere classified: Secondary | ICD-10-CM | POA: Insufficient documentation

## 2019-07-04 DIAGNOSIS — I10 Essential (primary) hypertension: Secondary | ICD-10-CM | POA: Insufficient documentation

## 2019-07-04 DIAGNOSIS — Z79899 Other long term (current) drug therapy: Secondary | ICD-10-CM | POA: Diagnosis not present

## 2019-07-04 DIAGNOSIS — Z1589 Genetic susceptibility to other disease: Secondary | ICD-10-CM

## 2019-07-04 DIAGNOSIS — F17210 Nicotine dependence, cigarettes, uncomplicated: Secondary | ICD-10-CM | POA: Insufficient documentation

## 2019-07-04 DIAGNOSIS — D471 Chronic myeloproliferative disease: Secondary | ICD-10-CM | POA: Diagnosis not present

## 2019-07-04 DIAGNOSIS — J45909 Unspecified asthma, uncomplicated: Secondary | ICD-10-CM | POA: Insufficient documentation

## 2019-07-04 DIAGNOSIS — C946 Myelodysplastic disease, not classified: Secondary | ICD-10-CM | POA: Diagnosis not present

## 2019-07-04 DIAGNOSIS — D47Z9 Other specified neoplasms of uncertain behavior of lymphoid, hematopoietic and related tissue: Secondary | ICD-10-CM

## 2019-07-04 LAB — CBC WITH DIFFERENTIAL (CANCER CENTER ONLY)
Abs Immature Granulocytes: 0.32 10*3/uL — ABNORMAL HIGH (ref 0.00–0.07)
Basophils Absolute: 0.1 10*3/uL (ref 0.0–0.1)
Basophils Relative: 1 %
Eosinophils Absolute: 0.1 10*3/uL (ref 0.0–0.5)
Eosinophils Relative: 1 %
HCT: 43.3 % (ref 39.0–52.0)
Hemoglobin: 13.8 g/dL (ref 13.0–17.0)
Immature Granulocytes: 2 %
Lymphocytes Relative: 5 %
Lymphs Abs: 0.7 10*3/uL (ref 0.7–4.0)
MCH: 26.7 pg (ref 26.0–34.0)
MCHC: 31.9 g/dL (ref 30.0–36.0)
MCV: 83.8 fL (ref 80.0–100.0)
Monocytes Absolute: 0.4 10*3/uL (ref 0.1–1.0)
Monocytes Relative: 3 %
Neutro Abs: 11.8 10*3/uL — ABNORMAL HIGH (ref 1.7–7.7)
Neutrophils Relative %: 88 %
Platelet Count: 201 10*3/uL (ref 150–400)
RBC: 5.17 MIL/uL (ref 4.22–5.81)
RDW: 15.9 % — ABNORMAL HIGH (ref 11.5–15.5)
WBC Count: 13.4 10*3/uL — ABNORMAL HIGH (ref 4.0–10.5)
nRBC: 0.2 % (ref 0.0–0.2)

## 2019-07-04 LAB — CMP (CANCER CENTER ONLY)
ALT: 23 U/L (ref 0–44)
AST: 17 U/L (ref 15–41)
Albumin: 4 g/dL (ref 3.5–5.0)
Alkaline Phosphatase: 91 U/L (ref 38–126)
Anion gap: 7 (ref 5–15)
BUN: 8 mg/dL (ref 6–20)
CO2: 28 mmol/L (ref 22–32)
Calcium: 9.1 mg/dL (ref 8.9–10.3)
Chloride: 104 mmol/L (ref 98–111)
Creatinine: 1.26 mg/dL — ABNORMAL HIGH (ref 0.61–1.24)
GFR, Est AFR Am: >60 mL/min (ref >60)
GFR, Estimated: >60 mL/min (ref >60)
Glucose, Bld: 107 mg/dL — ABNORMAL HIGH (ref 70–99)
Potassium: 4 mmol/L (ref 3.5–5.1)
Sodium: 139 mmol/L (ref 135–145)
Total Bilirubin: 0.6 mg/dL (ref 0.3–1.2)
Total Protein: 7.7 g/dL (ref 6.5–8.1)

## 2019-07-04 LAB — RETICULOCYTES
Immature Retic Fract: 16.1 % — ABNORMAL HIGH (ref 2.3–15.9)
RBC.: 5.24 MIL/uL (ref 4.22–5.81)
Retic Count, Absolute: 60.3 10*3/uL (ref 19.0–186.0)
Retic Ct Pct: 1.2 % (ref 0.4–3.1)

## 2019-07-04 LAB — LACTATE DEHYDROGENASE: LDH: 660 U/L — ABNORMAL HIGH (ref 98–192)

## 2019-07-07 ENCOUNTER — Telehealth: Payer: Self-pay | Admitting: Hematology

## 2019-07-07 NOTE — Telephone Encounter (Signed)
Scheduled appt per 9/11 los. ° °Sent a staff message and a calendar will be mailed out. °

## 2019-07-14 ENCOUNTER — Other Ambulatory Visit: Payer: Self-pay | Admitting: Internal Medicine

## 2019-07-14 DIAGNOSIS — S71012D Laceration without foreign body, left hip, subsequent encounter: Secondary | ICD-10-CM

## 2019-07-14 NOTE — Telephone Encounter (Signed)
Refill request  oxyCODONE-acetaminophen (PERCOCET) 10-325 MG tablet  [id number] [place], [place] - [address]

## 2019-07-16 MED ORDER — OXYCODONE-ACETAMINOPHEN 10-325 MG PO TABS: 1.0000 | ORAL_TABLET | Freq: Two times a day (BID) | ORAL | 0 refills | Status: DC | PRN

## 2019-08-12 ENCOUNTER — Other Ambulatory Visit: Payer: Self-pay | Admitting: Internal Medicine

## 2019-08-12 DIAGNOSIS — I10 Essential (primary) hypertension: Secondary | ICD-10-CM

## 2019-08-13 ENCOUNTER — Other Ambulatory Visit: Payer: Self-pay

## 2019-08-13 DIAGNOSIS — S71012D Laceration without foreign body, left hip, subsequent encounter: Secondary | ICD-10-CM

## 2019-08-13 MED ORDER — OXYCODONE-ACETAMINOPHEN 10-325 MG PO TABS: 1.0000 | ORAL_TABLET | Freq: Two times a day (BID) | ORAL | 0 refills | Status: DC | PRN

## 2019-08-13 NOTE — Telephone Encounter (Signed)
oxyCODONE-acetaminophen (PERCOCET) 10-325 MG tablet, refill request @  [id number] [place], [place] - [address] [phone] (Phone) [phone] (Fax)    

## 2019-08-13 NOTE — Telephone Encounter (Signed)
Last rx written  07/16/19. Last OV  05/21/19. Next OV has not been scheduled. UDS  05/21/19.

## 2019-09-04 ENCOUNTER — Telehealth: Payer: Self-pay | Admitting: Hematology

## 2019-09-04 NOTE — Telephone Encounter (Signed)
[place] PAL 11/20 moved lab/fu from 11/20 to 11/30. Left message for patient. Schedule mailed.

## 2019-09-08 ENCOUNTER — Other Ambulatory Visit: Payer: Self-pay

## 2019-09-08 DIAGNOSIS — S71012D Laceration without foreign body, left hip, subsequent encounter: Secondary | ICD-10-CM

## 2019-09-08 NOTE — Telephone Encounter (Signed)
oxyCODONE-acetaminophen (PERCOCET) 10-325 MG tablet   REFILL REQUEST @  [id number] [place], [place]. [phone] (Phone) [phone] (Fax)

## 2019-09-10 MED ORDER — OXYCODONE-ACETAMINOPHEN 10-325 MG PO TABS: 1.0000 | ORAL_TABLET | Freq: Two times a day (BID) | ORAL | 0 refills | Status: DC | PRN

## 2019-09-12 ENCOUNTER — Ambulatory Visit: Payer: BC Managed Care – PPO | Admitting: Hematology

## 2019-09-12 ENCOUNTER — Other Ambulatory Visit: Payer: BC Managed Care – PPO

## 2019-09-22 ENCOUNTER — Other Ambulatory Visit: Payer: Self-pay

## 2019-09-22 ENCOUNTER — Inpatient Hospital Stay (HOSPITAL_BASED_OUTPATIENT_CLINIC_OR_DEPARTMENT_OTHER): Payer: BC Managed Care – PPO | Admitting: Hematology

## 2019-09-22 ENCOUNTER — Inpatient Hospital Stay: Payer: BC Managed Care – PPO | Attending: Hematology

## 2019-09-22 ENCOUNTER — Encounter: Payer: Self-pay | Admitting: *Deleted

## 2019-09-22 VITALS — BP 141/82 | HR 71 | Temp 98.7°F | Resp 18 | Ht 73.0 in | Wt 209.7 lb

## 2019-09-22 DIAGNOSIS — F17210 Nicotine dependence, cigarettes, uncomplicated: Secondary | ICD-10-CM | POA: Insufficient documentation

## 2019-09-22 DIAGNOSIS — Z79899 Other long term (current) drug therapy: Secondary | ICD-10-CM | POA: Diagnosis not present

## 2019-09-22 DIAGNOSIS — C946 Myelodysplastic disease, not classified: Secondary | ICD-10-CM | POA: Insufficient documentation

## 2019-09-22 DIAGNOSIS — Z1589 Genetic susceptibility to other disease: Secondary | ICD-10-CM | POA: Diagnosis not present

## 2019-09-22 DIAGNOSIS — D471 Chronic myeloproliferative disease: Secondary | ICD-10-CM

## 2019-09-22 DIAGNOSIS — I10 Essential (primary) hypertension: Secondary | ICD-10-CM | POA: Insufficient documentation

## 2019-09-22 LAB — CBC WITH DIFFERENTIAL/PLATELET
Abs Immature Granulocytes: 0.61 10*3/uL — ABNORMAL HIGH (ref 0.00–0.07)
Basophils Absolute: 0.1 10*3/uL (ref 0.0–0.1)
Basophils Relative: 1 %
Eosinophils Absolute: 0.1 10*3/uL (ref 0.0–0.5)
Eosinophils Relative: 1 %
HCT: 42.2 % (ref 39.0–52.0)
Hemoglobin: 13.4 g/dL (ref 13.0–17.0)
Immature Granulocytes: 6 %
Lymphocytes Relative: 7 %
Lymphs Abs: 0.8 10*3/uL (ref 0.7–4.0)
MCH: 26.3 pg (ref 26.0–34.0)
MCHC: 31.8 g/dL (ref 30.0–36.0)
MCV: 82.9 fL (ref 80.0–100.0)
Monocytes Absolute: 0.4 10*3/uL (ref 0.1–1.0)
Monocytes Relative: 3 %
Neutro Abs: 8.8 10*3/uL — ABNORMAL HIGH (ref 1.7–7.7)
Neutrophils Relative %: 82 %
Platelets: 172 10*3/uL (ref 150–400)
RBC: 5.09 MIL/uL (ref 4.22–5.81)
RDW: 16.5 % — ABNORMAL HIGH (ref 11.5–15.5)
WBC: 10.7 10*3/uL — ABNORMAL HIGH (ref 4.0–10.5)
nRBC: 0.4 % — ABNORMAL HIGH (ref 0.0–0.2)

## 2019-09-22 LAB — CMP (CANCER CENTER ONLY)
ALT: 30 U/L (ref 0–44)
AST: 22 U/L (ref 15–41)
Albumin: 3.9 g/dL (ref 3.5–5.0)
Alkaline Phosphatase: 81 U/L (ref 38–126)
Anion gap: 12 (ref 5–15)
BUN: 9 mg/dL (ref 6–20)
CO2: 23 mmol/L (ref 22–32)
Calcium: 8.8 mg/dL — ABNORMAL LOW (ref 8.9–10.3)
Chloride: 105 mmol/L (ref 98–111)
Creatinine: 1.15 mg/dL (ref 0.61–1.24)
GFR, Est AFR Am: >60 mL/min (ref >60)
GFR, Estimated: >60 mL/min (ref >60)
Glucose, Bld: 100 mg/dL — ABNORMAL HIGH (ref 70–99)
Potassium: 3.8 mmol/L (ref 3.5–5.1)
Sodium: 140 mmol/L (ref 135–145)
Total Bilirubin: 0.5 mg/dL (ref 0.3–1.2)
Total Protein: 7.8 g/dL (ref 6.5–8.1)

## 2019-09-22 LAB — LACTATE DEHYDROGENASE: LDH: 685 U/L — ABNORMAL HIGH (ref 98–192)

## 2019-09-22 MED ORDER — ALBUTEROL SULFATE HFA 108 (90 BASE) MCG/ACT IN AERS: 2.0000 | INHALATION_SPRAY | Freq: Four times a day (QID) | RESPIRATORY_TRACT | 0 refills | Status: AC | PRN
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
## 2019-09-23 ENCOUNTER — Telehealth: Payer: Self-pay | Admitting: Hematology

## 2019-09-23 NOTE — Telephone Encounter (Signed)
Scheduled appt per 11/30 los.  Spoke with pt and they are aware of the appt date and time,

## 2019-10-06 ENCOUNTER — Other Ambulatory Visit: Payer: Self-pay | Admitting: Internal Medicine

## 2019-10-06 DIAGNOSIS — S71012D Laceration without foreign body, left hip, subsequent encounter: Secondary | ICD-10-CM

## 2019-10-06 NOTE — Telephone Encounter (Signed)
Last rx written 09/10/19. Last OV  05/21/19. Next OV on f/u appt has been scheduled. UDS 05/21/19.

## 2019-10-06 NOTE — Telephone Encounter (Signed)
Needs refill on oxyCODONE-acetaminophen (PERCOCET) 10-325 MG tablet ;pt contact [phone]   [id number] [place], [place] - [place].

## 2019-10-08 MED ORDER — OXYCODONE-ACETAMINOPHEN 10-325 MG PO TABS: 1.0000 | ORAL_TABLET | Freq: Two times a day (BID) | ORAL | 0 refills | Status: DC | PRN

## 2019-11-03 ENCOUNTER — Other Ambulatory Visit: Payer: Self-pay | Admitting: Internal Medicine

## 2019-11-03 ENCOUNTER — Telehealth: Payer: Self-pay | Admitting: *Deleted

## 2019-11-03 DIAGNOSIS — D47Z9 Other specified neoplasms of uncertain behavior of lymphoid, hematopoietic and related tissue: Secondary | ICD-10-CM

## 2019-11-03 DIAGNOSIS — C946 Myelodysplastic disease, not classified: Secondary | ICD-10-CM

## 2019-11-03 DIAGNOSIS — S71012D Laceration without foreign body, left hip, subsequent encounter: Secondary | ICD-10-CM

## 2019-11-03 MED ORDER — RUXOLITINIB PHOSPHATE 20 MG PO TABS: ORAL_TABLET | ORAL | 1 refills | Status: DC

## 2019-11-03 NOTE — Telephone Encounter (Signed)
Refill Request   oxyCODONE-acetaminophen (PERCOCET) 10-325 MG tablet  [id number] [place], [place] - [address]

## 2019-11-03 NOTE — Telephone Encounter (Signed)
Patient called - requested refill of JAKAFI. Stated the previous prescription had previous provider name on it (Dr. Beryle Beams) and he didn't know if Dr. Irene Limbo would get it.  JAKAFI refilled per Dr. Irene Limbo OV note 09/22/2019: "Continue Jakafi 20 mg po BID"

## 2019-11-05 MED ORDER — OXYCODONE-ACETAMINOPHEN 10-325 MG PO TABS: 1.0000 | ORAL_TABLET | Freq: Two times a day (BID) | ORAL | 0 refills | Status: DC | PRN

## 2019-11-24 ENCOUNTER — Inpatient Hospital Stay: Payer: BC Managed Care – PPO | Admitting: Hematology

## 2019-11-24 ENCOUNTER — Inpatient Hospital Stay: Payer: BC Managed Care – PPO

## 2019-11-24 ENCOUNTER — Telehealth: Payer: Self-pay | Admitting: Hematology

## 2019-11-24 NOTE — Telephone Encounter (Signed)
Rescheduled per 1/29 sch msg, pt req. Called and left a msg. Mailing printout

## 2019-12-02 ENCOUNTER — Other Ambulatory Visit: Payer: Self-pay | Admitting: Internal Medicine

## 2019-12-02 DIAGNOSIS — S71012D Laceration without foreign body, left hip, subsequent encounter: Secondary | ICD-10-CM

## 2019-12-02 NOTE — Telephone Encounter (Signed)
Refill Request  oxyCODONE-acetaminophen (PERCOCET) 10-325 MG tablet  [id number] [place], [place] - [address]

## 2019-12-03 MED ORDER — OXYCODONE-ACETAMINOPHEN 10-325 MG PO TABS: 1.0000 | ORAL_TABLET | Freq: Two times a day (BID) | ORAL | 0 refills | Status: DC | PRN

## 2019-12-04 ENCOUNTER — Other Ambulatory Visit: Payer: Self-pay | Admitting: Internal Medicine

## 2019-12-04 ENCOUNTER — Telehealth: Payer: Self-pay

## 2019-12-04 DIAGNOSIS — Z79891 Long term (current) use of opiate analgesic: Secondary | ICD-10-CM

## 2019-12-04 NOTE — Telephone Encounter (Signed)
Requesting to speak with a nurse about oxyCODONE-acetaminophen (PERCOCET) 10-325 MG tablet. Pt states he only received 50 tablets instead of 60. Please call back.

## 2019-12-05 ENCOUNTER — Telehealth: Payer: Self-pay | Admitting: Hematology

## 2019-12-05 NOTE — Telephone Encounter (Signed)
Rescheduled 02/17 appointments to 02/19 per patient's request.

## 2019-12-05 NOTE — Telephone Encounter (Signed)
Returned patient's phone call regarding rescheduling an appointment, left a voicemail. 

## 2019-12-09 ENCOUNTER — Ambulatory Visit (HOSPITAL_COMMUNITY): Payer: BC Managed Care – PPO | Attending: Hematology

## 2019-12-09 NOTE — Telephone Encounter (Signed)
r agyei stated to nurse he would call pt and explain the tapering

## 2019-12-10 ENCOUNTER — Inpatient Hospital Stay: Payer: BC Managed Care – PPO | Admitting: Hematology

## 2019-12-10 ENCOUNTER — Inpatient Hospital Stay: Payer: BC Managed Care – PPO

## 2019-12-11 ENCOUNTER — Telehealth: Payer: Self-pay | Admitting: *Deleted

## 2019-12-11 NOTE — Telephone Encounter (Signed)
Contacted patient- no answer - LVM on named VM:  Appointments scheduled on 2/19 for lab-0800 and Dr. Harmon Pier will be cancelled.  Due to inclement [place] will open at [place] on 2/19. Schedule message sent to reschedule these appointments and scheduler will contact patient to reschedule. Dr. Irene Limbo would like for patient to have U/S of ABD completed before appt with him. Encouraged to contact office for questions

## 2019-12-12 ENCOUNTER — Inpatient Hospital Stay: Payer: BC Managed Care – PPO | Admitting: Hematology

## 2019-12-12 ENCOUNTER — Inpatient Hospital Stay: Payer: BC Managed Care – PPO

## 2019-12-24 ENCOUNTER — Other Ambulatory Visit: Payer: Self-pay | Admitting: Internal Medicine

## 2019-12-24 DIAGNOSIS — S71012D Laceration without foreign body, left hip, subsequent encounter: Secondary | ICD-10-CM

## 2019-12-24 MED ORDER — OXYCODONE-ACETAMINOPHEN 10-325 MG PO TABS: 1.0000 | ORAL_TABLET | Freq: Two times a day (BID) | ORAL | 0 refills | Status: DC | PRN

## 2019-12-24 NOTE — Telephone Encounter (Signed)
Need refill on oxyCODONE-acetaminophen (PERCOCET) 10-325 MG tablet  ;pt contact [phone]   [id number] [place] - [place].

## 2019-12-24 NOTE — Telephone Encounter (Signed)
Last rx written 12/03/19. Last OV 05/21/19. Next OV has not been scheduled. UDS  05/21/19.

## 2020-01-26 ENCOUNTER — Other Ambulatory Visit: Payer: Self-pay | Admitting: Internal Medicine

## 2020-01-26 DIAGNOSIS — S71012D Laceration without foreign body, left hip, subsequent encounter: Secondary | ICD-10-CM

## 2020-01-26 MED ORDER — OXYCODONE-ACETAMINOPHEN 10-325 MG PO TABS: 1.0000 | ORAL_TABLET | Freq: Two times a day (BID) | ORAL | 0 refills | Status: DC | PRN

## 2020-01-26 NOTE — Telephone Encounter (Signed)
Need refill on oxyCODONE-acetaminophen (PERCOCET) 10-325 MG tablet(Expired) ;pt contact [phone]   [id number] [place], [place] - [address]

## 2020-01-29 ENCOUNTER — Ambulatory Visit (HOSPITAL_COMMUNITY)
Admission: RE | Admit: 2020-01-29 | Discharge: 2020-01-29 | Disposition: A | Discharge status: Home / Self Care | Payer: BC Managed Care – PPO | Source: Ambulatory Visit | Attending: Hematology | Admitting: Hematology

## 2020-01-29 ENCOUNTER — Other Ambulatory Visit: Payer: Self-pay

## 2020-01-29 DIAGNOSIS — D471 Chronic myeloproliferative disease: Secondary | ICD-10-CM | POA: Insufficient documentation

## 2020-01-29 DIAGNOSIS — Z1589 Genetic susceptibility to other disease: Secondary | ICD-10-CM | POA: Diagnosis present

## 2020-02-04 ENCOUNTER — Inpatient Hospital Stay: Payer: BC Managed Care – PPO | Admitting: Hematology

## 2020-02-20 ENCOUNTER — Telehealth: Payer: Self-pay | Admitting: Hematology

## 2020-02-20 ENCOUNTER — Inpatient Hospital Stay: Payer: BC Managed Care – PPO | Admitting: Hematology

## 2020-02-20 NOTE — Telephone Encounter (Signed)
Canceled appt per pt vmail this morning 4/30 - pt unable to come in . Left message for patient to call back for reschedule

## 2020-02-25 ENCOUNTER — Other Ambulatory Visit: Payer: Self-pay

## 2020-02-25 DIAGNOSIS — S71012D Laceration without foreign body, left hip, subsequent encounter: Secondary | ICD-10-CM

## 2020-02-25 MED ORDER — OXYCODONE-ACETAMINOPHEN 10-325 MG PO TABS: 1.0000 | ORAL_TABLET | Freq: Two times a day (BID) | ORAL | 0 refills | Status: DC | PRN

## 2020-02-25 NOTE — Telephone Encounter (Signed)
Hi,   Can he be scheduled for follow up to get a UDS?  Thanks

## 2020-02-25 NOTE — Telephone Encounter (Signed)
oxyCODONE-acetaminophen (PERCOCET) 10-325 MG tablet, refill request @  [id number] [place], [place]. [phone] (Phone) [phone] (Fax)

## 2020-02-25 NOTE — Telephone Encounter (Signed)
Last rx written  01/26/20. Last OV 12/04/19. Next OV has not been scheduled. UDS 05/21/19.

## 2020-02-26 NOTE — Telephone Encounter (Signed)
The patient has sch a lab appt for 03/01/2020.

## 2020-02-27 ENCOUNTER — Encounter: Payer: Self-pay | Admitting: Physical Medicine and Rehabilitation

## 2020-02-27 ENCOUNTER — Other Ambulatory Visit: Payer: Self-pay | Admitting: Internal Medicine

## 2020-02-27 DIAGNOSIS — Z79891 Long term (current) use of opiate analgesic: Secondary | ICD-10-CM

## 2020-03-01 ENCOUNTER — Other Ambulatory Visit: Payer: BC Managed Care – PPO

## 2020-03-01 DIAGNOSIS — Z79891 Long term (current) use of opiate analgesic: Secondary | ICD-10-CM

## 2020-03-01 NOTE — Addendum Note (Signed)
Addended by: Truddie Crumble on: 03/01/2020 08:31 AM   Modules accepted: Orders

## 2020-03-05 LAB — TOXASSURE SELECT,+ANTIDEPR,UR

## 2020-03-08 NOTE — Progress Notes (Signed)
Patient with chronic oxycodone therapy due to pain related to chronic splenomegaly and myelofibrosis. Toxassure is inappropriate, shows oxycodone present but no metabolites present. This could be consistent with sample tampering ("spiking") which can be used to obscure diversion. Dr. Eileen Stanford will need to talk with him directly about this, and recheck another toxassure quickly. If there is high suspicion for diversion, we will need to stop prescribing oxycodone.

## 2020-03-16 ENCOUNTER — Encounter
Payer: BC Managed Care – PPO | Attending: Physical Medicine and Rehabilitation | Admitting: Physical Medicine and Rehabilitation

## 2020-03-16 ENCOUNTER — Other Ambulatory Visit: Payer: Self-pay

## 2020-03-16 ENCOUNTER — Encounter: Payer: Self-pay | Admitting: Physical Medicine and Rehabilitation

## 2020-03-16 VITALS — BP 137/87 | HR 81 | Temp 97.2°F | Ht 73.0 in | Wt 205.6 lb

## 2020-03-16 DIAGNOSIS — R161 Splenomegaly, not elsewhere classified: Secondary | ICD-10-CM | POA: Diagnosis not present

## 2020-03-16 DIAGNOSIS — Z789 Other specified health status: Secondary | ICD-10-CM

## 2020-03-16 DIAGNOSIS — Z7289 Other problems related to lifestyle: Secondary | ICD-10-CM | POA: Diagnosis present

## 2020-03-16 DIAGNOSIS — D47Z9 Other specified neoplasms of uncertain behavior of lymphoid, hematopoietic and related tissue: Secondary | ICD-10-CM | POA: Diagnosis present

## 2020-03-16 DIAGNOSIS — G894 Chronic pain syndrome: Secondary | ICD-10-CM | POA: Diagnosis present

## 2020-03-16 DIAGNOSIS — Z5181 Encounter for therapeutic drug level monitoring: Secondary | ICD-10-CM | POA: Diagnosis present

## 2020-03-16 DIAGNOSIS — C946 Myelodysplastic disease, not classified: Secondary | ICD-10-CM

## 2020-03-16 DIAGNOSIS — Z79891 Long term (current) use of opiate analgesic: Secondary | ICD-10-CM | POA: Diagnosis present
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
## 2020-03-17 NOTE — Addendum Note (Signed)
Addended by: Caro Hight on: 03/17/2020 01:45 PM   Modules accepted: Orders

## 2020-03-19 ENCOUNTER — Telehealth: Payer: Self-pay | Admitting: Physical Medicine and Rehabilitation

## 2020-03-19 NOTE — Telephone Encounter (Signed)
Called pt back to see what medications he is referring to. No answer, no VM

## 2020-03-19 NOTE — Telephone Encounter (Signed)
Patient would like to get Refill of Prescription. States he did not receive enough from his PCP.

## 2020-03-22 ENCOUNTER — Other Ambulatory Visit: Payer: Self-pay | Admitting: Internal Medicine

## 2020-03-23 ENCOUNTER — Other Ambulatory Visit: Payer: Self-pay | Admitting: Internal Medicine

## 2020-03-23 DIAGNOSIS — Z79891 Long term (current) use of opiate analgesic: Secondary | ICD-10-CM

## 2020-03-23 LAB — TOXASSURE SELECT,+ANTIDEPR,UR

## 2020-03-24 ENCOUNTER — Telehealth: Payer: Self-pay | Admitting: *Deleted

## 2020-03-24 NOTE — Telephone Encounter (Addendum)
Urine drug screen is positive for cocaine and fentanyl which are not prescribed medications. Inconsistent drug screen. This was done 3 days after initial visit. In reviewing previous screens, he has tested positive for cocaine and its metabolite in multiple previous screens.

## 2020-03-25 NOTE — Telephone Encounter (Signed)
I am sure it is his percocet he is calling about but his drug screen was not appropriate and that was sent to Dr Ranell Patrick.

## 2020-03-26 NOTE — Telephone Encounter (Signed)
Yes we cannot prescribe narcotics for him given cocaine in his urine screen. If he would like to try non-narcotic options I would be happy to help him, thank you!

## 2020-03-30 ENCOUNTER — Encounter: Payer: Self-pay | Admitting: Internal Medicine

## 2020-03-30 ENCOUNTER — Other Ambulatory Visit: Payer: Self-pay

## 2020-03-30 ENCOUNTER — Telehealth: Payer: Self-pay | Admitting: *Deleted

## 2020-03-30 DIAGNOSIS — S71012D Laceration without foreign body, left hip, subsequent encounter: Secondary | ICD-10-CM

## 2020-03-30 NOTE — Telephone Encounter (Signed)
Dr Eileen Stanford, spoke w/ dr Heber Laplace and he agrees that the pcp needs to give pt the decision not to provide narcotics to him any longer. Could you please call him this evening and speak w/ him. thanks

## 2020-03-30 NOTE — Telephone Encounter (Signed)
oxyCODONE-acetaminophen (PERCOCET) 10-325 MG tablet(Expired,refill request @  [id number] [address], [place]. [phone] (Phone) [phone] (Fax

## 2020-03-30 NOTE — Telephone Encounter (Signed)
2nd call from pt-wants to know if he still needs to come in to provide urine specimen.  Pt's pcp unavailable at this time, will send to attending for review.  Of note, pt does have future orders on file for a ToxAssure.Despina Hidden Cassady6/8/20212:55 PM

## 2020-03-30 NOTE — Telephone Encounter (Signed)
Hi Helen,   I will reach out to him.

## 2020-03-30 NOTE — Telephone Encounter (Signed)
I called Sean Walter today and spent over >30 mins talking to him. He was upset about the decision as expected and he disagreed with the U-Tox results. He became extremely upset. I made him aware that [facility] will no longer be prescribing him his narcotics and that he should follow up with Pain Medicine. He states that he will follow up with [facility] and I was adamant that the decision was final.

## 2020-03-30 NOTE — Progress Notes (Unsigned)
I called Sean Walter today and spent over >30 mins talking to him. He was upset about the decision as expected and he disagreed with the Toxicolgy results. He became extremely upset. I made him aware that [facility] will no longer be prescribing him his narcotics and that he should follow up with Pain Medicine. He states that he will follow up with [facility] and I was adamant that the decision was final.

## 2020-03-30 NOTE — Telephone Encounter (Signed)
Reviewed recent notes and Utox. 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 It appears we referred him to a pain clinic specialist who he saw last month. He had two urine tox screens last month, both of which were inappropriate. Both positive for high levels of oxycodone but absent for metabolites. This is concerning spiking of his urine.  His last UDS was positive for cocaine as well. Given these results, I do not believe patient is consistently taking oxycodone and I am concerned for diversion. Refill declined. [facility] will no longer be refilling his oxycodone.

## 2020-03-30 NOTE — Telephone Encounter (Signed)
Pt also requesting return to [facility] to provide a urine specimen for UDS.  Request was sent to pcp for review...[name] KitchenMarland Kitchenplease see 03/24/20 telephone encounter and additional notes regarding UDS collected by [facility].  Please advise.Despina Hidden Cassady6/8/202112:16 PM

## 2020-03-31 NOTE — Telephone Encounter (Addendum)
Call from pt-requests to speak to someone about this decision and why he disagrees with it.  Pt also requests getting some sort of  "grievance" and  "fair hearing".  Will send request to attending and director for advise.[name], Tomorrow [name]6/9/20219:24 AM

## 2020-05-07 ENCOUNTER — Telehealth: Payer: Self-pay | Admitting: Hematology

## 2020-05-07 NOTE — Telephone Encounter (Signed)
Called pt per 7/13 sch msg - no answer. Left message for patient to call back to reschedule appt.

## 2020-05-14 ENCOUNTER — Other Ambulatory Visit: Payer: Self-pay | Admitting: Hematology

## 2020-05-14 DIAGNOSIS — C946 Myelodysplastic disease, not classified: Secondary | ICD-10-CM

## 2020-05-14 DIAGNOSIS — D47Z9 Other specified neoplasms of uncertain behavior of lymphoid, hematopoietic and related tissue: Secondary | ICD-10-CM

## 2020-05-17 ENCOUNTER — Other Ambulatory Visit: Payer: Self-pay

## 2020-05-17 ENCOUNTER — Inpatient Hospital Stay: Payer: BC Managed Care – PPO | Attending: Hematology | Admitting: Hematology

## 2020-05-17 VITALS — BP 160/82 | HR 91 | Temp 97.9°F | Resp 18 | Ht 73.0 in | Wt 204.7 lb

## 2020-05-17 DIAGNOSIS — I10 Essential (primary) hypertension: Secondary | ICD-10-CM | POA: Diagnosis not present

## 2020-05-17 DIAGNOSIS — D471 Chronic myeloproliferative disease: Secondary | ICD-10-CM | POA: Diagnosis not present

## 2020-05-17 DIAGNOSIS — Z1589 Genetic susceptibility to other disease: Secondary | ICD-10-CM | POA: Diagnosis not present

## 2020-05-17 DIAGNOSIS — R161 Splenomegaly, not elsewhere classified: Secondary | ICD-10-CM

## 2020-05-17 DIAGNOSIS — J45909 Unspecified asthma, uncomplicated: Secondary | ICD-10-CM | POA: Insufficient documentation

## 2020-05-17 DIAGNOSIS — Z79899 Other long term (current) drug therapy: Secondary | ICD-10-CM | POA: Diagnosis not present

## 2020-05-17 DIAGNOSIS — F17210 Nicotine dependence, cigarettes, uncomplicated: Secondary | ICD-10-CM | POA: Diagnosis not present
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
## 2020-05-19 ENCOUNTER — Telehealth: Payer: Self-pay | Admitting: Hematology

## 2020-05-19 NOTE — Telephone Encounter (Signed)
Scheduled per 07/26 los, patient has been called and notified.  

## 2020-05-21 ENCOUNTER — Inpatient Hospital Stay: Payer: BC Managed Care – PPO

## 2020-05-28 ENCOUNTER — Telehealth: Payer: Self-pay | Admitting: Hematology

## 2020-05-28 NOTE — Telephone Encounter (Signed)
Called pt per 8/6 sch msg - unable to reach pt- left message for patient to call back to reschedule missed appt.

## 2020-07-06 ENCOUNTER — Telehealth: Payer: Self-pay | Admitting: Hematology

## 2020-07-06 NOTE — Telephone Encounter (Signed)
Called pt per 9/13 sch msg - left message for patient - pt already has an appt on schedule - left message for patient about 10/27 and to call back if reschedule is needed

## 2020-08-18 ENCOUNTER — Inpatient Hospital Stay: Payer: BC Managed Care – PPO | Attending: Hematology

## 2020-08-18 ENCOUNTER — Telehealth: Payer: Self-pay | Admitting: Hematology

## 2020-08-18 ENCOUNTER — Inpatient Hospital Stay: Payer: BC Managed Care – PPO | Admitting: Hematology

## 2020-08-18 NOTE — Telephone Encounter (Signed)
Called pt per 10/26 sch msg - unable to reach pt. Left message for patient to call back to reschedule appt.

## 2020-09-10 ENCOUNTER — Inpatient Hospital Stay: Payer: BC Managed Care – PPO

## 2020-09-10 ENCOUNTER — Telehealth: Payer: Self-pay | Admitting: Hematology

## 2020-09-10 ENCOUNTER — Inpatient Hospital Stay: Payer: BC Managed Care – PPO | Admitting: Hematology

## 2020-09-10 NOTE — Telephone Encounter (Signed)
Per 11/19 switchboard message called to rescheduled 11/19 appointments - left message and cancelled appointments

## 2020-09-13 ENCOUNTER — Encounter: Payer: Self-pay | Admitting: *Deleted

## 2020-09-13 ENCOUNTER — Telehealth: Payer: Self-pay | Admitting: *Deleted

## 2020-09-13 NOTE — Telephone Encounter (Signed)
Letter signed by Dr. Irene Limbo sent to patient discharging from practice due to no shows/late cancellations. Letter in EMR

## 2020-10-18 IMAGING — US US ABDOMEN COMPLETE
1 series · 13 of 25 positions shown · non-contrast
Comparison: None.

CLINICAL DATA: Splenomegaly.  Myelofibrosis.

EXAM:
ABDOMEN ULTRASOUND COMPLETE

[Series 1: us abdomen complete · 0.19mm/px · 13 of 132 slices shown]
[im 1/132]
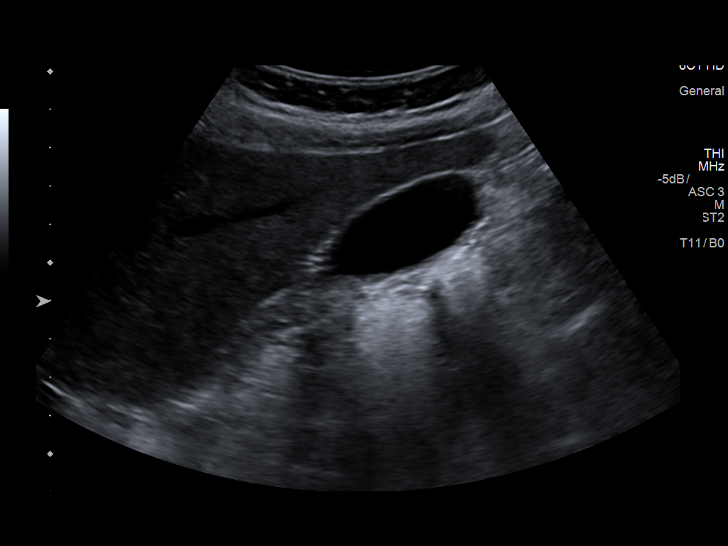
[im 11/132]
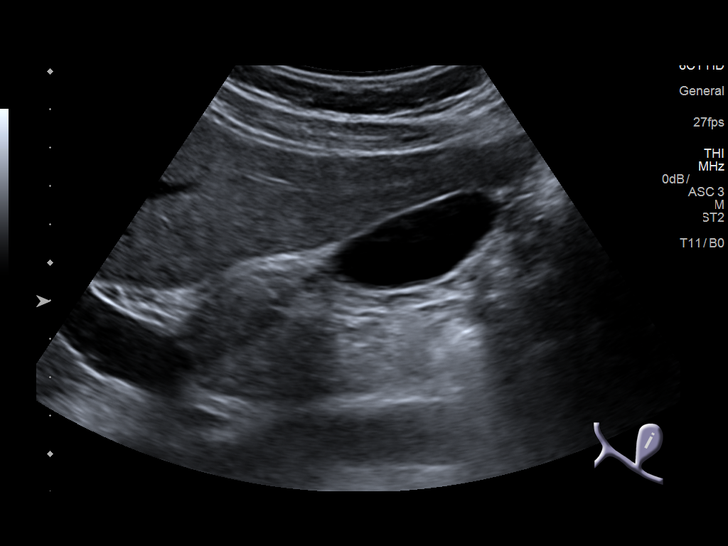
[im 22/132]
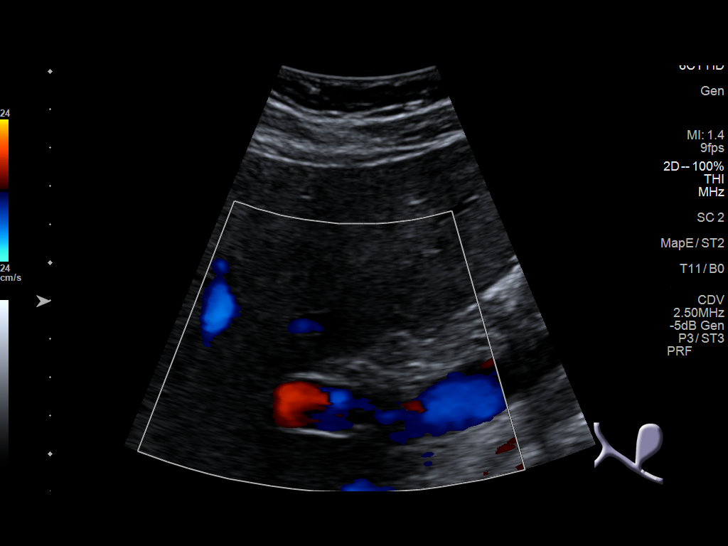
[im 33/132]
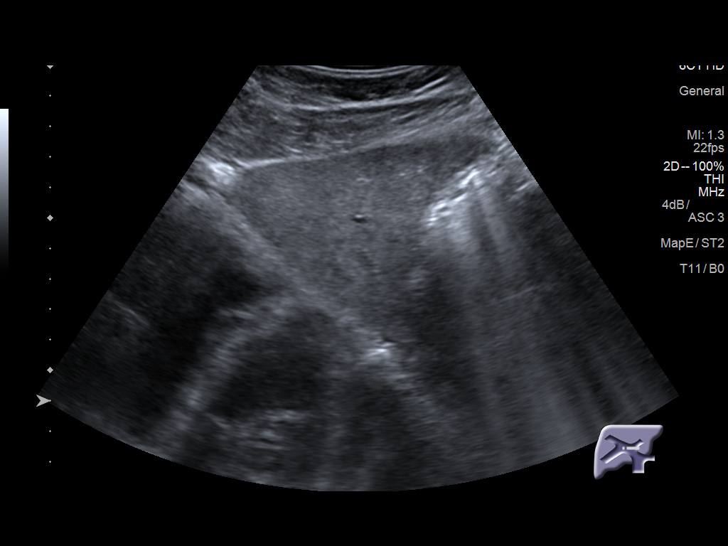
[im 44/132]
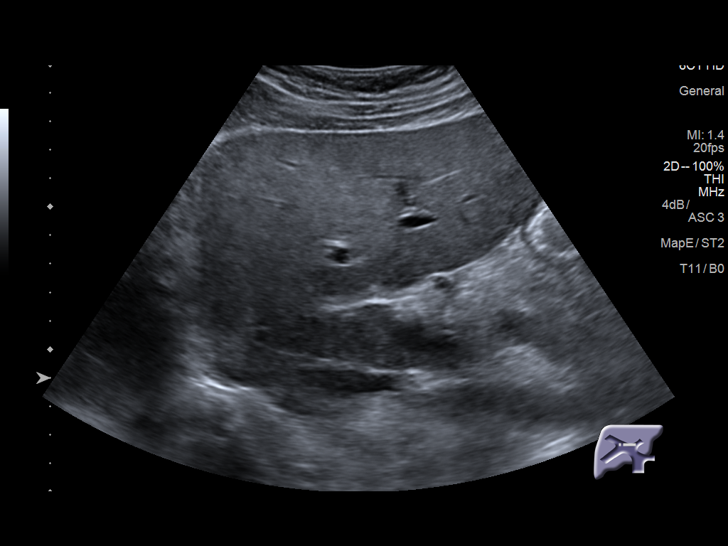
[im 55/132]
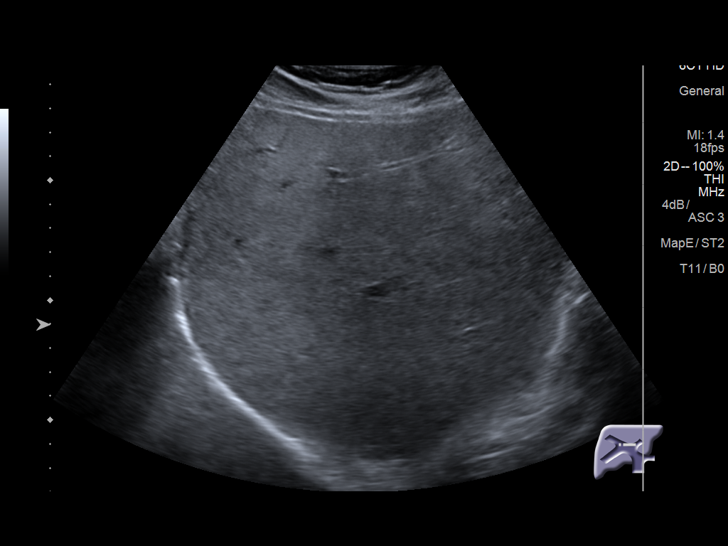
[im 66/132]
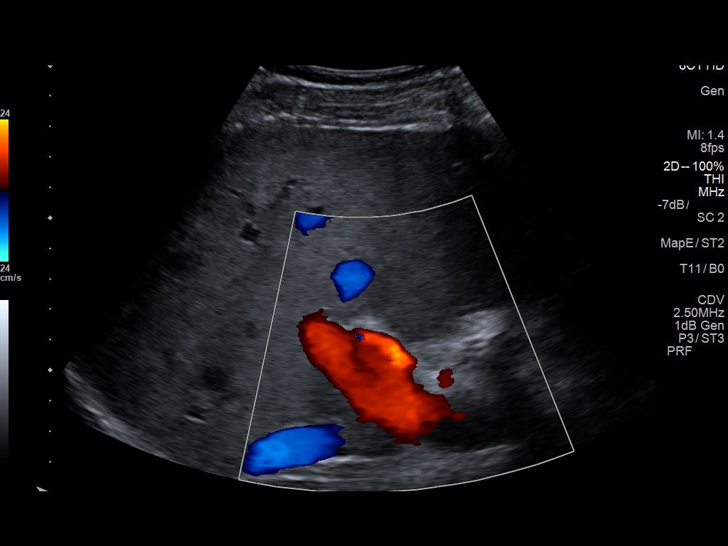
[im 77/132]
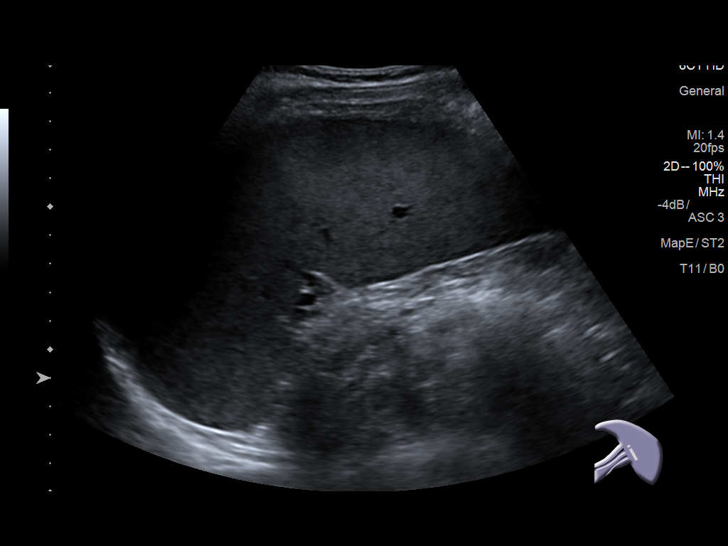
[im 88/132]
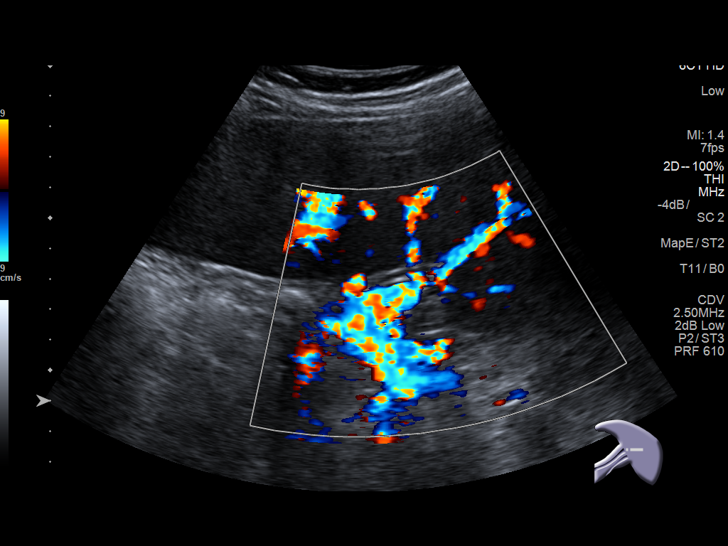
[im 99/132]
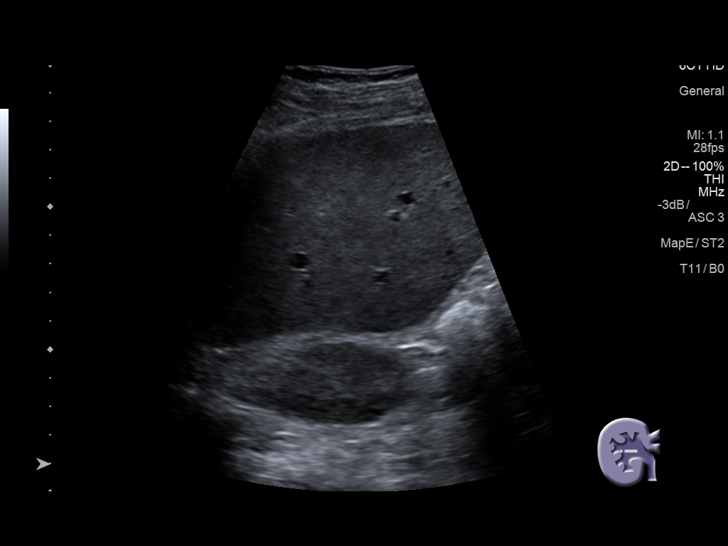
[im 110/132]
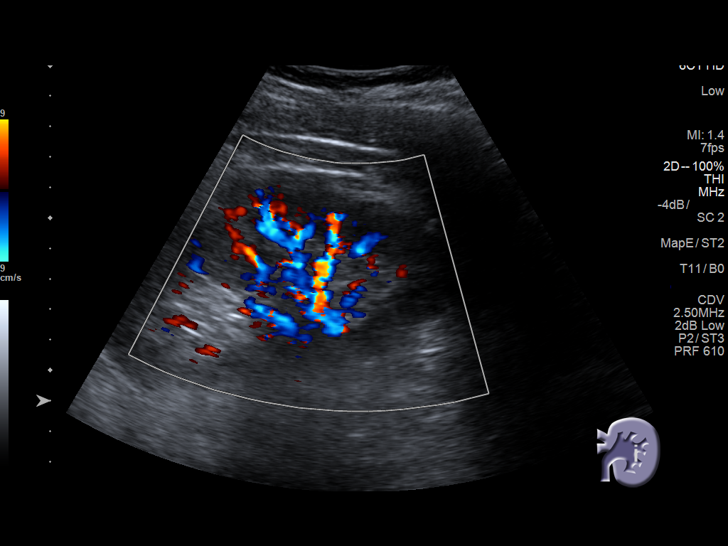
[im 121/132]
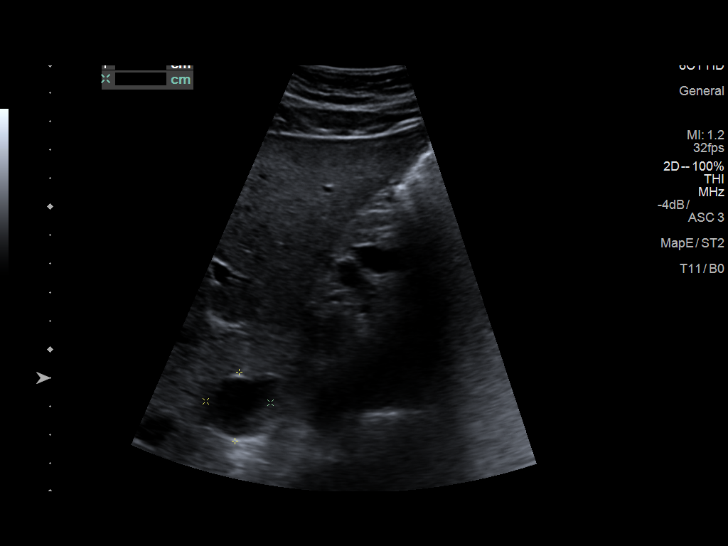
[im 132/132]
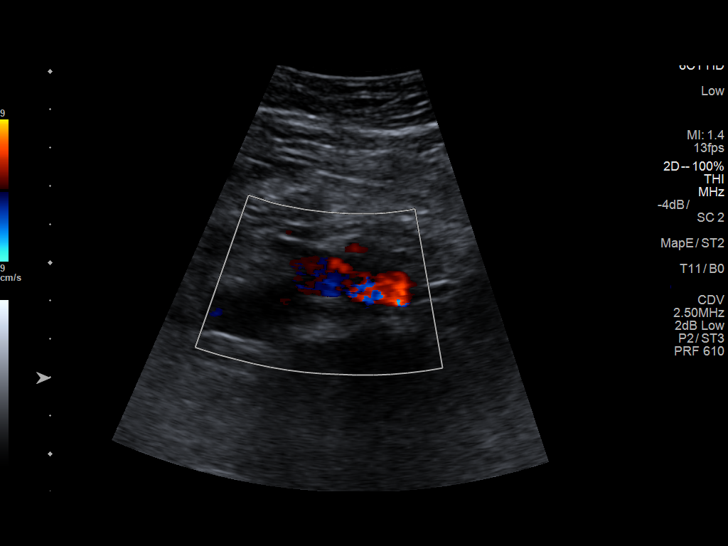

[13 of 25 positions shown; findings below may reference images not displayed]

FINDINGS: Gallbladder: No gallstones or wall thickening visualized. No
sonographic Murphy sign noted by sonographer.

Common bile duct: Diameter: 5 mm

Liver: No focal lesion identified. Within normal limits in
parenchymal echogenicity. Portal vein is patent on color Doppler
imaging with normal direction of blood flow towards the liver.

IVC: No abnormality visualized.

Pancreas: Visualized portion unremarkable.

Spleen: The spleen measures 21.3 cm in long axis and has a volume of
9481 cubic cm. The hypoechoic and slightly hypoenhancing lesion
along the inferior spleen shown on some prior exams is not well seen
today.

Right Kidney: Length: 10.5 cm. Echogenicity within normal limits. No
mass or hydronephrosis visualized.

Left Kidney: Length: 11.4 cm. Echogenicity within normal limits. No
mass or hydronephrosis visualized.

Abdominal aorta: No aneurysm visualized. Proximal iliac artery
atherosclerotic calcification.

Other findings: None.
IMPRESSION: 1. Considerable splenomegaly, splenic volume 9481 cubic cm.
2. Proximal iliac artery atherosclerotic calcification.

## 2020-10-21 ENCOUNTER — Other Ambulatory Visit: Payer: Self-pay | Admitting: Hematology

## 2020-10-21 DIAGNOSIS — C946 Myelodysplastic disease, not classified: Secondary | ICD-10-CM

## 2021-04-15 ENCOUNTER — Telehealth: Payer: Self-pay | Admitting: Hematology

## 2021-04-15 ENCOUNTER — Other Ambulatory Visit: Payer: Self-pay | Admitting: *Deleted

## 2021-04-15 DIAGNOSIS — C946 Myelodysplastic disease, not classified: Secondary | ICD-10-CM

## 2021-04-15 DIAGNOSIS — D47Z9 Other specified neoplasms of uncertain behavior of lymphoid, hematopoietic and related tissue: Secondary | ICD-10-CM

## 2021-04-15 MED ORDER — RUXOLITINIB PHOSPHATE 20 MG PO TABS: ORAL_TABLET | ORAL | 0 refills | Status: DC

## 2021-04-15 NOTE — Telephone Encounter (Signed)
Scheduled appt per 6/24 sch msg. Pt aware.  
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
## 2021-05-06 ENCOUNTER — Inpatient Hospital Stay: Payer: BC Managed Care – PPO | Attending: Hematology | Admitting: Hematology

## 2021-05-06 ENCOUNTER — Other Ambulatory Visit: Payer: Self-pay

## 2021-05-06 ENCOUNTER — Inpatient Hospital Stay: Payer: BC Managed Care – PPO

## 2021-05-06 VITALS — BP 145/62 | HR 72 | Temp 98.6°F | Resp 18 | Ht 73.0 in | Wt 203.9 lb

## 2021-05-06 DIAGNOSIS — D47Z9 Other specified neoplasms of uncertain behavior of lymphoid, hematopoietic and related tissue: Secondary | ICD-10-CM | POA: Diagnosis not present

## 2021-05-06 DIAGNOSIS — Z79899 Other long term (current) drug therapy: Secondary | ICD-10-CM | POA: Insufficient documentation

## 2021-05-06 DIAGNOSIS — I10 Essential (primary) hypertension: Secondary | ICD-10-CM | POA: Insufficient documentation

## 2021-05-06 DIAGNOSIS — D471 Chronic myeloproliferative disease: Secondary | ICD-10-CM | POA: Diagnosis not present

## 2021-05-06 DIAGNOSIS — Z1589 Genetic susceptibility to other disease: Secondary | ICD-10-CM

## 2021-05-06 DIAGNOSIS — C946 Myelodysplastic disease, not classified: Secondary | ICD-10-CM | POA: Insufficient documentation

## 2021-05-06 DIAGNOSIS — J45909 Unspecified asthma, uncomplicated: Secondary | ICD-10-CM | POA: Diagnosis not present

## 2021-05-06 DIAGNOSIS — R161 Splenomegaly, not elsewhere classified: Secondary | ICD-10-CM

## 2021-05-06 LAB — CBC WITH DIFFERENTIAL/PLATELET
Abs Immature Granulocytes: 0.43 10*3/uL — ABNORMAL HIGH (ref 0.00–0.07)
Basophils Absolute: 0.1 10*3/uL (ref 0.0–0.1)
Basophils Relative: 1 %
Eosinophils Absolute: 0 10*3/uL (ref 0.0–0.5)
Eosinophils Relative: 1 %
HCT: 37.4 % — ABNORMAL LOW (ref 39.0–52.0)
Hemoglobin: 11.7 g/dL — ABNORMAL LOW (ref 13.0–17.0)
Immature Granulocytes: 9 %
Lymphocytes Relative: 12 %
Lymphs Abs: 0.6 10*3/uL — ABNORMAL LOW (ref 0.7–4.0)
MCH: 25.8 pg — ABNORMAL LOW (ref 26.0–34.0)
MCHC: 31.3 g/dL (ref 30.0–36.0)
MCV: 82.6 fL (ref 80.0–100.0)
Monocytes Absolute: 0.2 10*3/uL (ref 0.1–1.0)
Monocytes Relative: 4 %
Neutro Abs: 3.7 10*3/uL (ref 1.7–7.7)
Neutrophils Relative %: 73 %
Platelets: 103 10*3/uL — ABNORMAL LOW (ref 150–400)
RBC: 4.53 MIL/uL (ref 4.22–5.81)
RDW: 16.9 % — ABNORMAL HIGH (ref 11.5–15.5)
WBC: 5 10*3/uL (ref 4.0–10.5)
nRBC: 1.6 % — ABNORMAL HIGH (ref 0.0–0.2)

## 2021-05-06 LAB — CMP (CANCER CENTER ONLY)
ALT: 13 U/L (ref 0–44)
AST: 17 U/L (ref 15–41)
Albumin: 3.4 g/dL — ABNORMAL LOW (ref 3.5–5.0)
Alkaline Phosphatase: 73 U/L (ref 38–126)
Anion gap: 6 (ref 5–15)
BUN: 7 mg/dL (ref 6–20)
CO2: 27 mmol/L (ref 22–32)
Calcium: 8.9 mg/dL (ref 8.9–10.3)
Chloride: 103 mmol/L (ref 98–111)
Creatinine: 1.06 mg/dL (ref 0.61–1.24)
GFR, Estimated: >60 mL/min (ref >60)
Glucose, Bld: 104 mg/dL — ABNORMAL HIGH (ref 70–99)
Potassium: 4 mmol/L (ref 3.5–5.1)
Sodium: 136 mmol/L (ref 135–145)
Total Bilirubin: 0.6 mg/dL (ref 0.3–1.2)
Total Protein: 7.5 g/dL (ref 6.5–8.1)

## 2021-05-06 LAB — LACTATE DEHYDROGENASE: LDH: 648 U/L — ABNORMAL HIGH (ref 98–192)

## 2021-05-06 MED ORDER — RUXOLITINIB PHOSPHATE 10 MG PO TABS: 10.0000 mg | ORAL_TABLET | Freq: Two times a day (BID) | ORAL | 2 refills | Status: DC

## 2021-05-09 ENCOUNTER — Telehealth: Payer: Self-pay | Admitting: Hematology

## 2021-05-09 NOTE — Telephone Encounter (Signed)
Left message with follow-up appointment per 7/15 los.

## 2021-07-01 ENCOUNTER — Inpatient Hospital Stay: Payer: BC Managed Care – PPO | Admitting: Hematology

## 2021-07-01 ENCOUNTER — Inpatient Hospital Stay: Payer: BC Managed Care – PPO

## 2021-07-07 ENCOUNTER — Inpatient Hospital Stay: Payer: BC Managed Care – PPO | Admitting: Hematology

## 2021-07-07 ENCOUNTER — Inpatient Hospital Stay: Payer: BC Managed Care – PPO

## 2021-07-15 ENCOUNTER — Inpatient Hospital Stay: Payer: BC Managed Care – PPO | Attending: Hematology

## 2021-07-15 ENCOUNTER — Inpatient Hospital Stay (HOSPITAL_BASED_OUTPATIENT_CLINIC_OR_DEPARTMENT_OTHER): Payer: BC Managed Care – PPO | Admitting: Hematology

## 2021-07-15 ENCOUNTER — Other Ambulatory Visit: Payer: Self-pay

## 2021-07-15 ENCOUNTER — Telehealth: Payer: Self-pay | Admitting: Hematology

## 2021-07-15 DIAGNOSIS — C946 Myelodysplastic disease, not classified: Secondary | ICD-10-CM | POA: Insufficient documentation

## 2021-07-15 DIAGNOSIS — I10 Essential (primary) hypertension: Secondary | ICD-10-CM | POA: Diagnosis not present

## 2021-07-15 DIAGNOSIS — R161 Splenomegaly, not elsewhere classified: Secondary | ICD-10-CM

## 2021-07-15 DIAGNOSIS — D47Z9 Other specified neoplasms of uncertain behavior of lymphoid, hematopoietic and related tissue: Secondary | ICD-10-CM

## 2021-07-15 DIAGNOSIS — D471 Chronic myeloproliferative disease: Secondary | ICD-10-CM

## 2021-07-15 DIAGNOSIS — Z79899 Other long term (current) drug therapy: Secondary | ICD-10-CM | POA: Diagnosis not present

## 2021-07-15 DIAGNOSIS — J45909 Unspecified asthma, uncomplicated: Secondary | ICD-10-CM | POA: Diagnosis not present

## 2021-07-15 LAB — CBC WITH DIFFERENTIAL (CANCER CENTER ONLY)
Abs Immature Granulocytes: 0.29 10*3/uL — ABNORMAL HIGH (ref 0.00–0.07)
Basophils Absolute: 0 10*3/uL (ref 0.0–0.1)
Basophils Relative: 1 %
Eosinophils Absolute: 0 10*3/uL (ref 0.0–0.5)
Eosinophils Relative: 0 %
HCT: 35.9 % — ABNORMAL LOW (ref 39.0–52.0)
Hemoglobin: 11.5 g/dL — ABNORMAL LOW (ref 13.0–17.0)
Immature Granulocytes: 7 %
Lymphocytes Relative: 12 %
Lymphs Abs: 0.5 10*3/uL — ABNORMAL LOW (ref 0.7–4.0)
MCH: 25.9 pg — ABNORMAL LOW (ref 26.0–34.0)
MCHC: 32 g/dL (ref 30.0–36.0)
MCV: 80.9 fL (ref 80.0–100.0)
Monocytes Absolute: 0.2 10*3/uL (ref 0.1–1.0)
Monocytes Relative: 4 %
Neutro Abs: 3.3 10*3/uL (ref 1.7–7.7)
Neutrophils Relative %: 76 %
Platelet Count: 101 10*3/uL — ABNORMAL LOW (ref 150–400)
RBC: 4.44 MIL/uL (ref 4.22–5.81)
RDW: 16.7 % — ABNORMAL HIGH (ref 11.5–15.5)
WBC Count: 4.3 10*3/uL (ref 4.0–10.5)
nRBC: 1.2 % — ABNORMAL HIGH (ref 0.0–0.2)

## 2021-07-15 LAB — CMP (CANCER CENTER ONLY)
ALT: 22 U/L (ref 0–44)
AST: 21 U/L (ref 15–41)
Albumin: 3.9 g/dL (ref 3.5–5.0)
Alkaline Phosphatase: 68 U/L (ref 38–126)
Anion gap: 8 (ref 5–15)
BUN: 11 mg/dL (ref 6–20)
CO2: 25 mmol/L (ref 22–32)
Calcium: 8.8 mg/dL — ABNORMAL LOW (ref 8.9–10.3)
Chloride: 103 mmol/L (ref 98–111)
Creatinine: 1.21 mg/dL (ref 0.61–1.24)
GFR, Estimated: >60 mL/min (ref >60)
Glucose, Bld: 107 mg/dL — ABNORMAL HIGH (ref 70–99)
Potassium: 3.8 mmol/L (ref 3.5–5.1)
Sodium: 136 mmol/L (ref 135–145)
Total Bilirubin: 0.6 mg/dL (ref 0.3–1.2)
Total Protein: 7.8 g/dL (ref 6.5–8.1)

## 2021-07-15 LAB — RETICULOCYTES
Immature Retic Fract: 21.1 % — ABNORMAL HIGH (ref 2.3–15.9)
RBC.: 4.45 MIL/uL (ref 4.22–5.81)
Retic Count, Absolute: 81 10*3/uL (ref 19.0–186.0)
Retic Ct Pct: 1.8 % (ref 0.4–3.1)

## 2021-07-15 LAB — LACTATE DEHYDROGENASE: LDH: 608 U/L — ABNORMAL HIGH (ref 98–192)

## 2021-07-15 MED ORDER — RUXOLITINIB PHOSPHATE 10 MG PO TABS: 10.0000 mg | ORAL_TABLET | Freq: Two times a day (BID) | ORAL | 2 refills | Status: DC

## 2021-07-15 NOTE — Telephone Encounter (Signed)
Scheduled appt per 9/23 los  - mailed letter with appt date and time - central radiology to contact patient with ct/ pet scan
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
## 2021-08-05 ENCOUNTER — Other Ambulatory Visit: Payer: Self-pay

## 2021-08-05 ENCOUNTER — Encounter: Payer: Self-pay | Admitting: Emergency Medicine

## 2021-08-05 ENCOUNTER — Ambulatory Visit
Admission: EM | Admit: 2021-08-05 | Discharge: 2021-08-05 | Disposition: A | Discharge status: Home / Self Care | Payer: BC Managed Care – PPO | Attending: Physician Assistant | Admitting: Physician Assistant

## 2021-08-05 DIAGNOSIS — Z113 Encounter for screening for infections with a predominantly sexual mode of transmission: Secondary | ICD-10-CM | POA: Diagnosis not present

## 2021-08-05 DIAGNOSIS — M545 Low back pain, unspecified: Secondary | ICD-10-CM

## 2021-08-05 MED ORDER — PREDNISONE 20 MG PO TABS: 40.0000 mg | ORAL_TABLET | Freq: Every day | ORAL | 0 refills | Status: AC

## 2021-08-05 NOTE — Discharge Instructions (Addendum)
Follow up if symptoms fail to improve or worsen.

## 2021-08-05 NOTE — ED Triage Notes (Addendum)
Back pain radiating down leg starting Monday, no hx of same. Difficulty getting dressed and walking. Denies injuring it that he's aware of. Works at [employer] and does a lot of lifting. Has not tried anything at home to help. Takes percocet 10 mg he's prescribed due to splenic pain.   States he was seen across the road two days ago and prescribed meloxicam, but has not picked it up because he wanted a second opinion
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
## 2021-08-30 ENCOUNTER — Other Ambulatory Visit: Payer: Self-pay | Admitting: Endocrinology

## 2021-08-30 DIAGNOSIS — M545 Low back pain, unspecified: Secondary | ICD-10-CM

## 2021-09-25 ENCOUNTER — Ambulatory Visit
Admission: RE | Admit: 2021-09-25 | Discharge: 2021-09-25 | Disposition: A | Discharge status: Home / Self Care | Payer: BC Managed Care – PPO | Source: Ambulatory Visit | Attending: Endocrinology | Admitting: Endocrinology

## 2021-09-25 ENCOUNTER — Other Ambulatory Visit: Payer: Self-pay

## 2021-09-25 DIAGNOSIS — M545 Low back pain, unspecified: Secondary | ICD-10-CM

## 2021-09-25 MED ORDER — GADOBENATE DIMEGLUMINE 529 MG/ML IV SOLN
19.0000 mL | Freq: Once | INTRAVENOUS | Status: AC | PRN
  Administered 2021-09-25: 19 mL via INTRAVENOUS

## 2021-09-27 ENCOUNTER — Ambulatory Visit (HOSPITAL_COMMUNITY): Payer: BC Managed Care – PPO

## 2021-09-30 ENCOUNTER — Other Ambulatory Visit: Payer: Self-pay

## 2021-09-30 ENCOUNTER — Ambulatory Visit (HOSPITAL_COMMUNITY)
Admission: RE | Admit: 2021-09-30 | Discharge: 2021-09-30 | Disposition: A | Discharge status: Home / Self Care | Payer: BC Managed Care – PPO | Source: Ambulatory Visit | Attending: Hematology | Admitting: Hematology

## 2021-09-30 DIAGNOSIS — R161 Splenomegaly, not elsewhere classified: Secondary | ICD-10-CM | POA: Insufficient documentation

## 2021-09-30 DIAGNOSIS — D471 Chronic myeloproliferative disease: Secondary | ICD-10-CM | POA: Diagnosis not present

## 2021-10-07 ENCOUNTER — Inpatient Hospital Stay: Payer: BC Managed Care – PPO | Admitting: Hematology

## 2021-10-07 ENCOUNTER — Inpatient Hospital Stay: Payer: BC Managed Care – PPO

## 2021-10-19 ENCOUNTER — Other Ambulatory Visit: Payer: Self-pay | Admitting: *Deleted

## 2021-10-19 DIAGNOSIS — C946 Myelodysplastic disease, not classified: Secondary | ICD-10-CM

## 2021-10-20 ENCOUNTER — Inpatient Hospital Stay (HOSPITAL_BASED_OUTPATIENT_CLINIC_OR_DEPARTMENT_OTHER): Payer: BC Managed Care – PPO | Admitting: Hematology

## 2021-10-20 ENCOUNTER — Inpatient Hospital Stay: Payer: BC Managed Care – PPO | Attending: Hematology

## 2021-10-20 ENCOUNTER — Other Ambulatory Visit: Payer: Self-pay

## 2021-10-20 ENCOUNTER — Other Ambulatory Visit (HOSPITAL_COMMUNITY): Payer: Self-pay

## 2021-10-20 DIAGNOSIS — C946 Myelodysplastic disease, not classified: Secondary | ICD-10-CM | POA: Diagnosis not present

## 2021-10-20 DIAGNOSIS — R161 Splenomegaly, not elsewhere classified: Secondary | ICD-10-CM | POA: Diagnosis not present

## 2021-10-20 DIAGNOSIS — D7581 Myelofibrosis: Secondary | ICD-10-CM | POA: Diagnosis present

## 2021-10-20 DIAGNOSIS — Z79899 Other long term (current) drug therapy: Secondary | ICD-10-CM | POA: Insufficient documentation

## 2021-10-20 LAB — CBC WITH DIFFERENTIAL (CANCER CENTER ONLY)
Abs Immature Granulocytes: 0.35 10*3/uL — ABNORMAL HIGH (ref 0.00–0.07)
Basophils Absolute: 0 10*3/uL (ref 0.0–0.1)
Basophils Relative: 0 %
Eosinophils Absolute: 0 10*3/uL (ref 0.0–0.5)
Eosinophils Relative: 1 %
HCT: 39.4 % (ref 39.0–52.0)
Hemoglobin: 12.4 g/dL — ABNORMAL LOW (ref 13.0–17.0)
Immature Granulocytes: 6 %
Lymphocytes Relative: 9 %
Lymphs Abs: 0.6 10*3/uL — ABNORMAL LOW (ref 0.7–4.0)
MCH: 25.3 pg — ABNORMAL LOW (ref 26.0–34.0)
MCHC: 31.5 g/dL (ref 30.0–36.0)
MCV: 80.2 fL (ref 80.0–100.0)
Monocytes Absolute: 0.2 10*3/uL (ref 0.1–1.0)
Monocytes Relative: 3 %
Neutro Abs: 5.2 10*3/uL (ref 1.7–7.7)
Neutrophils Relative %: 81 %
Platelet Count: 97 10*3/uL — ABNORMAL LOW (ref 150–400)
RBC: 4.91 MIL/uL (ref 4.22–5.81)
RDW: 15.9 % — ABNORMAL HIGH (ref 11.5–15.5)
Smear Review: NORMAL
WBC Count: 6.4 10*3/uL (ref 4.0–10.5)
nRBC: 0.9 % — ABNORMAL HIGH (ref 0.0–0.2)

## 2021-10-20 LAB — CMP (CANCER CENTER ONLY)
ALT: 22 U/L (ref 0–44)
AST: 20 U/L (ref 15–41)
Albumin: 4.1 g/dL (ref 3.5–5.0)
Alkaline Phosphatase: 69 U/L (ref 38–126)
Anion gap: 7 (ref 5–15)
BUN: 7 mg/dL (ref 6–20)
CO2: 29 mmol/L (ref 22–32)
Calcium: 8.7 mg/dL — ABNORMAL LOW (ref 8.9–10.3)
Chloride: 99 mmol/L (ref 98–111)
Creatinine: 1.07 mg/dL (ref 0.61–1.24)
GFR, Estimated: >60 mL/min (ref >60)
Glucose, Bld: 108 mg/dL — ABNORMAL HIGH (ref 70–99)
Potassium: 3.8 mmol/L (ref 3.5–5.1)
Sodium: 135 mmol/L (ref 135–145)
Total Bilirubin: 0.6 mg/dL (ref 0.3–1.2)
Total Protein: 7.8 g/dL (ref 6.5–8.1)

## 2021-10-20 LAB — RETICULOCYTES
Immature Retic Fract: 18.3 % — ABNORMAL HIGH (ref 2.3–15.9)
RBC.: 4.8 MIL/uL (ref 4.22–5.81)
Retic Count, Absolute: 63.4 10*3/uL (ref 19.0–186.0)
Retic Ct Pct: 1.3 % (ref 0.4–3.1)

## 2021-10-20 LAB — LACTATE DEHYDROGENASE: LDH: 628 U/L — ABNORMAL HIGH (ref 98–192)

## 2021-10-20 MED ORDER — RUXOLITINIB PHOSPHATE 15 MG PO TABS: 15.0000 mg | ORAL_TABLET | Freq: Two times a day (BID) | ORAL | 3 refills | Status: DC

## 2021-10-25 ENCOUNTER — Telehealth: Payer: Self-pay | Admitting: Hematology

## 2021-10-25 NOTE — Telephone Encounter (Signed)
Left message with follow-up appointments per 12/29 los.
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
## 2021-11-28 ENCOUNTER — Other Ambulatory Visit: Payer: Self-pay

## 2021-11-28 ENCOUNTER — Telehealth: Payer: Self-pay | Admitting: Hematology

## 2021-11-28 DIAGNOSIS — C946 Myelodysplastic disease, not classified: Secondary | ICD-10-CM
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
## 2021-11-29 ENCOUNTER — Inpatient Hospital Stay: Payer: BC Managed Care – PPO

## 2021-11-30 ENCOUNTER — Inpatient Hospital Stay: Payer: BC Managed Care – PPO | Admitting: Hematology

## 2021-12-16 ENCOUNTER — Other Ambulatory Visit: Payer: BC Managed Care – PPO

## 2021-12-22 ENCOUNTER — Other Ambulatory Visit: Payer: BC Managed Care – PPO

## 2021-12-23 ENCOUNTER — Ambulatory Visit: Payer: BC Managed Care – PPO | Admitting: Hematology

## 2022-01-10 ENCOUNTER — Inpatient Hospital Stay: Payer: BC Managed Care – PPO | Attending: Hematology

## 2022-01-10 ENCOUNTER — Other Ambulatory Visit: Payer: Self-pay

## 2022-01-10 DIAGNOSIS — C946 Myelodysplastic disease, not classified: Secondary | ICD-10-CM

## 2022-01-10 DIAGNOSIS — D7581 Myelofibrosis: Secondary | ICD-10-CM | POA: Diagnosis not present

## 2022-01-10 DIAGNOSIS — R7402 Elevation of levels of lactic acid dehydrogenase (LDH): Secondary | ICD-10-CM | POA: Insufficient documentation

## 2022-01-10 LAB — CBC WITH DIFFERENTIAL (CANCER CENTER ONLY)
Abs Immature Granulocytes: 0.38 10*3/uL — ABNORMAL HIGH (ref 0.00–0.07)
Basophils Absolute: 0 10*3/uL (ref 0.0–0.1)
Basophils Relative: 1 %
Eosinophils Absolute: 0 10*3/uL (ref 0.0–0.5)
Eosinophils Relative: 1 %
HCT: 40.5 % (ref 39.0–52.0)
Hemoglobin: 12.7 g/dL — ABNORMAL LOW (ref 13.0–17.0)
Immature Granulocytes: 8 %
Lymphocytes Relative: 15 %
Lymphs Abs: 0.7 10*3/uL (ref 0.7–4.0)
MCH: 25.9 pg — ABNORMAL LOW (ref 26.0–34.0)
MCHC: 31.4 g/dL (ref 30.0–36.0)
MCV: 82.7 fL (ref 80.0–100.0)
Monocytes Absolute: 0.2 10*3/uL (ref 0.1–1.0)
Monocytes Relative: 4 %
Neutro Abs: 3.7 10*3/uL (ref 1.7–7.7)
Neutrophils Relative %: 71 %
Platelet Count: 113 10*3/uL — ABNORMAL LOW (ref 150–400)
RBC: 4.9 MIL/uL (ref 4.22–5.81)
RDW: 17 % — ABNORMAL HIGH (ref 11.5–15.5)
WBC Count: 5.1 10*3/uL (ref 4.0–10.5)
nRBC: 1 % — ABNORMAL HIGH (ref 0.0–0.2)

## 2022-01-10 LAB — CMP (CANCER CENTER ONLY)
ALT: 24 U/L (ref 0–44)
AST: 21 U/L (ref 15–41)
Albumin: 3.9 g/dL (ref 3.5–5.0)
Alkaline Phosphatase: 70 U/L (ref 38–126)
Anion gap: 7 (ref 5–15)
BUN: 9 mg/dL (ref 6–20)
CO2: 28 mmol/L (ref 22–32)
Calcium: 8.9 mg/dL (ref 8.9–10.3)
Chloride: 102 mmol/L (ref 98–111)
Creatinine: 1.04 mg/dL (ref 0.61–1.24)
GFR, Estimated: >60 mL/min (ref >60)
Glucose, Bld: 127 mg/dL — ABNORMAL HIGH (ref 70–99)
Potassium: 3.6 mmol/L (ref 3.5–5.1)
Sodium: 137 mmol/L (ref 135–145)
Total Bilirubin: 0.6 mg/dL (ref 0.3–1.2)
Total Protein: 7.5 g/dL (ref 6.5–8.1)

## 2022-01-10 LAB — RETICULOCYTES
Immature Retic Fract: 22.7 % — ABNORMAL HIGH (ref 2.3–15.9)
RBC.: 4.8 MIL/uL (ref 4.22–5.81)
Retic Count, Absolute: 108.5 10*3/uL (ref 19.0–186.0)
Retic Ct Pct: 2.3 % (ref 0.4–3.1)

## 2022-01-10 LAB — LACTATE DEHYDROGENASE: LDH: 517 U/L — ABNORMAL HIGH (ref 98–192)

## 2022-01-11 ENCOUNTER — Inpatient Hospital Stay (HOSPITAL_BASED_OUTPATIENT_CLINIC_OR_DEPARTMENT_OTHER): Payer: BC Managed Care – PPO | Admitting: Hematology

## 2022-01-11 ENCOUNTER — Telehealth: Payer: BC Managed Care – PPO | Admitting: Hematology

## 2022-01-11 DIAGNOSIS — C946 Myelodysplastic disease, not classified: Secondary | ICD-10-CM

## 2022-01-11 DIAGNOSIS — D7581 Myelofibrosis: Secondary | ICD-10-CM | POA: Diagnosis not present

## 2022-01-11 MED ORDER — RUXOLITINIB PHOSPHATE 15 MG PO TABS: 15.0000 mg | ORAL_TABLET | Freq: Two times a day (BID) | ORAL | 3 refills | Status: DC

## 2022-01-12 ENCOUNTER — Telehealth: Payer: Self-pay | Admitting: Hematology

## 2022-01-12 NOTE — Telephone Encounter (Signed)
Left message with follow-up appointment per 3/22 los. ?
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
## 2022-03-30 ENCOUNTER — Telehealth: Payer: Self-pay | Admitting: Hematology

## 2022-03-30 NOTE — Telephone Encounter (Signed)
Left message with rescheduled upcoming appointment due to provider's schedule. 

## 2022-04-13 ENCOUNTER — Ambulatory Visit: Payer: BC Managed Care – PPO | Admitting: Hematology

## 2022-04-13 ENCOUNTER — Other Ambulatory Visit: Payer: BC Managed Care – PPO

## 2022-04-17 ENCOUNTER — Other Ambulatory Visit: Payer: Self-pay

## 2022-04-17 DIAGNOSIS — C946 Myelodysplastic disease, not classified: Secondary | ICD-10-CM

## 2022-04-18 ENCOUNTER — Inpatient Hospital Stay: Payer: BC Managed Care – PPO | Admitting: Hematology

## 2022-04-18 ENCOUNTER — Inpatient Hospital Stay: Payer: BC Managed Care – PPO | Attending: Hematology

## 2022-04-20 ENCOUNTER — Telehealth: Payer: Self-pay | Admitting: Hematology

## 2022-04-20 NOTE — Telephone Encounter (Signed)
.  Called patient to schedule appointment per 6/28 inbasket, left pt msg

## 2022-06-01 ENCOUNTER — Telehealth: Payer: Self-pay | Admitting: Hematology

## 2022-06-01 NOTE — Telephone Encounter (Signed)
Rescheduled upcoming appointment per patient's request. Patient is aware of changes. 

## 2022-06-02 ENCOUNTER — Inpatient Hospital Stay: Payer: BC Managed Care – PPO

## 2022-06-02 ENCOUNTER — Inpatient Hospital Stay: Payer: BC Managed Care – PPO | Admitting: Hematology

## 2022-07-14 NOTE — Progress Notes (Signed)
Pt called requesting refill of Jakafi. Per Dr Irene Limbo pt needs to come in and be seen prior to him sending in another refill. Pt has had multiple missed or canceled appointments. Pt verbalized understanding. Pt has scheduled appointment on Monday 07/17/22.

## 2022-07-17 ENCOUNTER — Inpatient Hospital Stay (HOSPITAL_BASED_OUTPATIENT_CLINIC_OR_DEPARTMENT_OTHER): Payer: BC Managed Care – PPO | Admitting: Hematology

## 2022-07-17 ENCOUNTER — Other Ambulatory Visit: Payer: Self-pay

## 2022-07-17 ENCOUNTER — Inpatient Hospital Stay: Payer: BC Managed Care – PPO | Attending: Hematology

## 2022-07-17 DIAGNOSIS — D471 Chronic myeloproliferative disease: Secondary | ICD-10-CM | POA: Insufficient documentation

## 2022-07-17 DIAGNOSIS — F17210 Nicotine dependence, cigarettes, uncomplicated: Secondary | ICD-10-CM | POA: Diagnosis not present

## 2022-07-17 DIAGNOSIS — C946 Myelodysplastic disease, not classified: Secondary | ICD-10-CM

## 2022-07-17 DIAGNOSIS — Z79899 Other long term (current) drug therapy: Secondary | ICD-10-CM | POA: Diagnosis not present

## 2022-07-17 LAB — LACTATE DEHYDROGENASE: LDH: 498 U/L — ABNORMAL HIGH (ref 98–192)

## 2022-07-17 LAB — CBC WITH DIFFERENTIAL (CANCER CENTER ONLY)
Abs Immature Granulocytes: 0.22 10*3/uL — ABNORMAL HIGH (ref 0.00–0.07)
Basophils Absolute: 0 10*3/uL (ref 0.0–0.1)
Basophils Relative: 1 %
Eosinophils Absolute: 0 10*3/uL (ref 0.0–0.5)
Eosinophils Relative: 1 %
HCT: 37.3 % — ABNORMAL LOW (ref 39.0–52.0)
Hemoglobin: 11.9 g/dL — ABNORMAL LOW (ref 13.0–17.0)
Immature Granulocytes: 5 %
Lymphocytes Relative: 14 %
Lymphs Abs: 0.6 10*3/uL — ABNORMAL LOW (ref 0.7–4.0)
MCH: 26.1 pg (ref 26.0–34.0)
MCHC: 31.9 g/dL (ref 30.0–36.0)
MCV: 81.8 fL (ref 80.0–100.0)
Monocytes Absolute: 0.2 10*3/uL (ref 0.1–1.0)
Monocytes Relative: 4 %
Neutro Abs: 3.2 10*3/uL (ref 1.7–7.7)
Neutrophils Relative %: 75 %
Platelet Count: 72 10*3/uL — ABNORMAL LOW (ref 150–400)
RBC: 4.56 MIL/uL (ref 4.22–5.81)
RDW: 16.3 % — ABNORMAL HIGH (ref 11.5–15.5)
Smear Review: NORMAL
WBC Count: 4.3 10*3/uL (ref 4.0–10.5)
nRBC: 1.2 % — ABNORMAL HIGH (ref 0.0–0.2)

## 2022-07-17 LAB — CMP (CANCER CENTER ONLY)
ALT: 22 U/L (ref 0–44)
AST: 19 U/L (ref 15–41)
Albumin: 4 g/dL (ref 3.5–5.0)
Alkaline Phosphatase: 62 U/L (ref 38–126)
Anion gap: 5 (ref 5–15)
BUN: 8 mg/dL (ref 6–20)
CO2: 28 mmol/L (ref 22–32)
Calcium: 8.8 mg/dL — ABNORMAL LOW (ref 8.9–10.3)
Chloride: 100 mmol/L (ref 98–111)
Creatinine: 1.02 mg/dL (ref 0.61–1.24)
GFR, Estimated: >60 mL/min (ref >60)
Glucose, Bld: 125 mg/dL — ABNORMAL HIGH (ref 70–99)
Potassium: 3.8 mmol/L (ref 3.5–5.1)
Sodium: 133 mmol/L — ABNORMAL LOW (ref 135–145)
Total Bilirubin: 0.5 mg/dL (ref 0.3–1.2)
Total Protein: 7.8 g/dL (ref 6.5–8.1)

## 2022-07-17 LAB — RETICULOCYTES
Immature Retic Fract: 23.1 % — ABNORMAL HIGH (ref 2.3–15.9)
RBC.: 4.57 MIL/uL (ref 4.22–5.81)
Retic Count, Absolute: 88.7 10*3/uL (ref 19.0–186.0)
Retic Ct Pct: 1.9 % (ref 0.4–3.1)

## 2022-07-17 MED ORDER — RUXOLITINIB PHOSPHATE 10 MG PO TABS: 10.0000 mg | ORAL_TABLET | Freq: Two times a day (BID) | ORAL | 2 refills | Status: DC
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
## 2022-07-24 ENCOUNTER — Other Ambulatory Visit (HOSPITAL_COMMUNITY): Payer: Self-pay

## 2022-07-24 ENCOUNTER — Telehealth: Payer: Self-pay | Admitting: Pharmacy Technician

## 2022-07-24 NOTE — Telephone Encounter (Signed)
Oral Oncology Patient Advocate Encounter   Received notification that prior authorization for Sean Walter is due for renewal.   PA submitted on 07/24/2022 Key [id number] Status is pending     [name], CPhT-Adv Oncology Pharmacy Patient [place] Direct Number: [phone]  Fax: [phone]

## 2022-07-25 NOTE — Telephone Encounter (Signed)
Oral Oncology Patient Advocate Encounter  Prior Authorization for Sean Walter has been approved.    PA#  [id number] Effective dates: 07/24/2022 through 07/25/2023  Patient may continue to fill at [facility].    [name], CPhT-Adv Oncology Pharmacy Patient [name] Direct Number: [phone]  Fax: [phone]

## 2022-11-24 ENCOUNTER — Other Ambulatory Visit: Payer: Self-pay

## 2022-11-24 DIAGNOSIS — C946 Myelodysplastic disease, not classified: Secondary | ICD-10-CM

## 2022-11-27 ENCOUNTER — Inpatient Hospital Stay: Payer: BC Managed Care – PPO

## 2022-11-27 ENCOUNTER — Inpatient Hospital Stay: Payer: BC Managed Care – PPO | Admitting: Hematology

## 2022-11-27 ENCOUNTER — Other Ambulatory Visit: Payer: Self-pay | Admitting: Hematology

## 2022-11-27 DIAGNOSIS — C946 Myelodysplastic disease, not classified: Secondary | ICD-10-CM

## 2022-12-22 ENCOUNTER — Inpatient Hospital Stay: Payer: BC Managed Care – PPO | Admitting: Hematology

## 2022-12-22 ENCOUNTER — Inpatient Hospital Stay: Payer: BC Managed Care – PPO
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
## 2023-02-09 ENCOUNTER — Inpatient Hospital Stay: Payer: BC Managed Care – PPO | Admitting: Hematology

## 2023-02-09 ENCOUNTER — Other Ambulatory Visit: Payer: Self-pay

## 2023-02-09 ENCOUNTER — Inpatient Hospital Stay (HOSPITAL_BASED_OUTPATIENT_CLINIC_OR_DEPARTMENT_OTHER): Payer: BC Managed Care – PPO | Admitting: Hematology

## 2023-02-09 ENCOUNTER — Inpatient Hospital Stay: Payer: BC Managed Care – PPO

## 2023-02-09 ENCOUNTER — Inpatient Hospital Stay: Payer: BC Managed Care – PPO | Attending: Hematology

## 2023-02-09 DIAGNOSIS — R21 Rash and other nonspecific skin eruption: Secondary | ICD-10-CM | POA: Insufficient documentation

## 2023-02-09 DIAGNOSIS — C946 Myelodysplastic disease, not classified: Secondary | ICD-10-CM

## 2023-02-09 DIAGNOSIS — F17210 Nicotine dependence, cigarettes, uncomplicated: Secondary | ICD-10-CM | POA: Insufficient documentation

## 2023-02-09 DIAGNOSIS — D471 Chronic myeloproliferative disease: Secondary | ICD-10-CM | POA: Diagnosis present

## 2023-02-09 LAB — CBC WITH DIFFERENTIAL (CANCER CENTER ONLY)
Abs Immature Granulocytes: 0.53 10*3/uL — ABNORMAL HIGH (ref 0.00–0.07)
Basophils Absolute: 0.1 10*3/uL (ref 0.0–0.1)
Basophils Relative: 1 %
Eosinophils Absolute: 0 10*3/uL (ref 0.0–0.5)
Eosinophils Relative: 0 %
HCT: 40.3 % (ref 39.0–52.0)
Hemoglobin: 12.7 g/dL — ABNORMAL LOW (ref 13.0–17.0)
Immature Granulocytes: 8 %
Lymphocytes Relative: 8 %
Lymphs Abs: 0.5 10*3/uL — ABNORMAL LOW (ref 0.7–4.0)
MCH: 25.9 pg — ABNORMAL LOW (ref 26.0–34.0)
MCHC: 31.5 g/dL (ref 30.0–36.0)
MCV: 82.1 fL (ref 80.0–100.0)
Monocytes Absolute: 0.2 10*3/uL (ref 0.1–1.0)
Monocytes Relative: 3 %
Neutro Abs: 5.3 10*3/uL (ref 1.7–7.7)
Neutrophils Relative %: 80 %
Platelet Count: 87 10*3/uL — ABNORMAL LOW (ref 150–400)
RBC: 4.91 MIL/uL (ref 4.22–5.81)
RDW: 17.4 % — ABNORMAL HIGH (ref 11.5–15.5)
WBC Count: 6.7 10*3/uL (ref 4.0–10.5)
nRBC: 1.2 % — ABNORMAL HIGH (ref 0.0–0.2)

## 2023-02-09 LAB — RETICULOCYTES
Immature Retic Fract: 20.4 % — ABNORMAL HIGH (ref 2.3–15.9)
RBC.: 4.88 MIL/uL (ref 4.22–5.81)
Retic Count, Absolute: 155.7 10*3/uL (ref 19.0–186.0)
Retic Ct Pct: 3.2 % — ABNORMAL HIGH (ref 0.4–3.1)

## 2023-02-09 LAB — CMP (CANCER CENTER ONLY)
ALT: 20 U/L (ref 0–44)
AST: 19 U/L (ref 15–41)
Albumin: 4 g/dL (ref 3.5–5.0)
Alkaline Phosphatase: 80 U/L (ref 38–126)
Anion gap: 6 (ref 5–15)
BUN: 7 mg/dL (ref 6–20)
CO2: 28 mmol/L (ref 22–32)
Calcium: 9.3 mg/dL (ref 8.9–10.3)
Chloride: 101 mmol/L (ref 98–111)
Creatinine: 0.92 mg/dL (ref 0.61–1.24)
GFR, Estimated: >60 mL/min (ref >60)
Glucose, Bld: 121 mg/dL — ABNORMAL HIGH (ref 70–99)
Potassium: 3.8 mmol/L (ref 3.5–5.1)
Sodium: 135 mmol/L (ref 135–145)
Total Bilirubin: 0.5 mg/dL (ref 0.3–1.2)
Total Protein: 8.2 g/dL — ABNORMAL HIGH (ref 6.5–8.1)

## 2023-02-09 LAB — LACTATE DEHYDROGENASE: LDH: 632 U/L — ABNORMAL HIGH (ref 98–192)

## 2023-02-09 MED ORDER — RUXOLITINIB PHOSPHATE 10 MG PO TABS: 10.0000 mg | ORAL_TABLET | Freq: Two times a day (BID) | ORAL | 3 refills | Status: DC
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
## 2023-04-11 ENCOUNTER — Telehealth: Payer: Self-pay | Admitting: Hematology

## 2023-05-09 ENCOUNTER — Other Ambulatory Visit: Payer: Self-pay

## 2023-05-09 ENCOUNTER — Telehealth: Payer: Self-pay | Admitting: Hematology

## 2023-05-09 DIAGNOSIS — C946 Myelodysplastic disease, not classified: Secondary | ICD-10-CM

## 2023-05-09 NOTE — Telephone Encounter (Signed)
Patient is unavailable for original date of appointment; patient is aware of rescheduled appointment times/dates

## 2023-05-10 ENCOUNTER — Ambulatory Visit: Payer: BC Managed Care – PPO | Admitting: Hematology

## 2023-05-10 ENCOUNTER — Inpatient Hospital Stay: Payer: BC Managed Care – PPO

## 2023-05-11 ENCOUNTER — Ambulatory Visit: Payer: BC Managed Care – PPO | Admitting: Hematology

## 2023-05-11 ENCOUNTER — Other Ambulatory Visit: Payer: BC Managed Care – PPO

## 2023-06-20 ENCOUNTER — Other Ambulatory Visit: Payer: BC Managed Care – PPO

## 2023-06-20 ENCOUNTER — Ambulatory Visit: Payer: BC Managed Care – PPO | Admitting: Hematology

## 2023-07-25 ENCOUNTER — Telehealth: Payer: Self-pay | Admitting: Hematology

## 2023-07-27 ENCOUNTER — Inpatient Hospital Stay: Payer: BC Managed Care – PPO | Admitting: Hematology

## 2023-07-27 ENCOUNTER — Inpatient Hospital Stay: Payer: BC Managed Care – PPO

## 2023-08-01 ENCOUNTER — Other Ambulatory Visit: Payer: Self-pay

## 2023-08-01 ENCOUNTER — Telehealth: Payer: Self-pay | Admitting: Pharmacy Technician

## 2023-08-01 NOTE — Telephone Encounter (Signed)
Oral Oncology Patient Advocate Encounter   Received notification that prior authorization for Sean Walter is due for renewal.   PA submitted on 08/01/23 Key [id number] Status is pending     Jinger Neighbors, CPhT-Adv Oncology Pharmacy Patient Advocate [facility] Direct Number: [phone]  Fax: [phone]

## 2023-08-02 NOTE — Telephone Encounter (Signed)
Oral Oncology Patient Advocate Encounter  Prior Authorization for Sean Walter has been approved.    PA# [id number] Effective dates: 08/01/23 through 07/31/24  Patient must continue to fill at [facility].    Jinger Neighbors, CPhT-Adv Oncology Pharmacy Patient Advocate [facility] Direct Number: [phone]  Fax: [phone]

## 2023-08-10 ENCOUNTER — Inpatient Hospital Stay (HOSPITAL_BASED_OUTPATIENT_CLINIC_OR_DEPARTMENT_OTHER): Payer: BC Managed Care – PPO | Admitting: Hematology

## 2023-08-10 ENCOUNTER — Inpatient Hospital Stay: Payer: BC Managed Care – PPO | Attending: Hematology

## 2023-08-10 VITALS — BP 143/73 | HR 82 | Temp 97.7°F | Resp 20 | Wt 186.8 lb

## 2023-08-10 DIAGNOSIS — D471 Chronic myeloproliferative disease: Secondary | ICD-10-CM | POA: Diagnosis not present

## 2023-08-10 DIAGNOSIS — C946 Myelodysplastic disease, not classified: Secondary | ICD-10-CM

## 2023-08-10 LAB — CBC WITH DIFFERENTIAL (CANCER CENTER ONLY)
Abs Immature Granulocytes: 0.42 10*3/uL — ABNORMAL HIGH (ref 0.00–0.07)
Basophils Absolute: 0 10*3/uL (ref 0.0–0.1)
Basophils Relative: 0 %
Eosinophils Absolute: 0 10*3/uL (ref 0.0–0.5)
Eosinophils Relative: 0 %
HCT: 37.3 % — ABNORMAL LOW (ref 39.0–52.0)
Hemoglobin: 11.4 g/dL — ABNORMAL LOW (ref 13.0–17.0)
Immature Granulocytes: 7 %
Lymphocytes Relative: 8 %
Lymphs Abs: 0.5 10*3/uL — ABNORMAL LOW (ref 0.7–4.0)
MCH: 25.3 pg — ABNORMAL LOW (ref 26.0–34.0)
MCHC: 30.6 g/dL (ref 30.0–36.0)
MCV: 82.9 fL (ref 80.0–100.0)
Monocytes Absolute: 0.1 10*3/uL (ref 0.1–1.0)
Monocytes Relative: 2 %
Neutro Abs: 5.1 10*3/uL (ref 1.7–7.7)
Neutrophils Relative %: 83 %
Platelet Count: 85 10*3/uL — ABNORMAL LOW (ref 150–400)
RBC: 4.5 MIL/uL (ref 4.22–5.81)
RDW: 15.9 % — ABNORMAL HIGH (ref 11.5–15.5)
WBC Count: 6.1 10*3/uL (ref 4.0–10.5)
nRBC: 0.7 % — ABNORMAL HIGH (ref 0.0–0.2)

## 2023-08-10 LAB — CMP (CANCER CENTER ONLY)
ALT: 14 U/L (ref 0–44)
AST: 17 U/L (ref 15–41)
Albumin: 4 g/dL (ref 3.5–5.0)
Alkaline Phosphatase: 64 U/L (ref 38–126)
Anion gap: 5 (ref 5–15)
BUN: 9 mg/dL (ref 6–20)
CO2: 28 mmol/L (ref 22–32)
Calcium: 9.1 mg/dL (ref 8.9–10.3)
Chloride: 105 mmol/L (ref 98–111)
Creatinine: 1.03 mg/dL (ref 0.61–1.24)
GFR, Estimated: >60 mL/min (ref >60)
Glucose, Bld: 110 mg/dL — ABNORMAL HIGH (ref 70–99)
Potassium: 3.5 mmol/L (ref 3.5–5.1)
Sodium: 138 mmol/L (ref 135–145)
Total Bilirubin: 0.6 mg/dL (ref 0.3–1.2)
Total Protein: 7.7 g/dL (ref 6.5–8.1)

## 2023-08-10 LAB — RETICULOCYTES
Immature Retic Fract: 21.6 % — ABNORMAL HIGH (ref 2.3–15.9)
RBC.: 4.43 MIL/uL (ref 4.22–5.81)
Retic Count, Absolute: 76.6 10*3/uL (ref 19.0–186.0)
Retic Ct Pct: 1.7 % (ref 0.4–3.1)

## 2023-08-10 LAB — LACTATE DEHYDROGENASE: LDH: 590 U/L — ABNORMAL HIGH (ref 98–192)
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
## 2023-10-29 ENCOUNTER — Other Ambulatory Visit: Payer: Self-pay | Admitting: Hematology

## 2023-10-29 DIAGNOSIS — C946 Myelodysplastic disease, not classified: Secondary | ICD-10-CM

## 2023-11-21 ENCOUNTER — Ambulatory Visit (HOSPITAL_COMMUNITY): Payer: BC Managed Care – PPO

## 2023-11-23 ENCOUNTER — Ambulatory Visit (HOSPITAL_COMMUNITY)
Admission: RE | Admit: 2023-11-23 | Discharge: 2023-11-23 | Disposition: A | Discharge status: Home / Self Care | Payer: BC Managed Care – PPO | Source: Ambulatory Visit | Attending: Hematology

## 2023-11-23 DIAGNOSIS — D471 Chronic myeloproliferative disease: Secondary | ICD-10-CM | POA: Diagnosis present

## 2023-11-29 ENCOUNTER — Other Ambulatory Visit: Payer: Self-pay

## 2023-11-29 DIAGNOSIS — D471 Chronic myeloproliferative disease: Secondary | ICD-10-CM

## 2023-11-30 ENCOUNTER — Inpatient Hospital Stay: Payer: BC Managed Care – PPO

## 2023-11-30 ENCOUNTER — Inpatient Hospital Stay: Payer: BC Managed Care – PPO | Attending: Hematology | Admitting: Hematology

## 2024-01-04 ENCOUNTER — Inpatient Hospital Stay: Payer: BC Managed Care – PPO | Admitting: Hematology

## 2024-01-04 ENCOUNTER — Inpatient Hospital Stay: Payer: BC Managed Care – PPO | Attending: Hematology

## 2024-02-22 ENCOUNTER — Ambulatory Visit: Admitting: Hematology

## 2024-02-22 ENCOUNTER — Other Ambulatory Visit

## 2024-03-18 ENCOUNTER — Other Ambulatory Visit: Payer: Self-pay

## 2024-03-18 DIAGNOSIS — D471 Chronic myeloproliferative disease: Secondary | ICD-10-CM
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
## 2024-03-21 ENCOUNTER — Inpatient Hospital Stay: Attending: Hematology

## 2024-03-21 ENCOUNTER — Inpatient Hospital Stay: Admitting: Hematology

## 2024-03-21 ENCOUNTER — Other Ambulatory Visit: Payer: Self-pay | Admitting: Hematology

## 2024-05-08 ENCOUNTER — Other Ambulatory Visit: Payer: Self-pay

## 2024-05-08 DIAGNOSIS — D471 Chronic myeloproliferative disease: Secondary | ICD-10-CM
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
## 2024-05-09 ENCOUNTER — Other Ambulatory Visit (HOSPITAL_COMMUNITY): Payer: Self-pay

## 2024-05-09 ENCOUNTER — Telehealth: Payer: Self-pay

## 2024-05-09 ENCOUNTER — Inpatient Hospital Stay: Attending: Hematology

## 2024-05-09 ENCOUNTER — Ambulatory Visit: Admitting: Hematology

## 2024-05-09 VITALS — BP 123/70 | HR 60 | Temp 97.9°F | Resp 20 | Wt 178.7 lb

## 2024-05-09 DIAGNOSIS — C946 Myelodysplastic disease, not classified: Secondary | ICD-10-CM

## 2024-05-09 DIAGNOSIS — D471 Chronic myeloproliferative disease: Secondary | ICD-10-CM | POA: Insufficient documentation

## 2024-05-09 DIAGNOSIS — F17210 Nicotine dependence, cigarettes, uncomplicated: Secondary | ICD-10-CM | POA: Insufficient documentation

## 2024-05-09 LAB — CBC WITH DIFFERENTIAL (CANCER CENTER ONLY)
Abs Immature Granulocytes: 0.47 K/uL — ABNORMAL HIGH (ref 0.00–0.07)
Basophils Absolute: 0 K/uL (ref 0.0–0.1)
Basophils Relative: 1 %
Eosinophils Absolute: 0 K/uL (ref 0.0–0.5)
Eosinophils Relative: 1 %
HCT: 32.8 % — ABNORMAL LOW (ref 39.0–52.0)
Hemoglobin: 10.2 g/dL — ABNORMAL LOW (ref 13.0–17.0)
Immature Granulocytes: 8 %
Lymphocytes Relative: 12 %
Lymphs Abs: 0.7 K/uL (ref 0.7–4.0)
MCH: 25.6 pg — ABNORMAL LOW (ref 26.0–34.0)
MCHC: 31.1 g/dL (ref 30.0–36.0)
MCV: 82.4 fL (ref 80.0–100.0)
Monocytes Absolute: 0.2 K/uL (ref 0.1–1.0)
Monocytes Relative: 3 %
Neutro Abs: 4.7 K/uL (ref 1.7–7.7)
Neutrophils Relative %: 75 %
Platelet Count: 68 K/uL — ABNORMAL LOW (ref 150–400)
RBC: 3.98 MIL/uL — ABNORMAL LOW (ref 4.22–5.81)
RDW: 18.5 % — ABNORMAL HIGH (ref 11.5–15.5)
WBC Count: 6.2 K/uL (ref 4.0–10.5)
nRBC: 1.8 % — ABNORMAL HIGH (ref 0.0–0.2)

## 2024-05-09 LAB — CMP (CANCER CENTER ONLY)
ALT: 12 U/L (ref 0–44)
AST: 17 U/L (ref 15–41)
Albumin: 3.6 g/dL (ref 3.5–5.0)
Alkaline Phosphatase: 76 U/L (ref 38–126)
Anion gap: 4 — ABNORMAL LOW (ref 5–15)
BUN: 8 mg/dL (ref 6–20)
CO2: 30 mmol/L (ref 22–32)
Calcium: 8.6 mg/dL — ABNORMAL LOW (ref 8.9–10.3)
Chloride: 103 mmol/L (ref 98–111)
Creatinine: 1.02 mg/dL (ref 0.61–1.24)
GFR, Estimated: >60 mL/min (ref >60)
Glucose, Bld: 105 mg/dL — ABNORMAL HIGH (ref 70–99)
Potassium: 3.6 mmol/L (ref 3.5–5.1)
Sodium: 137 mmol/L (ref 135–145)
Total Bilirubin: 0.6 mg/dL (ref 0.0–1.2)
Total Protein: 7.5 g/dL (ref 6.5–8.1)

## 2024-05-09 LAB — RETICULOCYTES
Immature Retic Fract: 25.5 % — ABNORMAL HIGH (ref 2.3–15.9)
RBC.: 4.04 MIL/uL — ABNORMAL LOW (ref 4.22–5.81)
Retic Count, Absolute: 116.8 K/uL (ref 19.0–186.0)
Retic Ct Pct: 2.9 % (ref 0.4–3.1)

## 2024-05-09 LAB — LACTATE DEHYDROGENASE: LDH: 573 U/L — ABNORMAL HIGH (ref 98–192)

## 2024-05-09 MED ORDER — VONJO 100 MG PO CAPS
100.0000 mg | ORAL_CAPSULE | Freq: Two times a day (BID) | ORAL | 1 refills | Status: DC
  Filled 2024-05-19: qty 60, 30d supply, fill #0
  Filled 2024-06-12: qty 60, 30d supply, fill #1

## 2024-05-09 NOTE — Telephone Encounter (Addendum)
 Oral Oncology Pharmacist Encounter  Received new prescription for Vonjo  (pacritinib ) for the treatment of myelofibrosis, planned duration until disease progression or unacceptable toxicity.  Labs from 05/09/24 (CBC and CMP and EKG) assessed, no interventions needed. Prescription dose and frequency assessed for appropriateness.   Current medication list in Epic reviewed, no significant/ relevant DDIs with Vonjo  identified. To note, patient has not filled buprenorphine - naloxone since 02/2023.   Evaluated chart and some patient barriers to medication adherence noted.   Patient agreement for treatment documented in MD note on 05/09/2024.  Prescription has been e-scribed to the [facility] for benefits analysis and approval.  Oral Oncology Clinic will continue to follow for insurance authorization, copayment issues, initial counseling and start date.  Kimberlin Scheel, PharmD Hematology/Oncology Clinical Pharmacist [facility] Oral Chemotherapy Navigation Clinic [phone] 05/09/2024 3:09 PM

## 2024-05-09 NOTE — Telephone Encounter (Signed)
 Oral Oncology Patient Advocate Encounter   Received notification that prior authorization for Vonjo is required.   PA submitted on 05/09/24 Key [id number] Status is pending      Charlott Hamilton,  CPhT-Adv  she/her/hers [facility]  [facility] Specialty Pharmacy Services Pharmacy Technician Patient Advocate Specialist III [facility] Phone: [phone]  Fax: [phone] [email or web address]

## 2024-05-12 ENCOUNTER — Other Ambulatory Visit (HOSPITAL_COMMUNITY): Payer: Self-pay

## 2024-05-12 ENCOUNTER — Other Ambulatory Visit (HOSPITAL_BASED_OUTPATIENT_CLINIC_OR_DEPARTMENT_OTHER): Payer: Self-pay

## 2024-05-12 NOTE — Telephone Encounter (Signed)
 Oral Oncology Patient Advocate Encounter  Prior Authorization for Vonjo  has been approved.    PA# [id number]  Effective dates: 05/12/24 through [id number]  Patients co-pay is $5.00.    Lucie Lamer, CPhT  she/her/hers [place]  [facility] Specialty Pharmacy Services Pharmacy Technician Patient Advocate Specialist II [facility] Phone: [phone]  Fax: [phone] [email or web address]

## 2024-05-19 ENCOUNTER — Other Ambulatory Visit (HOSPITAL_COMMUNITY): Payer: Self-pay

## 2024-05-19 ENCOUNTER — Other Ambulatory Visit: Payer: Self-pay

## 2024-05-19 NOTE — Progress Notes (Signed)
 Specialty Pharmacy Initial Fill Coordination Note  KHAALID LEFKOWITZ is a 53 y.o. male contacted today regarding refills of specialty medication(s) Pacritinib  Citrate (Vonjo ) .  Patient requested Delivery  on 05/21/24  to verified address [address]  [place] [address]   Medication will be filled on 05/19/24.   Patient is aware of $5.00 copayment.

## 2024-05-19 NOTE — Progress Notes (Signed)
 Patient counseled on Vonjo  in telephone encounter opened on 05/09/24.  Patient is unable to have phone at work and will be difficult to reach during normal hours. Would recommend late phone calls for refills.  Ivah Girardot, PharmD Hematology/Oncology Clinical Pharmacist [facility] Oral Chemotherapy Navigation Clinic [phone]

## 2024-05-19 NOTE — Telephone Encounter (Signed)
 Oral Chemotherapy Pharmacist Encounter  I spoke with patient for overview of: Vonjo  (pacritinib ) for the treatment of myelofibrosis, planned duration until disease progression or unacceptable toxicity.   Counseled patient on administration, dosing, side effects, monitoring, drug-food interactions, safe handling, storage, and disposal.  Patient will take Vonjo  100mg  capsules, 1 capsule (100mg ) by mouth twice daily without regard to food. Patient knows to avoid grapefruit or grapefruit juice while on Vonjo .  Vonjo  start date: 05/21/24  Adverse effects include but are not limited to: diarrhea, nausea/vomiting, decreased blood counts. Patient instructed about risk of Qtc prolongation with Vonjo  treatment.  Patient aware Vonjo  will need to be held at least 7 days prior to any planned elective surgeries to decrease bleed risk.  Patient will obtain an anti-diarrheal agent such as loperamide prior to starting therapy.   Reviewed with patient importance of keeping a medication schedule and plan for any missed doses. No barriers to medication adherence identified.  Medication reconciliation performed and medication/allergy list updated.  All questions answered. Patient voiced understanding and appreciation.   Medication education handout placed in mail for patient. Patient knows to call the office with questions or concerns. Oral Chemotherapy Clinic phone number provided to patient.   Azriel Jakob, PharmD Hematology/Oncology Clinical Pharmacist [facility] Oral Chemotherapy Navigation Clinic [phone] 05/19/2024   7:47 PM

## 2024-06-09 ENCOUNTER — Other Ambulatory Visit: Payer: Self-pay

## 2024-06-12 ENCOUNTER — Other Ambulatory Visit (HOSPITAL_COMMUNITY): Payer: Self-pay

## 2024-06-13 ENCOUNTER — Inpatient Hospital Stay: Attending: Hematology | Admitting: Hematology

## 2024-06-13 ENCOUNTER — Other Ambulatory Visit (HOSPITAL_COMMUNITY): Payer: Self-pay

## 2024-06-13 ENCOUNTER — Inpatient Hospital Stay

## 2024-06-13 ENCOUNTER — Other Ambulatory Visit: Payer: Self-pay

## 2024-06-13 NOTE — Progress Notes (Signed)
 Patient with >5 no shows. Please do not reschedule with me. Needs to change providers. Has been referred to [facility] already. Vonjo  discontinued    This encounter was created in error - please disregard.

## 2024-06-16 ENCOUNTER — Other Ambulatory Visit (HOSPITAL_COMMUNITY): Payer: Self-pay

## 2024-06-16 ENCOUNTER — Other Ambulatory Visit: Payer: Self-pay

## 2024-06-16 NOTE — Progress Notes (Signed)
 Disenrolling patient from [facility].   Per 06/13/24 OV encounter: Patient with >5 no shows. Please do not reschedule with me. Needs to change providers. Has been referred to [facility] already. Vonjo  discontinued. Ok to disenroll per [employer].

## 2024-06-17 ENCOUNTER — Other Ambulatory Visit: Payer: Self-pay

## 2024-07-14 ENCOUNTER — Other Ambulatory Visit: Payer: Self-pay

## 2024-07-14 ENCOUNTER — Other Ambulatory Visit (HOSPITAL_COMMUNITY): Payer: Self-pay

## 2024-08-08 ENCOUNTER — Inpatient Hospital Stay: Attending: Hematology | Admitting: Hematology

## 2024-08-08 ENCOUNTER — Inpatient Hospital Stay: Attending: Hematology

## 2024-08-08 VITALS — BP 138/86 | HR 76 | Temp 97.6°F | Resp 17 | Wt 174.0 lb

## 2024-08-08 DIAGNOSIS — D471 Chronic myeloproliferative disease: Secondary | ICD-10-CM | POA: Insufficient documentation

## 2024-08-08 DIAGNOSIS — F17210 Nicotine dependence, cigarettes, uncomplicated: Secondary | ICD-10-CM | POA: Diagnosis not present

## 2024-08-08 DIAGNOSIS — I10 Essential (primary) hypertension: Secondary | ICD-10-CM | POA: Diagnosis not present

## 2024-08-08 DIAGNOSIS — Z79899 Other long term (current) drug therapy: Secondary | ICD-10-CM | POA: Diagnosis not present

## 2024-08-08 DIAGNOSIS — M542 Cervicalgia: Secondary | ICD-10-CM | POA: Diagnosis not present

## 2024-08-08 LAB — CBC WITH DIFFERENTIAL (CANCER CENTER ONLY)
Abs Immature Granulocytes: 0.6 K/uL — ABNORMAL HIGH (ref 0.00–0.07)
Basophils Absolute: 0 K/uL (ref 0.0–0.1)
Basophils Relative: 1 %
Eosinophils Absolute: 0 K/uL (ref 0.0–0.5)
Eosinophils Relative: 0 %
HCT: 36.2 % — ABNORMAL LOW (ref 39.0–52.0)
Hemoglobin: 11.4 g/dL — ABNORMAL LOW (ref 13.0–17.0)
Immature Granulocytes: 7 %
Lymphocytes Relative: 8 %
Lymphs Abs: 0.7 K/uL (ref 0.7–4.0)
MCH: 24.7 pg — ABNORMAL LOW (ref 26.0–34.0)
MCHC: 31.5 g/dL (ref 30.0–36.0)
MCV: 78.5 fL — ABNORMAL LOW (ref 80.0–100.0)
Monocytes Absolute: 0.2 K/uL (ref 0.1–1.0)
Monocytes Relative: 2 %
Neutro Abs: 6.7 K/uL (ref 1.7–7.7)
Neutrophils Relative %: 82 %
Platelet Count: 76 K/uL — ABNORMAL LOW (ref 150–400)
RBC: 4.61 MIL/uL (ref 4.22–5.81)
RDW: 17.9 % — ABNORMAL HIGH (ref 11.5–15.5)
WBC Count: 8.2 K/uL (ref 4.0–10.5)
nRBC: 0.7 % — ABNORMAL HIGH (ref 0.0–0.2)

## 2024-08-08 LAB — RETICULOCYTES
Immature Retic Fract: 26.5 % — ABNORMAL HIGH (ref 2.3–15.9)
RBC.: 4.55 MIL/uL (ref 4.22–5.81)
Retic Count, Absolute: 88.3 K/uL (ref 19.0–186.0)
Retic Ct Pct: 1.9 % (ref 0.4–3.1)

## 2024-08-08 LAB — CMP (CANCER CENTER ONLY)
ALT: 10 U/L (ref 0–44)
AST: 17 U/L (ref 15–41)
Albumin: 3.8 g/dL (ref 3.5–5.0)
Alkaline Phosphatase: 85 U/L (ref 38–126)
Anion gap: 5 (ref 5–15)
BUN: 8 mg/dL (ref 6–20)
CO2: 28 mmol/L (ref 22–32)
Calcium: 9.2 mg/dL (ref 8.9–10.3)
Chloride: 101 mmol/L (ref 98–111)
Creatinine: 0.85 mg/dL (ref 0.61–1.24)
GFR, Estimated: >60 mL/min (ref >60)
Glucose, Bld: 110 mg/dL — ABNORMAL HIGH (ref 70–99)
Potassium: 3.9 mmol/L (ref 3.5–5.1)
Sodium: 134 mmol/L — ABNORMAL LOW (ref 135–145)
Total Bilirubin: 0.7 mg/dL (ref 0.0–1.2)
Total Protein: 7.9 g/dL (ref 6.5–8.1)

## 2024-08-08 LAB — LACTATE DEHYDROGENASE: LDH: 553 U/L — ABNORMAL HIGH (ref 98–192)
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
## 2024-08-21 ENCOUNTER — Other Ambulatory Visit: Payer: Self-pay

## 2024-08-21 DIAGNOSIS — D471 Chronic myeloproliferative disease: Secondary | ICD-10-CM
